# Patient Record
Sex: Male | Born: 1950 | Race: White | Hispanic: No | Marital: Married | Smoking: Former smoker
Health system: Southern US, Community
[De-identification: ages and names within clinical notes are randomized; demographics above are authoritative.]

## PROBLEM LIST (undated history)

## (undated) DIAGNOSIS — C649 Malignant neoplasm of unspecified kidney, except renal pelvis: Secondary | ICD-10-CM

## (undated) DIAGNOSIS — E785 Hyperlipidemia, unspecified: Secondary | ICD-10-CM

## (undated) DIAGNOSIS — R011 Cardiac murmur, unspecified: Secondary | ICD-10-CM

## (undated) DIAGNOSIS — E039 Hypothyroidism, unspecified: Secondary | ICD-10-CM

## (undated) DIAGNOSIS — N400 Enlarged prostate without lower urinary tract symptoms: Secondary | ICD-10-CM

## (undated) HISTORY — PX: KNEE SURGERY: SHX244

## (undated) HISTORY — PX: SPINE SURGERY: SHX786

## (undated) HISTORY — PX: TONSILECTOMY/ADENOIDECTOMY WITH MYRINGOTOMY: SHX6125

## (undated) HISTORY — PX: FEMUR SURGERY: SHX943

---

## 2014-03-23 DIAGNOSIS — N529 Male erectile dysfunction, unspecified: Secondary | ICD-10-CM | POA: Insufficient documentation

## 2016-11-22 DIAGNOSIS — R319 Hematuria, unspecified: Secondary | ICD-10-CM | POA: Insufficient documentation

## 2017-03-12 DIAGNOSIS — F418 Other specified anxiety disorders: Secondary | ICD-10-CM | POA: Insufficient documentation

## 2019-11-18 DIAGNOSIS — E785 Hyperlipidemia, unspecified: Secondary | ICD-10-CM | POA: Insufficient documentation

## 2019-11-18 DIAGNOSIS — N400 Enlarged prostate without lower urinary tract symptoms: Secondary | ICD-10-CM | POA: Insufficient documentation

## 2019-11-26 DIAGNOSIS — C649 Malignant neoplasm of unspecified kidney, except renal pelvis: Secondary | ICD-10-CM | POA: Insufficient documentation

## 2020-03-04 ENCOUNTER — Telehealth: Payer: Self-pay | Admitting: Hematology & Oncology

## 2020-03-04 NOTE — Telephone Encounter (Signed)
Called and left detailed message for patient regarding appointments scheduled.  I asked that he call back to confirm date & time . New patient packet has been mailed to his [place] address

## 2020-03-10 ENCOUNTER — Telehealth: Payer: Self-pay

## 2020-03-10 NOTE — Telephone Encounter (Signed)
Pt called in to cancel his New Pt ref as he has not moved her just yet, he feels that he will be here closer to May/June and he will call back to make a new appt at that time     Lotoya Casella

## 2020-04-05 ENCOUNTER — Telehealth: Payer: Self-pay

## 2020-04-05 NOTE — Telephone Encounter (Signed)
Pt called in to r/s his appt as she now has a confirmed move in date for the area    anne

## 2020-04-13 DIAGNOSIS — E039 Hypothyroidism, unspecified: Secondary | ICD-10-CM | POA: Insufficient documentation

## 2020-04-13 DIAGNOSIS — R6884 Jaw pain: Secondary | ICD-10-CM | POA: Insufficient documentation

## 2020-04-19 ENCOUNTER — Ambulatory Visit: Payer: Self-pay | Admitting: Hematology & Oncology

## 2020-04-19 ENCOUNTER — Other Ambulatory Visit: Payer: Self-pay

## 2020-05-11 DIAGNOSIS — R9389 Abnormal findings on diagnostic imaging of other specified body structures: Secondary | ICD-10-CM | POA: Insufficient documentation

## 2020-05-11 DIAGNOSIS — I959 Hypotension, unspecified: Secondary | ICD-10-CM | POA: Insufficient documentation

## 2020-05-31 ENCOUNTER — Encounter: Payer: Self-pay | Admitting: Hematology & Oncology

## 2020-05-31 ENCOUNTER — Inpatient Hospital Stay: Payer: Medicare Other

## 2020-05-31 ENCOUNTER — Inpatient Hospital Stay: Payer: Medicare Other | Attending: Hematology & Oncology | Admitting: Hematology & Oncology

## 2020-05-31 ENCOUNTER — Other Ambulatory Visit: Payer: Self-pay

## 2020-05-31 VITALS — BP 109/69 | HR 73 | Temp 98.2°F | Resp 18 | Ht 75.0 in | Wt 203.0 lb

## 2020-05-31 DIAGNOSIS — C7951 Secondary malignant neoplasm of bone: Secondary | ICD-10-CM | POA: Insufficient documentation

## 2020-05-31 DIAGNOSIS — Z87891 Personal history of nicotine dependence: Secondary | ICD-10-CM | POA: Diagnosis not present

## 2020-05-31 DIAGNOSIS — Z5112 Encounter for antineoplastic immunotherapy: Secondary | ICD-10-CM | POA: Insufficient documentation

## 2020-05-31 DIAGNOSIS — M25551 Pain in right hip: Secondary | ICD-10-CM | POA: Diagnosis not present

## 2020-05-31 DIAGNOSIS — C689 Malignant neoplasm of urinary organ, unspecified: Secondary | ICD-10-CM

## 2020-05-31 DIAGNOSIS — Z923 Personal history of irradiation: Secondary | ICD-10-CM | POA: Diagnosis not present

## 2020-05-31 DIAGNOSIS — C641 Malignant neoplasm of right kidney, except renal pelvis: Secondary | ICD-10-CM | POA: Insufficient documentation

## 2020-05-31 DIAGNOSIS — Z7189 Other specified counseling: Secondary | ICD-10-CM

## 2020-05-31 HISTORY — DX: Other specified counseling: Z71.89

## 2020-05-31 LAB — CBC WITH DIFFERENTIAL (CANCER CENTER ONLY)
Abs Immature Granulocytes: 0.02 10*3/uL (ref 0.00–0.07)
Basophils Absolute: 0 10*3/uL (ref 0.0–0.1)
Basophils Relative: 1 %
Eosinophils Absolute: 0.2 10*3/uL (ref 0.0–0.5)
Eosinophils Relative: 3 %
HCT: 34.2 % — ABNORMAL LOW (ref 39.0–52.0)
Hemoglobin: 11 g/dL — ABNORMAL LOW (ref 13.0–17.0)
Immature Granulocytes: 0 %
Lymphocytes Relative: 14 %
Lymphs Abs: 0.8 10*3/uL (ref 0.7–4.0)
MCH: 29.9 pg (ref 26.0–34.0)
MCHC: 32.2 g/dL (ref 30.0–36.0)
MCV: 92.9 fL (ref 80.0–100.0)
Monocytes Absolute: 0.4 10*3/uL (ref 0.1–1.0)
Monocytes Relative: 6 %
Neutro Abs: 4.5 10*3/uL (ref 1.7–7.7)
Neutrophils Relative %: 76 %
Platelet Count: 186 10*3/uL (ref 150–400)
RBC: 3.68 MIL/uL — ABNORMAL LOW (ref 4.22–5.81)
RDW: 17.5 % — ABNORMAL HIGH (ref 11.5–15.5)
WBC Count: 5.9 10*3/uL (ref 4.0–10.5)
nRBC: 0 % (ref 0.0–0.2)

## 2020-05-31 LAB — CMP (CANCER CENTER ONLY)
ALT: 11 U/L (ref 0–44)
AST: 8 U/L — ABNORMAL LOW (ref 15–41)
Albumin: 3.8 g/dL (ref 3.5–5.0)
Alkaline Phosphatase: 75 U/L (ref 38–126)
Anion gap: 5 (ref 5–15)
BUN: 26 mg/dL — ABNORMAL HIGH (ref 8–23)
CO2: 28 mmol/L (ref 22–32)
Calcium: 10.2 mg/dL (ref 8.9–10.3)
Chloride: 107 mmol/L (ref 98–111)
Creatinine: 0.77 mg/dL (ref 0.61–1.24)
GFR, Estimated: >60 mL/min (ref >60)
Glucose, Bld: 93 mg/dL (ref 70–99)
Potassium: 4.3 mmol/L (ref 3.5–5.1)
Sodium: 140 mmol/L (ref 135–145)
Total Bilirubin: 0.5 mg/dL (ref 0.3–1.2)
Total Protein: 6.1 g/dL — ABNORMAL LOW (ref 6.5–8.1)

## 2020-05-31 LAB — LACTATE DEHYDROGENASE: LDH: 148 U/L (ref 98–192)

## 2020-05-31 NOTE — Progress Notes (Signed)
Referral MD  Reason for Referral: Metastatic renal cell carcinoma-right kidney-bony metastasis  Chief Complaint  Patient presents with  . New Patient (Initial Visit)  : I just moved to [place].  I am transferring my care from [facility].  HPI: [name] is a very nice 70 year old white male.  He is originally from [place].  He comes in with his wife.  She is lovely and charming.  He has been quite healthy.  Apparently, he was diagnosed last year with metastatic renal cell carcinoma.  He initially presented with hematuria.  This was back in May 2021.  He was seen by urology.  Unfortunate, nothing could be found on multiple exams.  In September 2021, he had more follow-up.  He had urine cytology which was unremarkable.  However, in October 2021, he had a CT scan which showed a lesion in the right parasymphyseal region.  This measured 4 cm.  There was a moderate soft tissue component.  Also noted was a suspicious lesion in the anterior right femoral neck.  He is having a lot of pain in the right hip.  This was with weightbearing.  He subsequently was seen by medical oncology.  He underwent a biopsy which was felt to be consistent with a renal cell carcinoma.  Again there was no tissue to be able to further identify this.  It was a poorly differentiated carcinoma.  He had a PET scan done.  The PET scan was quite positive for bony metastasis.  He was seen by orthopedic surgery.  Did not feel that he required any initial surgery for the right hip area.  He was given radiation therapy.  He was started on immunotherapy with nivolumab and ipilimumab.  He had 4 cycles of both agents and then has been on single agent nivolumab.  He is tolerated this quite well.  In November, he was seen by spinal surgery.  He had a lesion at T5.  He was having some radicular pain.  There is no weakness.  However, it was felt that he was a significant risk for cord compression.  He subsequently underwent a T5-6  corpectomy and a T3-T7 fusion.  This was done on 01/15/2020.  He got through the surgery quite nicely.  Most recently, he had surgery to fix a cortical lesion in the right femur.  This was done on 05/13/2020.  The surgeon did a remarkable job.  There is such a small incision over on the right lateral thigh.  Again, he has had multiple radiation treatments to his bones.  It is apparent that his disease really has a predilection for his bones.  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  This was in the mandibular area.  He has been seen by periodontist.  Is been seen by oral surgery.  He still has his lesion.  We will have to see about referring him to our dentist.  He and his wife are now moved to the [place] area.  They are transferring their care to the [facility].  He looks fantastic.  He feels good.  Has had no problems with the immunotherapy.  He has had no problems with cough or shortness of breath.  There is been no nausea or vomiting.  He has had some fatigue.  He has had no count of rashes.  He has had no headache.  Overall, I  would have to say his performance status is ECOG 1.   History reviewed. No pertinent past medical history.:  History reviewed. No pertinent surgical history.:   Current Outpatient Medications:  .  ascorbic acid (VITAMIN C) 500 MG tablet, Take 500 mg by mouth in the morning., Disp: , Rfl:  .  guaiFENesin (MUCINEX) 600 MG 12 hr tablet, Take 600 mg by mouth in the morning and at bedtime., Disp: , Rfl:  .  senna-docusate (SENOKOT-S) 8.6-50 MG tablet, Take by mouth., Disp: , Rfl:  .  vitamin E 180 MG (400 UNITS) capsule, Take 400 Units by mouth every morning., Disp: , Rfl:  .  acetaminophen (TYLENOL) 325 MG tablet, Take 975 mg by mouth every 8 (eight) hours., Disp: , Rfl:  .  alfuzosin (UROXATRAL) 10 MG 24 hr tablet, Take 10 mg by mouth at bedtime., Disp:  , Rfl:  .  ASPIRIN LOW DOSE 81 MG EC tablet, Take 81 mg by mouth in the morning and at bedtime., Disp: , Rfl:  .  escitalopram (LEXAPRO) 20 MG tablet, Take 1 tablet by mouth daily., Disp: , Rfl:  .  finasteride (PROSCAR) 5 MG tablet, Take 5 mg by mouth daily., Disp: , Rfl:  .  Hypertonic Nasal Wash (SINUS RINSE) PACK, Irrigate with as directed., Disp: , Rfl:  .  ipilimumab (YERVOY) 200 MG/40ML SOLN, Inject into the vein., Disp: , Rfl:  .  LORazepam (ATIVAN) 1 MG tablet, Take 1 mg by mouth once., Disp: , Rfl:  .  magic mouthwash (lidocaine, diphenhydrAMINE, alum & mag hydroxide) suspension, Swish and spit 5 mLs every 6 (six) hours as needed for Pain, Disp: , Rfl:  .  MAGNESIUM CARBONATE PO, Take 325 mg by mouth daily., Disp: , Rfl:  .  melatonin 3 MG TABS tablet, Take 3 mg by mouth at bedtime., Disp: , Rfl:  .  midodrine (PROAMATINE) 2.5 MG tablet, Take 2.5 mg by mouth 3 (three) times daily., Disp: , Rfl:  .  montelukast (SINGULAIR) 10 MG tablet, Take 1 tablet by mouth daily., Disp: , Rfl:  .  Multiple Vitamin (MULTI-VITAMINS) TABS, Take 1 tablet by mouth daily., Disp: , Rfl:  .  nivolumab (OPDIVO) 100 MG/10ML SOLN chemo injection, Inject into the vein., Disp: , Rfl:  .  Omega-3 Fatty Acids (FISH OIL PO), Take 5 mLs by mouth daily., Disp: , Rfl:  .  ondansetron (ZOFRAN-ODT) 4 MG disintegrating tablet, Take 4 mg by mouth every 8 (eight) hours as needed., Disp: , Rfl:  .  polyethylene glycol powder (GLYCOLAX/MIRALAX) 17 GM/SCOOP powder, Take 17 g by mouth at bedtime., Disp: , Rfl:  .  Potassium Citrate-Citric Acid 3300-1002 MG PACK, Take by mouth., Disp: , Rfl:  .  predniSONE (DELTASONE) 10 MG tablet, Take 10 mg by mouth daily., Disp: , Rfl:  .  prochlorperazine (COMPAZINE) 10 MG tablet, Take 10 mg by mouth every 6 (six) hours as needed., Disp: , Rfl:  .  propranolol (INDERAL) 10 MG tablet, Take 10 mg by mouth daily as needed., Disp: , Rfl: :  :  Allergies  Allergen Reactions  . Morphine  Nausea Only  :  History reviewed. No pertinent family history.:  Social History   Socioeconomic History  . Marital status: Married    Spouse name: Not on file  . Number of children: Not on file  . Years of education: Not on file  . Highest education level: Not on file  Occupational History  . Not on file  Tobacco Use  .  Smoking status: Former Smoker    Packs/day: 0.50    Years: 25.00    Pack years: 12.50    Types: Cigarettes    Quit date: 01/27/1997    Years since quitting: 23.3  . Smokeless tobacco: Never Used  Substance and Sexual Activity  . Alcohol use: Not on file  . Drug use: Not on file  . Sexual activity: Not on file  Other Topics Concern  . Not on file  Social History Narrative  . Not on file   Social Determinants of Health   Financial Resource Strain: Not on file  Food Insecurity: Not on file  Transportation Needs: Not on file  Physical Activity: Not on file  Stress: Not on file  Social Connections: Not on file  Intimate Partner Violence: Not on file  :  Review of Systems  Constitutional: Negative.   HENT: Negative.  Negative for hearing loss.   Eyes: Negative.   Respiratory: Negative.   Cardiovascular: Negative.   Gastrointestinal: Negative.   Genitourinary: Negative.   Musculoskeletal: Negative.   Skin: Negative.   Neurological: Negative.   Endo/Heme/Allergies: Negative.   Psychiatric/Behavioral: Negative.     Exam:  This is a well-developed and well-nourished white male in no obvious distress.  Vital signs are temperature of 98.2.  Pulse 73.  Blood pressure 109/69.  Weight is 203 pounds.  Head and neck exam shows no ocular or oral lesions.  He has no palpable cervical or supraclavicular lymph nodes.  Lungs are clear bilaterally.  Cardiac exam regular rate and rhythm with no murmurs, rubs or bruits.  There may be 1/6 systolic ejection murmur.  Abdomen is soft.  Has good bowel sounds.  There is no fluid wave.  There is no palpable liver or  spleen tip.  Back exam shows the laminectomy scar in the upper thoracic spine.  This is well-healed.  He has no tenderness over the spine, ribs or hips.  Extremities shows no clubbing, cyanosis or edema.  He has good range of motion of his joints.  He has good strength in upper and lower extremities.  Neurological exam shows no focal neurological deficits.  Skin exam shows no rashes, ecchymoses or petechia.   @IPVITALS @   Recent Labs    05/31/20 1047  WBC 5.9  HGB 11.0*  HCT 34.2*  PLT 186   Recent Labs    05/31/20 1047  NA 140  K 4.3  CL 107  CO2 28  GLUCOSE 93  BUN 26*  CREATININE 0.77  CALCIUM 10.2    Blood smear review: None  Pathology: None  Assessment and Plan: Mr. Enderle is a very nice 70 year old white male.  He has metastatic renal cell carcinoma.  He is doing incredibly well with this.  I must say that he has had incredibly aggressive care.  He has had multiple surgeries.  He has had radiation therapy.  He has had immunotherapy.  Clearly, this is all helped him.  He has had a great quality of life.  It seems like he has responded very nicely.  We will continue him on the nivolumab.  This certainly can have a long-lasting effect which should be quite beneficial for him.  Will have to make the referral for dentistry.  No much was going on with his mouth and this ulcer.  I am sure our dentist will be able to help [place] out.  He was on Xgeva.  We are holding this until we know exactly what is going on  with his mouth.  Again, he looks quite good.  He and his wife are incredibly eloquent.  It was a lot of fun talking with him.  We will start the nivolumab next week.  This will be every 4 weeks.  If we see that he is responding, then we might be able to move his infusions out a little bit longer.  He still goes back to [facility] to see the surgeons.  This I think is a fantastic idea.  I will plan to see him back myself when he has his second round of the nivolumab.  I will  have to figure out when we need to do another set of scans on him.

## 2020-06-01 ENCOUNTER — Telehealth: Payer: Self-pay

## 2020-06-01 NOTE — Telephone Encounter (Signed)
Called and left a vm with appts per 05/31/20 los   Francis Cunningham

## 2020-06-03 ENCOUNTER — Telehealth (HOSPITAL_COMMUNITY): Payer: Self-pay

## 2020-06-03 NOTE — Telephone Encounter (Signed)
I left message on patients answering machine to return call to Dental Medicine to schedule an appt at the request of Dr. Marin Olp.

## 2020-06-07 ENCOUNTER — Inpatient Hospital Stay: Payer: Medicare Other

## 2020-06-07 ENCOUNTER — Other Ambulatory Visit: Payer: Self-pay

## 2020-06-07 VITALS — BP 98/60 | HR 80 | Temp 98.2°F | Resp 18

## 2020-06-07 DIAGNOSIS — C641 Malignant neoplasm of right kidney, except renal pelvis: Secondary | ICD-10-CM

## 2020-06-07 DIAGNOSIS — Z5112 Encounter for antineoplastic immunotherapy: Secondary | ICD-10-CM | POA: Diagnosis not present

## 2020-06-07 LAB — CBC WITH DIFFERENTIAL (CANCER CENTER ONLY)
Abs Immature Granulocytes: 0.01 10*3/uL (ref 0.00–0.07)
Basophils Absolute: 0 10*3/uL (ref 0.0–0.1)
Basophils Relative: 1 %
Eosinophils Absolute: 0.3 10*3/uL (ref 0.0–0.5)
Eosinophils Relative: 5 %
HCT: 36.8 % — ABNORMAL LOW (ref 39.0–52.0)
Hemoglobin: 11.8 g/dL — ABNORMAL LOW (ref 13.0–17.0)
Immature Granulocytes: 0 %
Lymphocytes Relative: 15 %
Lymphs Abs: 1 10*3/uL (ref 0.7–4.0)
MCH: 29.9 pg (ref 26.0–34.0)
MCHC: 32.1 g/dL (ref 30.0–36.0)
MCV: 93.4 fL (ref 80.0–100.0)
Monocytes Absolute: 0.6 10*3/uL (ref 0.1–1.0)
Monocytes Relative: 8 %
Neutro Abs: 4.9 10*3/uL (ref 1.7–7.7)
Neutrophils Relative %: 71 %
Platelet Count: 194 10*3/uL (ref 150–400)
RBC: 3.94 MIL/uL — ABNORMAL LOW (ref 4.22–5.81)
RDW: 17.9 % — ABNORMAL HIGH (ref 11.5–15.5)
WBC Count: 6.8 10*3/uL (ref 4.0–10.5)
nRBC: 0 % (ref 0.0–0.2)

## 2020-06-07 LAB — CMP (CANCER CENTER ONLY)
ALT: 11 U/L (ref 0–44)
AST: 8 U/L — ABNORMAL LOW (ref 15–41)
Albumin: 4 g/dL (ref 3.5–5.0)
Alkaline Phosphatase: 73 U/L (ref 38–126)
Anion gap: 4 — ABNORMAL LOW (ref 5–15)
BUN: 29 mg/dL — ABNORMAL HIGH (ref 8–23)
CO2: 29 mmol/L (ref 22–32)
Calcium: 10.9 mg/dL — ABNORMAL HIGH (ref 8.9–10.3)
Chloride: 107 mmol/L (ref 98–111)
Creatinine: 0.87 mg/dL (ref 0.61–1.24)
GFR, Estimated: >60 mL/min (ref >60)
Glucose, Bld: 111 mg/dL — ABNORMAL HIGH (ref 70–99)
Potassium: 4.3 mmol/L (ref 3.5–5.1)
Sodium: 140 mmol/L (ref 135–145)
Total Bilirubin: 0.5 mg/dL (ref 0.3–1.2)
Total Protein: 6.2 g/dL — ABNORMAL LOW (ref 6.5–8.1)

## 2020-06-07 LAB — LACTATE DEHYDROGENASE: LDH: 135 U/L (ref 98–192)

## 2020-06-07 MED ORDER — SODIUM CHLORIDE 0.9 % IV SOLN
480.0000 mg | Freq: Once | INTRAVENOUS | Status: AC
  Administered 2020-06-07: 480 mg via INTRAVENOUS
  Filled 2020-06-07: qty 48

## 2020-06-07 MED ORDER — SODIUM CHLORIDE 0.9 % IV SOLN
Freq: Once | INTRAVENOUS | Status: AC
Start: 2020-06-07 — End: 2020-06-07
  Filled 2020-06-07: qty 250

## 2020-06-07 NOTE — Patient Instructions (Signed)
[facility]  Discharge Instructions: Thank you for choosing [facility] to provide your oncology and hematology care.   If you have a lab appointment with the [facility], please go directly to the [facility] and check in at the registration area.  Wear comfortable clothing and clothing appropriate for easy access to any Portacath or PICC line.   We strive to give you quality time with your provider. You may need to reschedule your appointment if you arrive late (15 or more minutes).  Arriving late affects you and other patients whose appointments are after yours.  Also, if you miss three or more appointments without notifying the office, you may be dismissed from the clinic at the provider's discretion.      For prescription refill requests, have your pharmacy contact our office and allow 72 hours for refills to be completed.    Today you received the following chemotherapy and/or immunotherapy agents opdivo    To help prevent nausea and vomiting after your treatment, we encourage you to take your nausea medication as directed.  BELOW ARE SYMPTOMS THAT SHOULD BE REPORTED IMMEDIATELY: . *FEVER GREATER THAN 100.4 F (38 C) OR HIGHER . *CHILLS OR SWEATING . *NAUSEA AND VOMITING THAT IS NOT CONTROLLED WITH YOUR NAUSEA MEDICATION . *UNUSUAL SHORTNESS OF BREATH . *UNUSUAL BRUISING OR BLEEDING . *URINARY PROBLEMS (pain or burning when urinating, or frequent urination) . *BOWEL PROBLEMS (unusual diarrhea, constipation, pain near the anus) . TENDERNESS IN MOUTH AND THROAT WITH OR WITHOUT PRESENCE OF ULCERS (sore throat, sores in mouth, or a toothache) . UNUSUAL RASH, SWELLING OR PAIN  . UNUSUAL VAGINAL DISCHARGE OR ITCHING   Items with * indicate a potential emergency and should be followed up as soon as possible or go to the Emergency Department if any problems should occur.  Please show the CHEMOTHERAPY ALERT CARD or IMMUNOTHERAPY ALERT CARD at  check-in to the Emergency Department and triage nurse. Should you have questions after your visit or need to cancel or reschedule your appointment, please contact [facility]  [phone] and follow the prompts.  Office hours are 8:00 a.m. to 4:30 p.m. Monday - Friday. Please note that voicemails left after 4:00 p.m. may not be returned until the following business day.  We are closed weekends and major holidays. You have access to a nurse at all times for urgent questions. Please call the main number to the clinic [phone] and follow the prompts.  For any non-urgent questions, you may also contact your provider using MyChart. We now offer e-Visits for anyone 72 and older to request care online for non-urgent symptoms. For details visit [email or web address].   Also download the MyChart app! Go to the app store, search "MyChart", open the app, select [facility], and log in with your MyChart username and password.  Due to Covid, a mask is required upon entering the hospital/clinic. If you do not have a mask, one will be given to you upon arrival. For doctor visits, patients may have 1 support person aged 72 or older with them. For treatment visits, patients cannot have anyone with them due to current Covid guidelines and our immunocompromised population. Nivolumab injection What is this medicine? NIVOLUMAB (nye VOL ue mab) is a monoclonal antibody. It treats certain types of cancer. Some of the cancers treated are colon cancer, head and neck cancer, Hodgkin lymphoma, lung cancer, and melanoma. This medicine may be used for other purposes;  ask your health care provider or pharmacist if you have questions. COMMON BRAND NAME(S): Opdivo What should I tell my health care provider before I take this medicine? They need to know if you have any of these conditions:  autoimmune diseases like Crohn's disease, ulcerative colitis, or lupus  have had or planning to have an  allogeneic stem cell transplant (uses someone else's stem cells)  history of chest radiation  history of organ transplant  nervous system problems like myasthenia gravis or Guillain-Barre syndrome  an unusual or allergic reaction to nivolumab, other medicines, foods, dyes, or preservatives  pregnant or trying to get pregnant  breast-feeding How should I use this medicine? This medicine is for infusion into a vein. It is given by a health care professional in a hospital or clinic setting. A special MedGuide will be given to you before each treatment. Be sure to read this information carefully each time. Talk to your pediatrician regarding the use of this medicine in children. While this drug may be prescribed for children as young as 12 years for selected conditions, precautions do apply. Overdosage: If you think you have taken too much of this medicine contact a poison control center or emergency room at once. NOTE: This medicine is only for you. Do not share this medicine with others. What if I miss a dose? It is important not to miss your dose. Call your doctor or health care professional if you are unable to keep an appointment. What may interact with this medicine? Interactions have not been studied. This list may not describe all possible interactions. Give your health care provider a list of all the medicines, herbs, non-prescription drugs, or dietary supplements you use. Also tell them if you smoke, drink alcohol, or use illegal drugs. Some items may interact with your medicine. What should I watch for while using this medicine? This drug may make you feel generally unwell. Continue your course of treatment even though you feel ill unless your doctor tells you to stop. You may need blood work done while you are taking this medicine. Do not become pregnant while taking this medicine or for 5 months after stopping it. Women should inform their doctor if they wish to become pregnant or  think they might be pregnant. There is a potential for serious side effects to an unborn child. Talk to your health care professional or pharmacist for more information. Do not breast-feed an infant while taking this medicine or for 5 months after stopping it. What side effects may I notice from receiving this medicine? Side effects that you should report to your doctor or health care professional as soon as possible:  allergic reactions like skin rash, itching or hives, swelling of the face, lips, or tongue  breathing problems  blood in the urine  bloody or watery diarrhea or black, tarry stools  changes in emotions or moods  changes in vision  chest pain  cough  dizziness  feeling faint or lightheaded, falls  fever, chills  headache with fever, neck stiffness, confusion, loss of memory, sensitivity to light, hallucination, loss of contact with reality, or seizures  joint pain  mouth sores  redness, blistering, peeling or loosening of the skin, including inside the mouth  severe muscle pain or weakness  signs and symptoms of high blood sugar such as dizziness; dry mouth; dry skin; fruity breath; nausea; stomach pain; increased hunger or thirst; increased urination  signs and symptoms of kidney injury like trouble passing urine or change  in the amount of urine  signs and symptoms of liver injury like dark yellow or brown urine; general ill feeling or flu-like symptoms; light-colored stools; loss of appetite; nausea; right upper belly pain; unusually weak or tired; yellowing of the eyes or skin  swelling of the ankles, feet, hands  trouble passing urine or change in the amount of urine  unusually weak or tired  weight gain or loss Side effects that usually do not require medical attention (report to your doctor or health care professional if they continue or are bothersome):  bone pain  constipation  decreased appetite  diarrhea  muscle pain  nausea,  vomiting  tiredness This list may not describe all possible side effects. Call your doctor for medical advice about side effects. You may report side effects to FDA at 1-800-FDA-1088. Where should I keep my medicine? This drug is given in a hospital or clinic and will not be stored at home. NOTE: This sheet is a summary. It may not cover all possible information. If you have questions about this medicine, talk to your doctor, pharmacist, or health care provider.  2021 Elsevier/Gold Standard (2019-05-20 10:08:25)

## 2020-06-14 ENCOUNTER — Ambulatory Visit (INDEPENDENT_AMBULATORY_CARE_PROVIDER_SITE_OTHER): Payer: Self-pay | Admitting: Dentistry

## 2020-06-14 ENCOUNTER — Encounter (HOSPITAL_COMMUNITY): Payer: Self-pay | Admitting: Dentistry

## 2020-06-14 ENCOUNTER — Other Ambulatory Visit: Payer: Self-pay

## 2020-06-14 DIAGNOSIS — R6884 Jaw pain: Secondary | ICD-10-CM

## 2020-06-14 DIAGNOSIS — Z0120 Encounter for dental examination and cleaning without abnormal findings: Secondary | ICD-10-CM

## 2020-06-14 DIAGNOSIS — M87180 Osteonecrosis due to drugs, jaw: Secondary | ICD-10-CM

## 2020-06-14 DIAGNOSIS — C641 Malignant neoplasm of right kidney, except renal pelvis: Secondary | ICD-10-CM

## 2020-06-14 MED ORDER — LIDOCAINE VISCOUS HCL 2 % MT SOLN: 15.0000 mL | Freq: Four times a day (QID) | OROMUCOSAL | 2 refills | Status: AC | PRN

## 2020-06-14 MED ORDER — CHLORHEXIDINE GLUCONATE 0.12 % MT SOLN: 15.0000 mL | Freq: Four times a day (QID) | OROMUCOSAL | 2 refills | Status: DC

## 2020-06-14 NOTE — Progress Notes (Signed)
Department of Dental Medicine    LIMITED EXAM  Service Date:   06/14/2020  Patient Name:  Francis Cunningham Date of Birth:   11-Sep-1950 Medical Record Number: [id number]  Referring Provider:             Burney Gauze, MD    ASSESSMENT & PLAN  RECOMMENDATIONS 1. Findings:  There are no current signs of acute dental infection including abscess, edema or erythema.  There is an area of exposed bone on the lower left which has been there > 8 weeks and consistent with MRONJ diagnosis.    2. Plan:    Conservative and palliative treatment with chlorhexidine mouthrinse and viscous lidocaine since there is no sign of infection or purulence.  Rx written today for both.  Follow-up with patient in 4 weeks for reevaluation. Will continue with conservative care and close monitoring for now if ONJ is not worsening.  Discuss case with medical/oncology team and coordinate treatment as needed.    Discussed in detail all treatment options with the patient and they are agreeable to the plan.   Thank you for consulting with [facility] and for the opportunity to participate in this patient's treatment.  Should you have any questions or concerns, please contact the [facility] at [phone].  PROGRESS NOTE   COVID 19 SCREENING: The patient denies symptoms concerning for COVID-19 infection including fever, chills, cough, or newly developed shortness of breath.    HISTORY OF PRESENT ILLNESS:   Francis Cunningham is a very pleasant 70 y.o. male with h/o HTN, hypothyroidism, hyperlipidemia and metastatic renal cell carcinoma with h/o Xgeva and currently undergoing immunotherapy (nivolumab) who presents today with his wife for a dental consultation to evaluate a persistent mouth ulceration with concern for osteonecrosis.     DENTAL HISTORY:  The patient reports he noticed the area of concern in January of this year.  When it started to develop, he thought it was a typical mouth ulcer, but it  became extremely painful and so he informed his dentist about it.  His dentist referred him to a periodontist for further assessment and Delton See was also stopped due to concern for MRONJ.  He had only 2 treatments with Xgeva when it was stopped.  He reports that at that time the lesion was only about 2x3 mm, and when he saw the periodontist they did a mini-flap procedure and removed some small flakes of necrotic bone and sutured it closed.  He states that the area never completely closed or healed and was given magic mouthwash and another topical anesthetic gel for pain, and went through several rounds of systemic antibiotics (doxycycline).  The pain had improved briefly after this initial treatment, however now it has progressively gotten worse and he feels the same throbbing pain in the lower left.  He states that he takes the maximum dosage of Tylenol every 4-6 hours for pain since he cannot take Ibuprofen.  He states that the Tylenol provides relief for a little while, but wears off.  He states that he has not had any other areas in his mouth with this same condition.  Patient is able to manage oral secretions.  Patient denies dysphagia, odynophagia, dysphonia, SOB and neck pain.  Patient denies fever, rigors and malaise.    CHIEF COMPLAINT:    Persistent mouth pain and ulceration localized to the lower right quadrant surrounding tooth #31 (points to #31 and surrounding area).   Patient Active Problem List   Diagnosis Date Noted  .  Goals of care, counseling/discussion 05/31/2020  . Abnormal findings on diagnostic imaging of other specified body structures 05/11/2020  . Arterial hypotension 05/11/2020  . Acquired hypothyroidism 04/13/2020  . Jaw pain 04/13/2020  . Metastatic renal cell carcinoma ([place]) 11/26/2019  . BPH (benign prostatic hyperplasia) 11/18/2019  . HLD (hyperlipidemia) 11/18/2019  . Hypercalcemia of malignancy 11/13/2019  . Depression with anxiety 03/12/2017  . Hematuria  11/22/2016  . Male erectile dysfunction, unspecified 03/23/2014   Past Medical History:  Diagnosis Date  . Goals of care, counseling/discussion 05/31/2020   History reviewed. No pertinent surgical history. Allergies  Allergen Reactions  . Morphine Nausea Only   Current Outpatient Medications  Medication Sig Dispense Refill  . acetaminophen (TYLENOL) 325 MG tablet Take 975 mg by mouth every 8 (eight) hours.    Marland Kitchen alfuzosin (UROXATRAL) 10 MG 24 hr tablet Take 10 mg by mouth at bedtime.    Marland Kitchen ascorbic acid (VITAMIN C) 500 MG tablet Take 500 mg by mouth in the morning.    . ASPIRIN LOW DOSE 81 MG EC tablet Take 81 mg by mouth in the morning and at bedtime.    Marland Kitchen escitalopram (LEXAPRO) 20 MG tablet Take 1 tablet by mouth daily.    . finasteride (PROSCAR) 5 MG tablet Take 5 mg by mouth daily.    Marland Kitchen guaiFENesin (MUCINEX) 600 MG 12 hr tablet Take 600 mg by mouth in the morning and at bedtime.    . Hypertonic Nasal Wash (SINUS RINSE) PACK Irrigate with as directed.    Marland Kitchen ipilimumab (YERVOY) 200 MG/40ML SOLN Inject into the vein.    Marland Kitchen LORazepam (ATIVAN) 1 MG tablet Take 1 mg by mouth once.    . magic mouthwash (lidocaine, diphenhydrAMINE, alum & mag hydroxide) suspension Swish and spit 5 mLs every 6 (six) hours as needed for Pain    . MAGNESIUM CARBONATE PO Take 325 mg by mouth daily.    . melatonin 3 MG TABS tablet Take 3 mg by mouth at bedtime.    . midodrine (PROAMATINE) 2.5 MG tablet Take 2.5 mg by mouth 3 (three) times daily.    . montelukast (SINGULAIR) 10 MG tablet Take 1 tablet by mouth daily.    . Multiple Vitamin (MULTI-VITAMINS) TABS Take 1 tablet by mouth daily.    . nivolumab (OPDIVO) 100 MG/10ML SOLN chemo injection Inject into the vein.    . Omega-3 Fatty Acids (FISH OIL PO) Take 5 mLs by mouth daily.    . ondansetron (ZOFRAN-ODT) 4 MG disintegrating tablet Take 4 mg by mouth every 8 (eight) hours as needed.    . polyethylene glycol powder (GLYCOLAX/MIRALAX) 17 GM/SCOOP powder Take 17  g by mouth at bedtime.    . Potassium Citrate-Citric Acid 3300-1002 MG PACK Take by mouth.    . predniSONE (DELTASONE) 10 MG tablet Take 10 mg by mouth daily.    . prochlorperazine (COMPAZINE) 10 MG tablet Take 10 mg by mouth every 6 (six) hours as needed.    . propranolol (INDERAL) 10 MG tablet Take 10 mg by mouth daily as needed.    . senna-docusate (SENOKOT-S) 8.6-50 MG tablet Take by mouth.    . vitamin E 180 MG (400 UNITS) capsule Take 400 Units by mouth every morning.     No current facility-administered medications for this visit.    LABS: Lab Results  Component Value Date   WBC 6.8 06/07/2020   HGB 11.8 (L) 06/07/2020   HCT 36.8 (L) 06/07/2020   MCV 93.4 06/07/2020  PLT 194 06/07/2020      Component Value Date/Time   NA 140 06/07/2020 0952   K 4.3 06/07/2020 0952   CL 107 06/07/2020 0952   CO2 29 06/07/2020 0952   GLUCOSE 111 (H) 06/07/2020 0952   BUN 29 (H) 06/07/2020 0952   CREATININE 0.87 06/07/2020 0952   CALCIUM 10.9 (H) 06/07/2020 0952   GFRNONAA >60 06/07/2020 0952   No results found for: INR, PROTIME No results found for: PTT  Social History   Socioeconomic History  . Marital status: Married    Spouse name: Not on file  . Number of children: Not on file  . Years of education: Not on file  . Highest education level: Not on file  Occupational History  . Not on file  Tobacco Use  . Smoking status: Former Smoker    Packs/day: 0.50    Years: 25.00    Pack years: 12.50    Types: Cigarettes    Quit date: 01/27/1997    Years since quitting: 23.3  . Smokeless tobacco: Never Used  Substance and Sexual Activity  . Alcohol use: Not on file  . Drug use: Not on file  . Sexual activity: Not on file  Other Topics Concern  . Not on file  Social History Narrative  . Not on file   Social Determinants of Health   Financial Resource Strain: Not on file  Food Insecurity: Not on file  Transportation Needs: Not on file  Physical Activity: Not on file   Stress: Not on file  Social Connections: Not on file  Intimate Partner Violence: Not on file   History reviewed. No pertinent family history.    REVIEW OF SYSTEMS:   Reviewed with the patient as per HPI. Psych: Patient denies having dental phobia.    VITAL SIGNS: BP 105/64 (BP Location: Right Arm)   Pulse 71   Temp 98.3 F (36.8 C) (Oral)     PHYSICAL EXAM: General:  Well-developed, comfortable and in no apparent distress. Neurological:  Alert and oriented to person, place and  time. Extraoral:  Facial symmetry present without any edema or erythema.  No swelling or lymphadenopathy.  TMJ asymptomatic without clicks or crepitations. (+) Bruxism. Intraoral:  Soft tissues appear well-perfused and mucous membranes moist.  FOM and vestibules soft and not raised. No signs of infection, parulis, sinus tract or edema evident upon exam.  (+) 8 x 3 mm region of exposed bone on the lingual side, right posterior mandible; no signs of edema or purulence/purulent exudate coming from the surrounding mucosa, some erythema surrounding the margin of the mucosa touching bone.    DENTAL EXAM:   (Limited) hard tissue exam completed and charted.  Dentition:  Overall good remaining dentition.  Missing teeth, caries, retained root tips, existing restorations and crowns.   Oral hygiene:  Good   Periodontal:  Pink, healthy gingival tissue with blunted papilla.  Generalized gingival recession. Removable/Fixed Prosthodontics:  Multiple posterior teeth with full-coverage crowns Occlusion:  Class I molar occlusion.  He has a maxillary night guard he wears at night.    RADIOGRAPHIC EXAM:   PAN exposed and interpreted.  >> Condyles seated bilaterally in fossas. No evidence of abnormal pathology.  All visualized osseous structures appear WNL. Missing teeth, moderately restored remaining dentition with multiple full-coverage crowns and restorations. #2, #15, #30 and #31 have been endodontically treated  with definitive crowns. #31 appears to have a radiopaque material filling the canals; fill appears adequate in what is visualized on  radiograph    ASSESSMENT:  1. Malignant renal cell carcinoma 2. History of anti-resorptive therapy (Xgeva) 3. Encounter for dental examination 4. Missing teeth 5. Gingival recession 6. 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  I discussed the risks, benefits, and complications of various scenarios with the patient in relationship to their medical and dental conditions.  I explained that although his Delton See has been stopped, small amounts of the drug are still in his system and along with his immunotherapy and systemic steroid therapy this can make regions of ONJ that are medicine-induced difficult to treat curatively (at least immediately or while the patient is still undergoing treatment) and it is recommended to manage with palliative and conservative care unless there is no other option.  I explained all significant findings of the dental consultation with the patient including the area in his lower jaw with exposed bone and the recommended care in his case including conservative treatment with Chlorhexidine gluconate rinses 3-4 times a day for 4 weeks and following-up in our clinic around the same time (4 weeks) to monitor the area for signs of worsening pain or clinical signs of infection and/or expansion of exposed bone.  The patient verbalized understanding of all findings, discussion, and recommendations. 2.  We then discussed various treatment options to include no treatment, periodontal therapy, dental restorations, root canal therapy, crown and bridge therapy, implant therapy, and replacement of missing teeth as indicated in relation to his medical and dental conditions.  The patient verbalized understanding of all options, and currently wishes to proceed with conservative treatment and return in about 4 weeks for a follow-up  appointment.  Since he currently has significant pain in the area of concern, Rx written for Peridex and viscous lidocaine rinses to use by mouth (swish and spit). 3.  Plan to discuss all findings and recommendations with medical team and coordinate future care as needed.    The patient tolerated today's visit well.  All questions and concerns were addressed and answered, and the patient departed in stable condition.    Jessup Benson Norway, D.M.D.

## 2020-07-04 ENCOUNTER — Other Ambulatory Visit: Payer: Self-pay | Admitting: *Deleted

## 2020-07-04 DIAGNOSIS — C641 Malignant neoplasm of right kidney, except renal pelvis: Secondary | ICD-10-CM

## 2020-07-04 DIAGNOSIS — C689 Malignant neoplasm of urinary organ, unspecified: Secondary | ICD-10-CM

## 2020-07-05 ENCOUNTER — Inpatient Hospital Stay (HOSPITAL_BASED_OUTPATIENT_CLINIC_OR_DEPARTMENT_OTHER): Payer: Medicare Other | Admitting: Hematology & Oncology

## 2020-07-05 ENCOUNTER — Other Ambulatory Visit: Payer: Self-pay

## 2020-07-05 ENCOUNTER — Encounter: Payer: Self-pay | Admitting: Hematology & Oncology

## 2020-07-05 ENCOUNTER — Telehealth: Payer: Self-pay

## 2020-07-05 ENCOUNTER — Inpatient Hospital Stay: Payer: Medicare Other

## 2020-07-05 ENCOUNTER — Inpatient Hospital Stay: Payer: Medicare Other | Attending: Hematology & Oncology

## 2020-07-05 VITALS — BP 101/67 | HR 73 | Temp 97.6°F | Resp 20 | Ht 75.0 in | Wt 212.0 lb

## 2020-07-05 DIAGNOSIS — Z7952 Long term (current) use of systemic steroids: Secondary | ICD-10-CM | POA: Diagnosis not present

## 2020-07-05 DIAGNOSIS — M549 Dorsalgia, unspecified: Secondary | ICD-10-CM | POA: Diagnosis not present

## 2020-07-05 DIAGNOSIS — C641 Malignant neoplasm of right kidney, except renal pelvis: Secondary | ICD-10-CM | POA: Diagnosis present

## 2020-07-05 DIAGNOSIS — Z87891 Personal history of nicotine dependence: Secondary | ICD-10-CM | POA: Diagnosis not present

## 2020-07-05 DIAGNOSIS — E2740 Unspecified adrenocortical insufficiency: Secondary | ICD-10-CM | POA: Diagnosis not present

## 2020-07-05 DIAGNOSIS — Z923 Personal history of irradiation: Secondary | ICD-10-CM | POA: Diagnosis not present

## 2020-07-05 DIAGNOSIS — C7951 Secondary malignant neoplasm of bone: Secondary | ICD-10-CM | POA: Diagnosis present

## 2020-07-05 DIAGNOSIS — C649 Malignant neoplasm of unspecified kidney, except renal pelvis: Secondary | ICD-10-CM | POA: Insufficient documentation

## 2020-07-05 DIAGNOSIS — C689 Malignant neoplasm of urinary organ, unspecified: Secondary | ICD-10-CM

## 2020-07-05 DIAGNOSIS — M2550 Pain in unspecified joint: Secondary | ICD-10-CM | POA: Diagnosis not present

## 2020-07-05 DIAGNOSIS — Z5112 Encounter for antineoplastic immunotherapy: Secondary | ICD-10-CM | POA: Insufficient documentation

## 2020-07-05 LAB — CMP (CANCER CENTER ONLY)
ALT: 14 U/L (ref 0–44)
AST: 9 U/L — ABNORMAL LOW (ref 15–41)
Albumin: 3.9 g/dL (ref 3.5–5.0)
Alkaline Phosphatase: 68 U/L (ref 38–126)
Anion gap: 6 (ref 5–15)
BUN: 28 mg/dL — ABNORMAL HIGH (ref 8–23)
CO2: 26 mmol/L (ref 22–32)
Calcium: 10.7 mg/dL — ABNORMAL HIGH (ref 8.9–10.3)
Chloride: 107 mmol/L (ref 98–111)
Creatinine: 0.87 mg/dL (ref 0.61–1.24)
GFR, Estimated: >60 mL/min (ref >60)
Glucose, Bld: 108 mg/dL — ABNORMAL HIGH (ref 70–99)
Potassium: 3.7 mmol/L (ref 3.5–5.1)
Sodium: 139 mmol/L (ref 135–145)
Total Bilirubin: 0.5 mg/dL (ref 0.3–1.2)
Total Protein: 6 g/dL — ABNORMAL LOW (ref 6.5–8.1)

## 2020-07-05 LAB — CBC WITH DIFFERENTIAL (CANCER CENTER ONLY)
Abs Immature Granulocytes: 0.01 10*3/uL (ref 0.00–0.07)
Basophils Absolute: 0 10*3/uL (ref 0.0–0.1)
Basophils Relative: 1 %
Eosinophils Absolute: 0.2 10*3/uL (ref 0.0–0.5)
Eosinophils Relative: 3 %
HCT: 35 % — ABNORMAL LOW (ref 39.0–52.0)
Hemoglobin: 11.6 g/dL — ABNORMAL LOW (ref 13.0–17.0)
Immature Granulocytes: 0 %
Lymphocytes Relative: 16 %
Lymphs Abs: 1.1 10*3/uL (ref 0.7–4.0)
MCH: 31.5 pg (ref 26.0–34.0)
MCHC: 33.1 g/dL (ref 30.0–36.0)
MCV: 95.1 fL (ref 80.0–100.0)
Monocytes Absolute: 0.6 10*3/uL (ref 0.1–1.0)
Monocytes Relative: 9 %
Neutro Abs: 4.9 10*3/uL (ref 1.7–7.7)
Neutrophils Relative %: 71 %
Platelet Count: 184 10*3/uL (ref 150–400)
RBC: 3.68 MIL/uL — ABNORMAL LOW (ref 4.22–5.81)
RDW: 16.6 % — ABNORMAL HIGH (ref 11.5–15.5)
WBC Count: 6.9 10*3/uL (ref 4.0–10.5)
nRBC: 0 % (ref 0.0–0.2)

## 2020-07-05 LAB — LACTATE DEHYDROGENASE: LDH: 114 U/L (ref 98–192)

## 2020-07-05 MED ORDER — SODIUM CHLORIDE 0.9 % IV SOLN
Freq: Once | INTRAVENOUS | Status: AC
  Filled 2020-07-05: qty 250

## 2020-07-05 MED ORDER — SODIUM CHLORIDE 0.9 % IV SOLN
480.0000 mg | Freq: Once | INTRAVENOUS | Status: AC
  Administered 2020-07-05: 480 mg via INTRAVENOUS
  Filled 2020-07-05: qty 48

## 2020-07-05 NOTE — Telephone Encounter (Signed)
appts made per 07/05/20 los and due to avail spots appts made on 08/04/20, please advise if not appropriate  Francis Cunningham

## 2020-07-05 NOTE — Progress Notes (Signed)
Hematology and Oncology Follow Up Visit  Francis Cunningham [id number] 01-16-1951 70 y.o. 07/05/2020   Principle Diagnosis:   Metastatic renal cell carcinoma-bone only metastasis  Current Therapy:    Maintenance nivolumab-Q 4-week dosing     Interim History:  Francis Cunningham is back for follow-up.  We first saw him back in early May.  At that time, he had transferred his care from [facility] since he had moved over to the [place] area.  He has been doing well.  He has been seeing the doctor that at [facility].  He recently saw the orthopedic surgeon who did his right hip repair.  Everything is going quite well with this.  He still has the ulcer in the mouth.  He did see our wonderful dentist-Dr. Kara Dies they really liked.  She put him on some Peridex mouth rinse.  The ulcer is healing up slowly.  He is on low-dose prednisone because of some adrenal insufficiency.  I do think this will be a problem with respect to his nivolumab effectiveness.  He has had no change in bowel or bladder habits.  He has had some constipation.  His appetite has been quite good.  There is been no nausea or vomiting.  He has had no cough or shortness of breath.  There is been no headache.  He has had no bleeding.  Overall, his performance status is ECOG 1.  Medications:  Current Outpatient Medications:  .  acetaminophen (TYLENOL) 325 MG tablet, Take 975 mg by mouth every 8 (eight) hours., Disp: , Rfl:  .  alfuzosin (UROXATRAL) 10 MG 24 hr tablet, Take 10 mg by mouth at bedtime., Disp: , Rfl:  .  ascorbic acid (VITAMIN C) 500 MG tablet, Take 500 mg by mouth in the morning., Disp: , Rfl:  .  ASPIRIN LOW DOSE 81 MG EC tablet, Take 81 mg by mouth in the morning and at bedtime., Disp: , Rfl:  .  chlorhexidine (PERIDEX) 0.12 % solution, 15 mLs by Mouth Rinse route 4 (four) times daily., Disp: 1800 mL, Rfl: 2 .  escitalopram (LEXAPRO) 20 MG tablet, Take 1 tablet by mouth daily., Disp: , Rfl:  .  finasteride (PROSCAR) 5 MG tablet, Take  5 mg by mouth daily., Disp: , Rfl:  .  guaiFENesin (MUCINEX) 600 MG 12 hr tablet, Take 600 mg by mouth in the morning and at bedtime., Disp: , Rfl:  .  Hypertonic Nasal Wash (SINUS RINSE) PACK, Irrigate with as directed., Disp: , Rfl:  .  lidocaine (XYLOCAINE) 2 % solution, Use as directed 15 mLs in the mouth or throat every 6 (six) hours as needed for mouth pain. Swish and spit 15 mL by mouth every 6 hours as needed for mouth pain., Disp: 100 mL, Rfl: 2 .  LORazepam (ATIVAN) 1 MG tablet, Take 1 mg by mouth once., Disp: , Rfl:  .  MAGNESIUM CARBONATE PO, Take 325 mg by mouth daily., Disp: , Rfl:  .  melatonin 3 MG TABS tablet, Take 3 mg by mouth at bedtime., Disp: , Rfl:  .  midodrine (PROAMATINE) 2.5 MG tablet, Take 2.5 mg by mouth 3 (three) times daily., Disp: , Rfl:  .  montelukast (SINGULAIR) 10 MG tablet, Take 1 tablet by mouth daily., Disp: , Rfl:  .  Multiple Vitamin (MULTI-VITAMINS) TABS, Take 1 tablet by mouth daily., Disp: , Rfl:  .  nivolumab (OPDIVO) 100 MG/10ML SOLN chemo injection, Inject into the vein., Disp: , Rfl:  .  Omega-3 Fatty Acids (FISH  OIL PO), Take 5 mLs by mouth daily., Disp: , Rfl:  .  ondansetron (ZOFRAN-ODT) 4 MG disintegrating tablet, Take 4 mg by mouth every 8 (eight) hours as needed., Disp: , Rfl:  .  polyethylene glycol powder (GLYCOLAX/MIRALAX) 17 GM/SCOOP powder, Take 17 g by mouth at bedtime., Disp: , Rfl:  .  Potassium Citrate-Citric Acid 3300-1002 MG PACK, Take by mouth., Disp: , Rfl:  .  predniSONE (DELTASONE) 10 MG tablet, Take 10 mg by mouth daily., Disp: , Rfl:  .  prochlorperazine (COMPAZINE) 10 MG tablet, Take 10 mg by mouth every 6 (six) hours as needed., Disp: , Rfl:  .  propranolol (INDERAL) 10 MG tablet, Take 10 mg by mouth daily as needed., Disp: , Rfl:  .  senna-docusate (SENOKOT-S) 8.6-50 MG tablet, Take by mouth., Disp: , Rfl:  .  vitamin E 180 MG (400 UNITS) capsule, Take 400 Units by mouth every morning., Disp: , Rfl:   Allergies:   Allergies  Allergen Reactions  . Morphine Nausea Only    Past Medical History, Surgical history, Social history, and Family History were reviewed and updated.  Review of Systems: Review of Systems  Constitutional: Negative.   HENT:   Positive for mouth sores.   Eyes: Negative.   Respiratory: Negative.   Cardiovascular: Negative.   Gastrointestinal: Negative.   Endocrine: Negative.   Genitourinary: Negative.    Musculoskeletal: Positive for arthralgias and back pain.  Skin: Negative.   Neurological: Negative.   Hematological: Negative.   Psychiatric/Behavioral: Negative.     Physical Exam:  height is 6\' 3"  (1.905 m) and weight is 96.2 kg. His oral temperature is 97.6 F (36.4 C). His blood pressure is 101/67 and his pulse is 73. His respiration is 20 and oxygen saturation is 96%.   Wt Readings from Last 3 Encounters:  07/05/20 96.2 kg  05/31/20 92.1 kg    Physical Exam Vitals reviewed.  HENT:     Head: Normocephalic and atraumatic.  Eyes:     Pupils: Pupils are equal, round, and reactive to light.  Cardiovascular:     Rate and Rhythm: Normal rate and regular rhythm.     Heart sounds: Normal heart sounds.  Pulmonary:     Effort: Pulmonary effort is normal.     Breath sounds: Normal breath sounds.  Abdominal:     General: Bowel sounds are normal.     Palpations: Abdomen is soft.  Musculoskeletal:        General: No tenderness or deformity. Normal range of motion.     Cervical back: Normal range of motion.  Lymphadenopathy:     Cervical: No cervical adenopathy.  Skin:    General: Skin is warm and dry.     Findings: No erythema or rash.  Neurological:     Mental Status: He is alert and oriented to person, place, and time.  Psychiatric:        Behavior: Behavior normal.        Thought Content: Thought content normal.        Judgment: Judgment normal.      Lab Results  Component Value Date   WBC 6.9 07/05/2020   HGB 11.6 (L) 07/05/2020   HCT 35.0 (L)  07/05/2020   MCV 95.1 07/05/2020   PLT 184 07/05/2020     Chemistry      Component Value Date/Time   NA 139 07/05/2020 0818   K 3.7 07/05/2020 0818   CL 107 07/05/2020 0818   CO2 26 07/05/2020  0818   BUN 28 (H) 07/05/2020 0818   CREATININE 0.87 07/05/2020 0818      Component Value Date/Time   CALCIUM 10.7 (H) 07/05/2020 0818   ALKPHOS 68 07/05/2020 0818   AST 9 (L) 07/05/2020 0818   ALT 14 07/05/2020 0818   BILITOT 0.5 07/05/2020 0818      Impression and Plan: Francis Cunningham is a very nice 70 year old white male.  He has metastatic renal cell carcinoma.  It seems as if the disease is only in his bones.  He is on immunotherapy.  He is on maintenance nivolumab right now.  We probably will repeat his scans in July.  I will have to see what scans he had done at [facility].  I am glad that his quality of life is doing well.  He is enjoying his life at [facility]  He is eating well.  His weight has gone up.  We will plan to get him back in another 4 weeks.  If I am glad that his wife is doing such a good job with him.  She really is a source of encouragement and inspiration.   Volanda Napoleon, MD 6/7/20229:03 AM

## 2020-07-05 NOTE — Patient Instructions (Signed)
Nivolumab injection What is this medicine? NIVOLUMAB (nye VOL ue mab) is a monoclonal antibody. It treats certain types of cancer. Some of the cancers treated are colon cancer, head and neck cancer, Hodgkin lymphoma, lung cancer, and melanoma. This medicine may be used for other purposes; ask your health care provider or pharmacist if you have questions. COMMON BRAND NAME(S): Opdivo What should I tell my health care provider before I take this medicine? They need to know if you have any of these conditions:  autoimmune diseases like Crohn's disease, ulcerative colitis, or lupus  have had or planning to have an allogeneic stem cell transplant (uses someone else's stem cells)  history of chest radiation  history of organ transplant  nervous system problems like myasthenia gravis or Guillain-Barre syndrome  an unusual or allergic reaction to nivolumab, other medicines, foods, dyes, or preservatives  pregnant or trying to get pregnant  breast-feeding How should I use this medicine? This medicine is for infusion into a vein. It is given by a health care professional in a hospital or clinic setting. A special MedGuide will be given to you before each treatment. Be sure to read this information carefully each time. Talk to your pediatrician regarding the use of this medicine in children. While this drug may be prescribed for children as young as 12 years for selected conditions, precautions do apply. Overdosage: If you think you have taken too much of this medicine contact a poison control center or emergency room at once. NOTE: This medicine is only for you. Do not share this medicine with others. What if I miss a dose? It is important not to miss your dose. Call your doctor or health care professional if you are unable to keep an appointment. What may interact with this medicine? Interactions have not been studied. This list may not describe all possible interactions. Give your health  care provider a list of all the medicines, herbs, non-prescription drugs, or dietary supplements you use. Also tell them if you smoke, drink alcohol, or use illegal drugs. Some items may interact with your medicine. What should I watch for while using this medicine? This drug may make you feel generally unwell. Continue your course of treatment even though you feel ill unless your doctor tells you to stop. You may need blood work done while you are taking this medicine. Do not become pregnant while taking this medicine or for 5 months after stopping it. Women should inform their doctor if they wish to become pregnant or think they might be pregnant. There is a potential for serious side effects to an unborn child. Talk to your health care professional or pharmacist for more information. Do not breast-feed an infant while taking this medicine or for 5 months after stopping it. What side effects may I notice from receiving this medicine? Side effects that you should report to your doctor or health care professional as soon as possible:  allergic reactions like skin rash, itching or hives, swelling of the face, lips, or tongue  breathing problems  blood in the urine  bloody or watery diarrhea or black, tarry stools  changes in emotions or moods  changes in vision  chest pain  cough  dizziness  feeling faint or lightheaded, falls  fever, chills  headache with fever, neck stiffness, confusion, loss of memory, sensitivity to light, hallucination, loss of contact with reality, or seizures  joint pain  mouth sores  redness, blistering, peeling or loosening of the skin, including inside the   mouth  severe muscle pain or weakness  signs and symptoms of high blood sugar such as dizziness; dry mouth; dry skin; fruity breath; nausea; stomach pain; increased hunger or thirst; increased urination  signs and symptoms of kidney injury like trouble passing urine or change in the amount of  urine  signs and symptoms of liver injury like dark yellow or brown urine; general ill feeling or flu-like symptoms; light-colored stools; loss of appetite; nausea; right upper belly pain; unusually weak or tired; yellowing of the eyes or skin  swelling of the ankles, feet, hands  trouble passing urine or change in the amount of urine  unusually weak or tired  weight gain or loss Side effects that usually do not require medical attention (report to your doctor or health care professional if they continue or are bothersome):  bone pain  constipation  decreased appetite  diarrhea  muscle pain  nausea, vomiting  tiredness This list may not describe all possible side effects. Call your doctor for medical advice about side effects. You may report side effects to FDA at 1-800-FDA-1088. Where should I keep my medicine? This drug is given in a hospital or clinic and will not be stored at home. NOTE: This sheet is a summary. It may not cover all possible information. If you have questions about this medicine, talk to your doctor, pharmacist, or health care provider.  2021 Elsevier/Gold Standard (2019-05-20 10:08:25)  

## 2020-07-13 ENCOUNTER — Other Ambulatory Visit: Payer: Self-pay

## 2020-07-13 ENCOUNTER — Encounter (HOSPITAL_COMMUNITY): Payer: Self-pay | Admitting: Dentistry

## 2020-07-13 ENCOUNTER — Ambulatory Visit (INDEPENDENT_AMBULATORY_CARE_PROVIDER_SITE_OTHER): Payer: Self-pay | Admitting: Dentistry

## 2020-07-13 DIAGNOSIS — M87180 Osteonecrosis due to drugs, jaw: Secondary | ICD-10-CM

## 2020-07-13 MED ORDER — CHLORHEXIDINE GLUCONATE 0.12 % MT SOLN: 15.0000 mL | Freq: Four times a day (QID) | OROMUCOSAL | 2 refills | Status: DC

## 2020-07-13 NOTE — Progress Notes (Signed)
Department of Dental Medicine     FOLLOW-UP VISIT  Service Date:   07/13/2020  Patient Name:   Francis Cunningham Date of Birth:   09/20/1950 Medical Record Number: [id number]        TODAY'S VISIT:   Assessment:   The area of exposed bone remains stable with slight improvement of soft tissue; erythematous/edematous margins have improved.  Plan:  Follow-up again in 6 weeks. Rx:  Refill of chlorhexidine gluconate 0.12% Recommendations: Continue chlorhexidine rinses as prescribed and viscous lidocaine as needed for pain. Try placing rope wax onto area of rough bone to help with tongue ulcerations/pain.       PROGRESS NOTE:   COVID-19 SCREENING:  The patient denies symptoms concerning for COVID-19 infection including fever, chills, cough, or newly developed shortness of breath.   HISTORY OF PRESENT ILLNESS Francis Cunningham presents today for a follow-up visit to continue monitoring area of exposed bone in the lower right quadrant.   Medical and dental history reviewed with the patient.   CHIEF COMPLAINT:   Patient reports that his symptoms have been about the same.  He reports that his tongue has started to hurt sometimes when it rubs against the area of exposed bone.  He states the viscous lidocaine does help relieve his intense throbbing pain for about one hour before it starts to wear off.   Patient Active Problem List   Diagnosis Date Noted   Goals of care, counseling/discussion 05/31/2020   Abnormal findings on diagnostic imaging of other specified body structures 05/11/2020   Arterial hypotension 05/11/2020   Acquired hypothyroidism 04/13/2020   Jaw pain 04/13/2020   Metastatic renal cell carcinoma ([place]) 11/26/2019   BPH (benign prostatic hyperplasia) 11/18/2019   HLD (hyperlipidemia) 11/18/2019   Hypercalcemia of malignancy 11/13/2019   Depression with anxiety 03/12/2017   Hematuria 11/22/2016   Male erectile dysfunction, unspecified 03/23/2014   Past Medical History:   Diagnosis Date   Goals of care, counseling/discussion 05/31/2020   No past surgical history on file. Current Outpatient Medications  Medication Sig Dispense Refill   acetaminophen (TYLENOL) 325 MG tablet Take 975 mg by mouth every 8 (eight) hours.     alfuzosin (UROXATRAL) 10 MG 24 hr tablet Take 10 mg by mouth at bedtime.     ascorbic acid (VITAMIN C) 500 MG tablet Take 500 mg by mouth in the morning.     ASPIRIN LOW DOSE 81 MG EC tablet Take 81 mg by mouth in the morning and at bedtime.     chlorhexidine (PERIDEX) 0.12 % solution 15 mLs by Mouth Rinse route 4 (four) times daily. 1800 mL 2   escitalopram (LEXAPRO) 20 MG tablet Take 1 tablet by mouth daily.     finasteride (PROSCAR) 5 MG tablet Take 5 mg by mouth daily.     guaiFENesin (MUCINEX) 600 MG 12 hr tablet Take 600 mg by mouth in the morning and at bedtime.     Hypertonic Nasal Wash (SINUS RINSE) PACK Irrigate with as directed.     lidocaine (XYLOCAINE) 2 % solution Use as directed 15 mLs in the mouth or throat every 6 (six) hours as needed for mouth pain. Swish and spit 15 mL by mouth every 6 hours as needed for mouth pain. 100 mL 2   LORazepam (ATIVAN) 1 MG tablet Take 1 mg by mouth once.     MAGNESIUM CARBONATE PO Take 325 mg by mouth daily.     melatonin 3 MG TABS tablet Take 3 mg by mouth  at bedtime.     midodrine (PROAMATINE) 2.5 MG tablet Take 2.5 mg by mouth 3 (three) times daily.     montelukast (SINGULAIR) 10 MG tablet Take 1 tablet by mouth daily.     Multiple Vitamin (MULTI-VITAMINS) TABS Take 1 tablet by mouth daily.     nivolumab (OPDIVO) 100 MG/10ML SOLN chemo injection Inject into the vein.     Omega-3 Fatty Acids (FISH OIL PO) Take 5 mLs by mouth daily.     ondansetron (ZOFRAN-ODT) 4 MG disintegrating tablet Take 4 mg by mouth every 8 (eight) hours as needed.     polyethylene glycol powder (GLYCOLAX/MIRALAX) 17 GM/SCOOP powder Take 17 g by mouth at bedtime.     Potassium Citrate-Citric Acid 3300-1002 MG PACK Take  by mouth.     predniSONE (DELTASONE) 10 MG tablet Take 10 mg by mouth daily.     prochlorperazine (COMPAZINE) 10 MG tablet Take 10 mg by mouth every 6 (six) hours as needed.     propranolol (INDERAL) 10 MG tablet Take 10 mg by mouth daily as needed.     senna-docusate (SENOKOT-S) 8.6-50 MG tablet Take by mouth.     vitamin E 180 MG (400 UNITS) capsule Take 400 Units by mouth every morning.     No current facility-administered medications for this visit.   Allergies  Allergen Reactions   Morphine Nausea Only    LABS: Lab Results  Component Value Date   WBC 6.9 07/05/2020   HGB 11.6 (L) 07/05/2020   HCT 35.0 (L) 07/05/2020   MCV 95.1 07/05/2020   PLT 184 07/05/2020   BMET    Component Value Date/Time   NA 139 07/05/2020 0818   K 3.7 07/05/2020 0818   CL 107 07/05/2020 0818   CO2 26 07/05/2020 0818   GLUCOSE 108 (H) 07/05/2020 0818   BUN 28 (H) 07/05/2020 0818   CREATININE 0.87 07/05/2020 0818   CALCIUM 10.7 (H) 07/05/2020 0818   GFRNONAA >60 07/05/2020 0818    No results found for: INR, PROTIME No results found for: PTT   VITALS: BP 102/63 (BP Location: Right Arm, Patient Position: Sitting, Cuff Size: Normal)   Pulse 71   Temp 98.1 F (36.7 C) (Oral)    EXAM: The area of exposed bone in the lower right lingual side remains the same size (~8x3 mm).  Soft tissue appears better than previous visit with no erythema or edema surrounding the margins. No signs of wound dehiscence or infection evident upon examination. Exposed bone still appears vital and unable to remove any bone with curette.   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   PLAN: Return in 6 weeks for another follow-up visit.    Rx:  Continue chlorhexidine rinses and viscous lidocaine rinses as needed for pain. Try using rope wax to protect your tongue from roughness of bone.  Call if any questions or concerns arise.   All questions and concerns were invited and addressed.  The  patient tolerated today's visit well and departed in stable condition.  [place], D.M.D.

## 2020-08-04 ENCOUNTER — Other Ambulatory Visit: Payer: Self-pay

## 2020-08-04 ENCOUNTER — Inpatient Hospital Stay: Payer: Medicare Other

## 2020-08-04 ENCOUNTER — Inpatient Hospital Stay: Payer: Medicare Other | Attending: Hematology & Oncology

## 2020-08-04 ENCOUNTER — Telehealth: Payer: Self-pay | Admitting: *Deleted

## 2020-08-04 ENCOUNTER — Encounter: Payer: Self-pay | Admitting: Hematology & Oncology

## 2020-08-04 ENCOUNTER — Inpatient Hospital Stay (HOSPITAL_BASED_OUTPATIENT_CLINIC_OR_DEPARTMENT_OTHER): Payer: Medicare Other | Admitting: Hematology & Oncology

## 2020-08-04 VITALS — BP 103/63 | HR 72 | Temp 97.0°F | Resp 20 | Wt 220.0 lb

## 2020-08-04 VITALS — BP 85/61 | HR 59

## 2020-08-04 DIAGNOSIS — C641 Malignant neoplasm of right kidney, except renal pelvis: Secondary | ICD-10-CM | POA: Diagnosis not present

## 2020-08-04 DIAGNOSIS — Z923 Personal history of irradiation: Secondary | ICD-10-CM | POA: Insufficient documentation

## 2020-08-04 DIAGNOSIS — Z5112 Encounter for antineoplastic immunotherapy: Secondary | ICD-10-CM | POA: Insufficient documentation

## 2020-08-04 DIAGNOSIS — M2550 Pain in unspecified joint: Secondary | ICD-10-CM | POA: Diagnosis not present

## 2020-08-04 DIAGNOSIS — M549 Dorsalgia, unspecified: Secondary | ICD-10-CM | POA: Diagnosis not present

## 2020-08-04 DIAGNOSIS — C649 Malignant neoplasm of unspecified kidney, except renal pelvis: Secondary | ICD-10-CM | POA: Diagnosis present

## 2020-08-04 DIAGNOSIS — M879 Osteonecrosis, unspecified: Secondary | ICD-10-CM | POA: Diagnosis not present

## 2020-08-04 DIAGNOSIS — Z7952 Long term (current) use of systemic steroids: Secondary | ICD-10-CM | POA: Insufficient documentation

## 2020-08-04 DIAGNOSIS — Z87891 Personal history of nicotine dependence: Secondary | ICD-10-CM | POA: Insufficient documentation

## 2020-08-04 DIAGNOSIS — C7951 Secondary malignant neoplasm of bone: Secondary | ICD-10-CM | POA: Diagnosis present

## 2020-08-04 LAB — CBC WITH DIFFERENTIAL (CANCER CENTER ONLY)
Abs Immature Granulocytes: 0.04 10*3/uL (ref 0.00–0.07)
Basophils Absolute: 0 10*3/uL (ref 0.0–0.1)
Basophils Relative: 1 %
Eosinophils Absolute: 0.1 10*3/uL (ref 0.0–0.5)
Eosinophils Relative: 2 %
HCT: 35.7 % — ABNORMAL LOW (ref 39.0–52.0)
Hemoglobin: 11.7 g/dL — ABNORMAL LOW (ref 13.0–17.0)
Immature Granulocytes: 1 %
Lymphocytes Relative: 17 %
Lymphs Abs: 1.2 10*3/uL (ref 0.7–4.0)
MCH: 31.6 pg (ref 26.0–34.0)
MCHC: 32.8 g/dL (ref 30.0–36.0)
MCV: 96.5 fL (ref 80.0–100.0)
Monocytes Absolute: 0.6 10*3/uL (ref 0.1–1.0)
Monocytes Relative: 9 %
Neutro Abs: 4.9 10*3/uL (ref 1.7–7.7)
Neutrophils Relative %: 70 %
Platelet Count: 185 10*3/uL (ref 150–400)
RBC: 3.7 MIL/uL — ABNORMAL LOW (ref 4.22–5.81)
RDW: 13.8 % (ref 11.5–15.5)
WBC Count: 6.9 10*3/uL (ref 4.0–10.5)
nRBC: 0 % (ref 0.0–0.2)

## 2020-08-04 LAB — LACTATE DEHYDROGENASE: LDH: 130 U/L (ref 98–192)

## 2020-08-04 LAB — CMP (CANCER CENTER ONLY)
ALT: 14 U/L (ref 0–44)
AST: 10 U/L — ABNORMAL LOW (ref 15–41)
Albumin: 4 g/dL (ref 3.5–5.0)
Alkaline Phosphatase: 65 U/L (ref 38–126)
Anion gap: 7 (ref 5–15)
BUN: 28 mg/dL — ABNORMAL HIGH (ref 8–23)
CO2: 28 mmol/L (ref 22–32)
Calcium: 10.7 mg/dL — ABNORMAL HIGH (ref 8.9–10.3)
Chloride: 105 mmol/L (ref 98–111)
Creatinine: 1 mg/dL (ref 0.61–1.24)
GFR, Estimated: >60 mL/min (ref >60)
Glucose, Bld: 116 mg/dL — ABNORMAL HIGH (ref 70–99)
Potassium: 3.8 mmol/L (ref 3.5–5.1)
Sodium: 140 mmol/L (ref 135–145)
Total Bilirubin: 0.5 mg/dL (ref 0.3–1.2)
Total Protein: 6.1 g/dL — ABNORMAL LOW (ref 6.5–8.1)

## 2020-08-04 MED ORDER — SODIUM CHLORIDE 0.9% FLUSH
10.0000 mL | INTRAVENOUS | Status: DC | PRN
  Administered 2020-08-04: 10 mL
  Filled 2020-08-04: qty 10

## 2020-08-04 MED ORDER — MIDODRINE HCL 2.5 MG PO TABS: 2.5000 mg | ORAL_TABLET | Freq: Three times a day (TID) | ORAL | 8 refills | Status: DC

## 2020-08-04 MED ORDER — SODIUM CHLORIDE 0.9 % IV SOLN
Freq: Once | INTRAVENOUS | Status: AC
  Filled 2020-08-04: qty 250

## 2020-08-04 MED ORDER — HEPARIN SOD (PORK) LOCK FLUSH 100 UNIT/ML IV SOLN
500.0000 [IU] | Freq: Once | INTRAVENOUS | Status: AC | PRN
  Administered 2020-08-04: 500 [IU]
  Filled 2020-08-04: qty 5

## 2020-08-04 MED ORDER — SODIUM CHLORIDE 0.9 % IV SOLN
480.0000 mg | Freq: Once | INTRAVENOUS | Status: AC
  Administered 2020-08-04: 480 mg via INTRAVENOUS
  Filled 2020-08-04: qty 48

## 2020-08-04 NOTE — Patient Instructions (Signed)
[facility]  Discharge Instructions: Thank you for choosing [facility] to provide your oncology and hematology care.   If you have a lab appointment with the [facility], please go directly to the [facility] and check in at the registration area.  Wear comfortable clothing and clothing appropriate for easy access to any Portacath or PICC line.   We strive to give you quality time with your provider. You may need to reschedule your appointment if you arrive late (15 or more minutes).  Arriving late affects you and other patients whose appointments are after yours.  Also, if you miss three or more appointments without notifying the office, you may be dismissed from the clinic at the provider's discretion.      For prescription refill requests, have your pharmacy contact our office and allow 72 hours for refills to be completed.    Today you received the following chemotherapy and/or immunotherapy agents opdivo      To help prevent nausea and vomiting after your treatment, we encourage you to take your nausea medication as directed.  BELOW ARE SYMPTOMS THAT SHOULD BE REPORTED IMMEDIATELY: *FEVER GREATER THAN 100.4 F (38 C) OR HIGHER *CHILLS OR SWEATING *NAUSEA AND VOMITING THAT IS NOT CONTROLLED WITH YOUR NAUSEA MEDICATION *UNUSUAL SHORTNESS OF BREATH *UNUSUAL BRUISING OR BLEEDING *URINARY PROBLEMS (pain or burning when urinating, or frequent urination) *BOWEL PROBLEMS (unusual diarrhea, constipation, pain near the anus) TENDERNESS IN MOUTH AND THROAT WITH OR WITHOUT PRESENCE OF ULCERS (sore throat, sores in mouth, or a toothache) UNUSUAL RASH, SWELLING OR PAIN  UNUSUAL VAGINAL DISCHARGE OR ITCHING   Items with * indicate a potential emergency and should be followed up as soon as possible or go to the Emergency Department if any problems should occur.  Please show the CHEMOTHERAPY ALERT CARD or IMMUNOTHERAPY ALERT CARD at check-in to the  Emergency Department and triage nurse. Should you have questions after your visit or need to cancel or reschedule your appointment, please contact [place]  [phone] and follow the prompts.  Office hours are 8:00 a.m. to 4:30 p.m. Monday - Friday. Please note that voicemails left after 4:00 p.m. may not be returned until the following business day.  We are closed weekends and major holidays. You have access to a nurse at all times for urgent questions. Please call the main number to the clinic [phone] and follow the prompts.  For any non-urgent questions, you may also contact your provider using MyChart. We now offer e-Visits for anyone 20 and older to request care online for non-urgent symptoms. For details visit [email or web address].   Also download the MyChart app! Go to the app store, search "MyChart", open the app, select [facility], and log in with your MyChart username and password.  Due to Covid, a mask is required upon entering the hospital/clinic. If you do not have a mask, one will be given to you upon arrival. For doctor visits, patients may have 1 support person aged 41 or older with them. For treatment visits, patients cannot have anyone with them due to current Covid guidelines and our immunocompromised population. Nivolumab injection What is this medication? NIVOLUMAB (nye VOL ue mab) is a monoclonal antibody. It treats certain types of cancer. Some of the cancers treated are colon cancer, head and neck cancer,Hodgkin lymphoma, lung cancer, and melanoma. This medicine may be used for other purposes; ask your health care provider orpharmacist if you have  questions. COMMON BRAND NAME(S): Opdivo What should I tell my care team before I take this medication? They need to know if you have any of these conditions: autoimmune diseases like Crohn's disease, ulcerative colitis, or lupus have had or planning to have an allogeneic stem cell transplant  (uses someone else's stem cells) history of chest radiation history of organ transplant nervous system problems like myasthenia gravis or Guillain-Barre syndrome an unusual or allergic reaction to nivolumab, other medicines, foods, dyes, or preservatives pregnant or trying to get pregnant breast-feeding How should I use this medication? This medicine is for infusion into a vein. It is given by a health careprofessional in a hospital or clinic setting. A special MedGuide will be given to you before each treatment. Be sure to readthis information carefully each time. Talk to your pediatrician regarding the use of this medicine in children. While this drug may be prescribed for children as young as 12 years for selectedconditions, precautions do apply. Overdosage: If you think you have taken too much of this medicine contact apoison control center or emergency room at once. NOTE: This medicine is only for you. Do not share this medicine with others. What if I miss a dose? It is important not to miss your dose. Call your doctor or health careprofessional if you are unable to keep an appointment. What may interact with this medication? Interactions have not been studied. This list may not describe all possible interactions. Give your health care provider a list of all the medicines, herbs, non-prescription drugs, or dietary supplements you use. Also tell them if you smoke, drink alcohol, or use illegaldrugs. Some items may interact with your medicine. What should I watch for while using this medication? This drug may make you feel generally unwell. Continue your course of treatmenteven though you feel ill unless your doctor tells you to stop. You may need blood work done while you are taking this medicine. Do not become pregnant while taking this medicine or for 5 months after stopping it. Women should inform their doctor if they wish to become pregnant or think they might be pregnant. There is a  potential for serious side effects to an unborn child. Talk to your health care professional or pharmacist for more information. Do not breast-feed an infant while taking this medicine orfor 5 months after stopping it. What side effects may I notice from receiving this medication? Side effects that you should report to your doctor or health care professionalas soon as possible: allergic reactions like skin rash, itching or hives, swelling of the face, lips, or tongue breathing problems blood in the urine bloody or watery diarrhea or black, tarry stools changes in emotions or moods changes in vision chest pain cough dizziness feeling faint or lightheaded, falls fever, chills headache with fever, neck stiffness, confusion, loss of memory, sensitivity to light, hallucination, loss of contact with reality, or seizures joint pain mouth sores redness, blistering, peeling or loosening of the skin, including inside the mouth severe muscle pain or weakness signs and symptoms of high blood sugar such as dizziness; dry mouth; dry skin; fruity breath; nausea; stomach pain; increased hunger or thirst; increased urination signs and symptoms of kidney injury like trouble passing urine or change in the amount of urine signs and symptoms of liver injury like dark yellow or brown urine; general ill feeling or flu-like symptoms; light-colored stools; loss of appetite; nausea; right upper belly pain; unusually weak or tired; yellowing of the eyes or skin swelling of the  ankles, feet, hands trouble passing urine or change in the amount of urine unusually weak or tired weight gain or loss Side effects that usually do not require medical attention (report to yourdoctor or health care professional if they continue or are bothersome): bone pain constipation decreased appetite diarrhea muscle pain nausea, vomiting tiredness This list may not describe all possible side effects. Call your doctor for medical  advice about side effects. You may report side effects to FDA at1-800-FDA-1088. Where should I keep my medication? This drug is given in a hospital or clinic and will not be stored at home. NOTE: This sheet is a summary. It may not cover all possible information. If you have questions about this medicine, talk to your doctor, pharmacist, orhealth care provider.  2022 Elsevier/Gold Standard (2019-05-20 10:08:25)

## 2020-08-04 NOTE — Telephone Encounter (Signed)
Per 08/04/20 los gave patient upcoming appointment - patient confirmed - view mychart

## 2020-08-04 NOTE — Progress Notes (Signed)
Hematology and Oncology Follow Up Visit  Francis Cunningham [id number] Dec 05, 1950 70 y.o. 08/04/2020   Principle Diagnosis:  Metastatic renal cell carcinoma-bone only metastasis  Current Therapy:   Maintenance nivolumab-Q 4-week dosing     Interim History:  Francis Cunningham is back for follow-up.  He comes in with his wife.  They are doing quite nicely.  His biggest problem is the lesion in his mouth.  He may have a little bit of osteonecrosis on the right side.  He is followed by Dr. Benson Norway our wonderful dentist at [facility].  She has been on Peridex mouth rinse.  There is been no issues with fever.  He has had no cough or shortness of breath.  His bony pain seems to be doing pretty well right now.  He does have some pain over the right hip area where he had surgery.  He has had no nausea or vomiting.  There has been no change in bowel or bladder habits.  Has had no rashes.  There may be a little bit of chronic swelling in his legs.  He is on low-dose prednisone.  Currently, I would have to say his performance status is ECOG 1.     Medications:  Current Outpatient Medications:    acetaminophen (TYLENOL) 325 MG tablet, Take 975 mg by mouth every 8 (eight) hours., Disp: , Rfl:    alfuzosin (UROXATRAL) 10 MG 24 hr tablet, Take 10 mg by mouth at bedtime., Disp: , Rfl:    ascorbic acid (VITAMIN C) 500 MG tablet, Take 500 mg by mouth in the morning., Disp: , Rfl:    ASPIRIN LOW DOSE 81 MG EC tablet, Take 81 mg by mouth in the morning and at bedtime., Disp: , Rfl:    chlorhexidine (PERIDEX) 0.12 % solution, 15 mLs by Mouth Rinse route 4 (four) times daily., Disp: 1800 mL, Rfl: 2   escitalopram (LEXAPRO) 20 MG tablet, Take 1 tablet by mouth daily., Disp: , Rfl:    finasteride (PROSCAR) 5 MG tablet, Take 5 mg by mouth daily., Disp: , Rfl:    guaiFENesin (MUCINEX) 600 MG 12 hr tablet, Take 600 mg by mouth in the morning and at bedtime., Disp: , Rfl:    Hypertonic Nasal Wash (SINUS RINSE) PACK, Irrigate  with as directed., Disp: , Rfl:    lidocaine (XYLOCAINE) 2 % solution, Use as directed 15 mLs in the mouth or throat every 6 (six) hours as needed for mouth pain. Swish and spit 15 mL by mouth every 6 hours as needed for mouth pain., Disp: 100 mL, Rfl: 2   LORazepam (ATIVAN) 1 MG tablet, Take 1 mg by mouth once., Disp: , Rfl:    MAGNESIUM CARBONATE PO, Take 325 mg by mouth daily., Disp: , Rfl:    melatonin 3 MG TABS tablet, Take 3 mg by mouth at bedtime., Disp: , Rfl:    midodrine (PROAMATINE) 2.5 MG tablet, Take 2.5 mg by mouth 3 (three) times daily., Disp: , Rfl:    montelukast (SINGULAIR) 10 MG tablet, Take 1 tablet by mouth daily., Disp: , Rfl:    Multiple Vitamin (MULTI-VITAMINS) TABS, Take 1 tablet by mouth daily., Disp: , Rfl:    nivolumab (OPDIVO) 100 MG/10ML SOLN chemo injection, Inject into the vein., Disp: , Rfl:    Omega-3 Fatty Acids (FISH OIL PO), Take 5 mLs by mouth daily., Disp: , Rfl:    ondansetron (ZOFRAN-ODT) 4 MG disintegrating tablet, Take 4 mg by mouth every 8 (eight) hours as needed.,  Disp: , Rfl:    polyethylene glycol powder (GLYCOLAX/MIRALAX) 17 GM/SCOOP powder, Take 17 g by mouth at bedtime., Disp: , Rfl:    Potassium Citrate-Citric Acid 3300-1002 MG PACK, Take by mouth., Disp: , Rfl:    predniSONE (DELTASONE) 10 MG tablet, Take 10 mg by mouth daily., Disp: , Rfl:    prochlorperazine (COMPAZINE) 10 MG tablet, Take 10 mg by mouth every 6 (six) hours as needed., Disp: , Rfl:    propranolol (INDERAL) 10 MG tablet, Take 10 mg by mouth daily as needed., Disp: , Rfl:    senna-docusate (SENOKOT-S) 8.6-50 MG tablet, Take by mouth., Disp: , Rfl:    vitamin E 180 MG (400 UNITS) capsule, Take 400 Units by mouth every morning., Disp: , Rfl:   Allergies:  Allergies  Allergen Reactions   Morphine Nausea Only    Past Medical History, Surgical history, Social history, and Family History were reviewed and updated.  Review of Systems: Review of Systems  Constitutional:  Negative.   HENT:   Positive for mouth sores.   Eyes: Negative.   Respiratory: Negative.    Cardiovascular: Negative.   Gastrointestinal: Negative.   Endocrine: Negative.   Genitourinary: Negative.    Musculoskeletal:  Positive for arthralgias and back pain.  Skin: Negative.   Neurological: Negative.   Hematological: Negative.   Psychiatric/Behavioral: Negative.     Physical Exam:  vitals were not taken for this visit.   Wt Readings from Last 3 Encounters:  07/05/20 212 lb (96.2 kg)  05/31/20 203 lb (92.1 kg)    Physical Exam Vitals reviewed.  HENT:     Head: Normocephalic and atraumatic.  Eyes:     Pupils: Pupils are equal, round, and reactive to light.  Cardiovascular:     Rate and Rhythm: Normal rate and regular rhythm.     Heart sounds: Normal heart sounds.  Pulmonary:     Effort: Pulmonary effort is normal.     Breath sounds: Normal breath sounds.  Abdominal:     General: Bowel sounds are normal.     Palpations: Abdomen is soft.  Musculoskeletal:        General: No tenderness or deformity. Normal range of motion.     Cervical back: Normal range of motion.  Lymphadenopathy:     Cervical: No cervical adenopathy.  Skin:    General: Skin is warm and dry.     Findings: No erythema or rash.  Neurological:     Mental Status: He is alert and oriented to person, place, and time.  Psychiatric:        Behavior: Behavior normal.        Thought Content: Thought content normal.        Judgment: Judgment normal.     Lab Results  Component Value Date   WBC 6.9 07/05/2020   HGB 11.6 (L) 07/05/2020   HCT 35.0 (L) 07/05/2020   MCV 95.1 07/05/2020   PLT 184 07/05/2020     Chemistry      Component Value Date/Time   NA 139 07/05/2020 0818   K 3.7 07/05/2020 0818   CL 107 07/05/2020 0818   CO2 26 07/05/2020 0818   BUN 28 (H) 07/05/2020 0818   CREATININE 0.87 07/05/2020 0818      Component Value Date/Time   CALCIUM 10.7 (H) 07/05/2020 0818   ALKPHOS 68  07/05/2020 0818   AST 9 (L) 07/05/2020 0818   ALT 14 07/05/2020 0818   BILITOT 0.5 07/05/2020 0818  Impression and Plan: Francis Cunningham is a very nice 70 year old white male.  He has metastatic renal cell carcinoma.  It seems as if the disease is only in his bones.  He is on immunotherapy.  He is on maintenance nivolumab right now.  I will have to repeat his scans now.  I will have to find out what kind to scans he had done at [facility] so we can get these set up here.  I forgot to mention that he and his wife had a wonderful July 4 at [facility].  They really enjoyed himself.  They really enjoy being there.  They have made good friends already.  We will plan to get him back to see [place] in another 4 weeks.   Volanda Napoleon, MD 7/7/20228:07 AM

## 2020-08-24 ENCOUNTER — Other Ambulatory Visit (HOSPITAL_COMMUNITY): Payer: Medicare Other | Admitting: Dentistry

## 2020-08-25 ENCOUNTER — Ambulatory Visit (INDEPENDENT_AMBULATORY_CARE_PROVIDER_SITE_OTHER): Payer: PRIVATE HEALTH INSURANCE | Admitting: Dentistry

## 2020-08-25 ENCOUNTER — Encounter (HOSPITAL_COMMUNITY): Payer: Self-pay | Admitting: Dentistry

## 2020-08-25 ENCOUNTER — Other Ambulatory Visit: Payer: Self-pay

## 2020-08-25 DIAGNOSIS — M87180 Osteonecrosis due to drugs, jaw: Secondary | ICD-10-CM | POA: Diagnosis not present

## 2020-08-25 NOTE — Progress Notes (Signed)
Department of Dental Medicine     FOLLOW-UP VISIT  Service Date:   08/25/2020  Patient Name:   Francis Cunningham Date of Birth:   07/16/1950 Medical Record Number: [id number]        TODAY'S VISIT:   Assessment:   The patient continues to improve.  The area of concern with necrotic bone removed today under local anesthesia.  Plan:  Follow-up again in 2-3 weeks. Rx:  Peridex twice daily for 2-3 more days. Discontinue other prescriptions.   Recommendations: Warm salt water water rinses as needed for pain and to aid healing.  Discussed in detail all treatment options and recommendations with the patient and they are agreeable to the plan.       PROGRESS NOTE:   COVID-19 SCREENING:  The patient denies symptoms concerning for COVID-19 infection including fever, chills, cough, or newly developed shortness of breath.   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   Medical and dental history reviewed with the patient.   CHIEF COMPLAINT:   Patient reports that about 2-3 weeks ago he began experiencing worsening pain and noticed sharpness on the lower right side with his tongue.  He describes the pain as throbbing and has been unable to manage it as he was previously with topical medication and Tylenol.   Patient Active Problem List   Diagnosis Date Noted   Goals of care, counseling/discussion 05/31/2020   Abnormal findings on diagnostic imaging of other specified body structures 05/11/2020   Arterial hypotension 05/11/2020   Acquired hypothyroidism 04/13/2020   Jaw pain 04/13/2020   Metastatic renal cell carcinoma ([place]) 11/26/2019   BPH (benign prostatic hyperplasia) 11/18/2019   HLD (hyperlipidemia) 11/18/2019   Hypercalcemia of malignancy 11/13/2019   Depression with anxiety 03/12/2017   Hematuria 11/22/2016   Male erectile dysfunction, unspecified 03/23/2014   Past Medical  History:  Diagnosis Date   Goals of care, counseling/discussion 05/31/2020   No past surgical history on file. Current Outpatient Medications  Medication Sig Dispense Refill   acetaminophen (TYLENOL) 325 MG tablet Take 975 mg by mouth every 8 (eight) hours.     alfuzosin (UROXATRAL) 10 MG 24 hr tablet Take 10 mg by mouth at bedtime.     ascorbic acid (VITAMIN C) 500 MG tablet Take 500 mg by mouth in the morning.     ASPIRIN LOW DOSE 81 MG EC tablet Take 81 mg by mouth in the morning and at bedtime.     chlorhexidine (PERIDEX) 0.12 % solution 15 mLs by Mouth Rinse route 4 (four) times daily. 1800 mL 2   escitalopram (LEXAPRO) 20 MG tablet Take 1 tablet by mouth daily.     finasteride (PROSCAR) 5 MG tablet Take 5 mg by mouth daily.     guaiFENesin (MUCINEX) 600 MG 12 hr tablet Take 600 mg by mouth in the morning and at bedtime.     Hypertonic Nasal Wash (SINUS RINSE) PACK Irrigate with as directed.     lidocaine (XYLOCAINE) 2 % solution Use as directed 15 mLs in the mouth or throat every 6 (six) hours as needed for mouth pain. Swish and spit 15 mL by mouth every 6 hours as needed for mouth pain. 100 mL 2   LORazepam (ATIVAN) 1 MG tablet Take 1 mg by mouth once.     MAGNESIUM CARBONATE PO Take 325 mg by mouth daily.     melatonin 3 MG TABS tablet Take  3 mg by mouth at bedtime.     midodrine (PROAMATINE) 2.5 MG tablet Take 1 tablet (2.5 mg total) by mouth 3 (three) times daily. 90 tablet 8   montelukast (SINGULAIR) 10 MG tablet Take 1 tablet by mouth daily.     Multiple Vitamin (MULTI-VITAMINS) TABS Take 1 tablet by mouth daily.     nivolumab (OPDIVO) 100 MG/10ML SOLN chemo injection Inject into the vein.     Omega-3 Fatty Acids (FISH OIL PO) Take 5 mLs by mouth daily.     ondansetron (ZOFRAN-ODT) 4 MG disintegrating tablet Take 4 mg by mouth every 8 (eight) hours as needed.     polyethylene glycol powder (GLYCOLAX/MIRALAX) 17 GM/SCOOP powder Take 17 g by mouth at bedtime.     Potassium  Citrate-Citric Acid 3300-1002 MG PACK Take by mouth.     predniSONE (DELTASONE) 10 MG tablet Take 10 mg by mouth daily.     prochlorperazine (COMPAZINE) 10 MG tablet Take 10 mg by mouth every 6 (six) hours as needed.     propranolol (INDERAL) 10 MG tablet Take 10 mg by mouth daily as needed.     senna-docusate (SENOKOT-S) 8.6-50 MG tablet Take by mouth.     vitamin E 180 MG (400 UNITS) capsule Take 400 Units by mouth every morning.     No current facility-administered medications for this visit.   Allergies  Allergen Reactions   Morphine Nausea Only    LABS: Lab Results  Component Value Date   WBC 6.9 08/04/2020   HGB 11.7 (L) 08/04/2020   HCT 35.7 (L) 08/04/2020   MCV 96.5 08/04/2020   PLT 185 08/04/2020   BMET    Component Value Date/Time   NA 140 08/04/2020 0747   K 3.8 08/04/2020 0747   CL 105 08/04/2020 0747   CO2 28 08/04/2020 0747   GLUCOSE 116 (H) 08/04/2020 0747   BUN 28 (H) 08/04/2020 0747   CREATININE 1.00 08/04/2020 0747   CALCIUM 10.7 (H) 08/04/2020 0747   GFRNONAA >60 08/04/2020 0747    No results found for: INR, PROTIME No results found for: PTT   VITALS: BP 116/69 (BP Location: Right Arm, Patient Position: Sitting, Cuff Size: Normal)   Pulse 69   Temp 98.3 F (36.8 C) (Oral)    EXAM: The area of concern in the lower right quadrant lingual appears to be more extruded, but similar in size to previous visit.  No signs of wound dehiscence or infection evident upon examination. Flaking of necrotic bone noted with curetting.  Mobility of entire bone segment noted during exam.   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   PROCEDURES: Discussed with the patient treatment options of attempting to remove bone under local anesthesia today or wait until the bone works its way out more or entirely on its own.  The patient elected to try and remove bone today.    Removal of necrotic bone (lower right quadrant). Anesthesia: Topical:   Benzocaine 20% applied Type of anesthesia used:  34 mg lidocaine, 0.018 mg epinephrine Location:  Lower left quadrant infiltration and lingual Aspiration negative. Area of necrotic bone removed and excised completely using curette and rongeurs. Irrigated the area with copious amounts of sterile saline and removed all granulation tissue from excision site with curette and rongeurs. Sterile saline irrigation with 4x4 gauze used to achieve hemostasis.  Good hemostasis observed.    The patient tolerated the procedure well.   PLAN: Return in 2-3 weeks for another  follow-up visit to evaluate healing. Rx:  Continue chlorhexidine rinses twice daily for 2-3 days and discontinue other prescriptions. Use warm salt water rinses for pain and to help healing. Call if any questions or concerns arise.   All questions and concerns were invited and addressed.  The patient tolerated today's visit well and departed in stable condition.  [place] Benson Norway, D.M.D.

## 2020-09-01 ENCOUNTER — Other Ambulatory Visit: Payer: Self-pay

## 2020-09-01 ENCOUNTER — Encounter: Payer: Self-pay | Admitting: Hematology & Oncology

## 2020-09-01 ENCOUNTER — Inpatient Hospital Stay: Payer: Medicare Other | Attending: Hematology & Oncology

## 2020-09-01 ENCOUNTER — Inpatient Hospital Stay: Payer: Medicare Other

## 2020-09-01 ENCOUNTER — Inpatient Hospital Stay (HOSPITAL_BASED_OUTPATIENT_CLINIC_OR_DEPARTMENT_OTHER): Payer: Medicare Other | Admitting: Hematology & Oncology

## 2020-09-01 VITALS — BP 96/63 | HR 57 | Temp 98.1°F

## 2020-09-01 VITALS — BP 92/60 | HR 69 | Temp 98.9°F | Resp 16 | Wt 225.0 lb

## 2020-09-01 DIAGNOSIS — Z5112 Encounter for antineoplastic immunotherapy: Secondary | ICD-10-CM | POA: Diagnosis present

## 2020-09-01 DIAGNOSIS — M879 Osteonecrosis, unspecified: Secondary | ICD-10-CM | POA: Diagnosis not present

## 2020-09-01 DIAGNOSIS — M2550 Pain in unspecified joint: Secondary | ICD-10-CM | POA: Insufficient documentation

## 2020-09-01 DIAGNOSIS — C641 Malignant neoplasm of right kidney, except renal pelvis: Secondary | ICD-10-CM | POA: Diagnosis not present

## 2020-09-01 DIAGNOSIS — C649 Malignant neoplasm of unspecified kidney, except renal pelvis: Secondary | ICD-10-CM | POA: Insufficient documentation

## 2020-09-01 DIAGNOSIS — C7951 Secondary malignant neoplasm of bone: Secondary | ICD-10-CM | POA: Insufficient documentation

## 2020-09-01 DIAGNOSIS — M549 Dorsalgia, unspecified: Secondary | ICD-10-CM | POA: Diagnosis not present

## 2020-09-01 DIAGNOSIS — Z87891 Personal history of nicotine dependence: Secondary | ICD-10-CM | POA: Diagnosis not present

## 2020-09-01 DIAGNOSIS — Z7952 Long term (current) use of systemic steroids: Secondary | ICD-10-CM | POA: Insufficient documentation

## 2020-09-01 DIAGNOSIS — Z923 Personal history of irradiation: Secondary | ICD-10-CM | POA: Diagnosis not present

## 2020-09-01 LAB — CBC WITH DIFFERENTIAL (CANCER CENTER ONLY)
Abs Immature Granulocytes: 0.03 10*3/uL (ref 0.00–0.07)
Basophils Absolute: 0 10*3/uL (ref 0.0–0.1)
Basophils Relative: 0 %
Eosinophils Absolute: 0.1 10*3/uL (ref 0.0–0.5)
Eosinophils Relative: 2 %
HCT: 35.4 % — ABNORMAL LOW (ref 39.0–52.0)
Hemoglobin: 11.5 g/dL — ABNORMAL LOW (ref 13.0–17.0)
Immature Granulocytes: 0 %
Lymphocytes Relative: 17 %
Lymphs Abs: 1.3 10*3/uL (ref 0.7–4.0)
MCH: 31.1 pg (ref 26.0–34.0)
MCHC: 32.5 g/dL (ref 30.0–36.0)
MCV: 95.7 fL (ref 80.0–100.0)
Monocytes Absolute: 0.6 10*3/uL (ref 0.1–1.0)
Monocytes Relative: 8 %
Neutro Abs: 5.7 10*3/uL (ref 1.7–7.7)
Neutrophils Relative %: 73 %
Platelet Count: 178 10*3/uL (ref 150–400)
RBC: 3.7 MIL/uL — ABNORMAL LOW (ref 4.22–5.81)
RDW: 12.9 % (ref 11.5–15.5)
WBC Count: 7.8 10*3/uL (ref 4.0–10.5)
nRBC: 0 % (ref 0.0–0.2)

## 2020-09-01 LAB — CMP (CANCER CENTER ONLY)
ALT: 14 U/L (ref 0–44)
AST: 9 U/L — ABNORMAL LOW (ref 15–41)
Albumin: 3.7 g/dL (ref 3.5–5.0)
Alkaline Phosphatase: 71 U/L (ref 38–126)
Anion gap: 6 (ref 5–15)
BUN: 28 mg/dL — ABNORMAL HIGH (ref 8–23)
CO2: 28 mmol/L (ref 22–32)
Calcium: 10.4 mg/dL — ABNORMAL HIGH (ref 8.9–10.3)
Chloride: 107 mmol/L (ref 98–111)
Creatinine: 0.96 mg/dL (ref 0.61–1.24)
GFR, Estimated: >60 mL/min (ref >60)
Glucose, Bld: 104 mg/dL — ABNORMAL HIGH (ref 70–99)
Potassium: 3.9 mmol/L (ref 3.5–5.1)
Sodium: 141 mmol/L (ref 135–145)
Total Bilirubin: 0.4 mg/dL (ref 0.3–1.2)
Total Protein: 5.9 g/dL — ABNORMAL LOW (ref 6.5–8.1)

## 2020-09-01 LAB — LACTATE DEHYDROGENASE: LDH: 131 U/L (ref 98–192)

## 2020-09-01 MED ORDER — SODIUM CHLORIDE 0.9 % IV SOLN
Freq: Once | INTRAVENOUS | Status: AC
  Filled 2020-09-01: qty 250

## 2020-09-01 MED ORDER — SODIUM CHLORIDE 0.9 % IV SOLN
480.0000 mg | Freq: Once | INTRAVENOUS | Status: AC
  Administered 2020-09-01: 480 mg via INTRAVENOUS
  Filled 2020-09-01: qty 48

## 2020-09-01 NOTE — Patient Instructions (Signed)
Nivolumab injection What is this medicine? NIVOLUMAB (nye VOL ue mab) is a monoclonal antibody. It treats certain types of cancer. Some of the cancers treated are colon cancer, head and neck cancer, Hodgkin lymphoma, lung cancer, and melanoma. This medicine may be used for other purposes; ask your health care provider or pharmacist if you have questions. COMMON BRAND NAME(S): Opdivo What should I tell my health care provider before I take this medicine? They need to know if you have any of these conditions:  autoimmune diseases like Crohn's disease, ulcerative colitis, or lupus  have had or planning to have an allogeneic stem cell transplant (uses someone else's stem cells)  history of chest radiation  history of organ transplant  nervous system problems like myasthenia gravis or Guillain-Barre syndrome  an unusual or allergic reaction to nivolumab, other medicines, foods, dyes, or preservatives  pregnant or trying to get pregnant  breast-feeding How should I use this medicine? This medicine is for infusion into a vein. It is given by a health care professional in a hospital or clinic setting. A special MedGuide will be given to you before each treatment. Be sure to read this information carefully each time. Talk to your pediatrician regarding the use of this medicine in children. While this drug may be prescribed for children as young as 12 years for selected conditions, precautions do apply. Overdosage: If you think you have taken too much of this medicine contact a poison control center or emergency room at once. NOTE: This medicine is only for you. Do not share this medicine with others. What if I miss a dose? It is important not to miss your dose. Call your doctor or health care professional if you are unable to keep an appointment. What may interact with this medicine? Interactions have not been studied. This list may not describe all possible interactions. Give your health  care provider a list of all the medicines, herbs, non-prescription drugs, or dietary supplements you use. Also tell them if you smoke, drink alcohol, or use illegal drugs. Some items may interact with your medicine. What should I watch for while using this medicine? This drug may make you feel generally unwell. Continue your course of treatment even though you feel ill unless your doctor tells you to stop. You may need blood work done while you are taking this medicine. Do not become pregnant while taking this medicine or for 5 months after stopping it. Women should inform their doctor if they wish to become pregnant or think they might be pregnant. There is a potential for serious side effects to an unborn child. Talk to your health care professional or pharmacist for more information. Do not breast-feed an infant while taking this medicine or for 5 months after stopping it. What side effects may I notice from receiving this medicine? Side effects that you should report to your doctor or health care professional as soon as possible:  allergic reactions like skin rash, itching or hives, swelling of the face, lips, or tongue  breathing problems  blood in the urine  bloody or watery diarrhea or black, tarry stools  changes in emotions or moods  changes in vision  chest pain  cough  dizziness  feeling faint or lightheaded, falls  fever, chills  headache with fever, neck stiffness, confusion, loss of memory, sensitivity to light, hallucination, loss of contact with reality, or seizures  joint pain  mouth sores  redness, blistering, peeling or loosening of the skin, including inside the   mouth  severe muscle pain or weakness  signs and symptoms of high blood sugar such as dizziness; dry mouth; dry skin; fruity breath; nausea; stomach pain; increased hunger or thirst; increased urination  signs and symptoms of kidney injury like trouble passing urine or change in the amount of  urine  signs and symptoms of liver injury like dark yellow or brown urine; general ill feeling or flu-like symptoms; light-colored stools; loss of appetite; nausea; right upper belly pain; unusually weak or tired; yellowing of the eyes or skin  swelling of the ankles, feet, hands  trouble passing urine or change in the amount of urine  unusually weak or tired  weight gain or loss Side effects that usually do not require medical attention (report to your doctor or health care professional if they continue or are bothersome):  bone pain  constipation  decreased appetite  diarrhea  muscle pain  nausea, vomiting  tiredness This list may not describe all possible side effects. Call your doctor for medical advice about side effects. You may report side effects to FDA at 1-800-FDA-1088. Where should I keep my medicine? This drug is given in a hospital or clinic and will not be stored at home. NOTE: This sheet is a summary. It may not cover all possible information. If you have questions about this medicine, talk to your doctor, pharmacist, or health care provider.  2021 Elsevier/Gold Standard (2019-05-20 10:08:25)  

## 2020-09-02 ENCOUNTER — Encounter: Payer: Self-pay | Admitting: Hematology & Oncology

## 2020-09-02 ENCOUNTER — Telehealth: Payer: Self-pay | Admitting: Hematology & Oncology

## 2020-09-02 ENCOUNTER — Ambulatory Visit (HOSPITAL_COMMUNITY)
Admission: RE | Admit: 2020-09-02 | Discharge: 2020-09-02 | Disposition: A | Discharge status: Home / Self Care | Payer: Medicare Other | Source: Ambulatory Visit | Attending: Hematology & Oncology | Admitting: Hematology & Oncology

## 2020-09-02 DIAGNOSIS — Z981 Arthrodesis status: Secondary | ICD-10-CM | POA: Insufficient documentation

## 2020-09-02 DIAGNOSIS — C641 Malignant neoplasm of right kidney, except renal pelvis: Secondary | ICD-10-CM | POA: Insufficient documentation

## 2020-09-02 LAB — GLUCOSE, CAPILLARY: Glucose-Capillary: 85 mg/dL (ref 70–99)

## 2020-09-02 MED ORDER — LORAZEPAM 1 MG PO TABS: 1.0000 mg | ORAL_TABLET | Freq: Four times a day (QID) | ORAL | 0 refills | Status: DC | PRN

## 2020-09-02 MED ORDER — FLUDEOXYGLUCOSE F - 18 (FDG) INJECTION
11.3000 | Freq: Once | INTRAVENOUS | Status: AC
  Administered 2020-09-02: 11.2 via INTRAVENOUS

## 2020-09-02 NOTE — Progress Notes (Signed)
Hematology and Oncology Follow Up Visit  Esaias Kromer [id number] 1950-10-05 70 y.o. 09/02/2020   Principle Diagnosis:  Metastatic renal cell carcinoma-bone only metastasis  Current Therapy:   Maintenance nivolumab-Q 4-week dosing     Interim History:  Mr. Bak is back for follow-up.  He comes in with his wife.  They are doing quite nicely.  He is still being seen by our dentist to help with his oral issues.  I am unsure if he is going need to have surgery.  I think that we are trying to avoid this.  He is doing pretty well with respect to pain.  He saw some pain in the back.  He has some pain in the right hip.  I think he does go back to [facility] to see the Orthopedic surgeons who have done his surgery.  He has had no problems with nausea or vomiting.  Ativan does seem to help with issues with anxiety.  He has had no issues with respect to the prednisone.  He is on low-dose prednisone I think because of some adrenal insufficiency.  He has had no headache.  There is been no cough or shortness of breath.  He has had no bleeding.  There is been no rashes.  He is due to have his PET scan done on 09/02/2020.  Currently, his performance status is ECOG 1.    Medications:  Current Outpatient Medications:    acetaminophen (TYLENOL) 325 MG tablet, Take 500 mg by mouth every 8 (eight) hours as needed., Disp: , Rfl:    acetaminophen (TYLENOL) 325 MG tablet, Take 975 mg by mouth every 8 (eight) hours., Disp: , Rfl:    alfuzosin (UROXATRAL) 10 MG 24 hr tablet, Take 10 mg by mouth at bedtime., Disp: , Rfl:    ascorbic acid (VITAMIN C) 500 MG tablet, Take 500 mg by mouth in the morning., Disp: , Rfl:    ASPIRIN LOW DOSE 81 MG EC tablet, Take 81 mg by mouth in the morning and at bedtime., Disp: , Rfl:    chlorhexidine (PERIDEX) 0.12 % solution, 15 mLs by Mouth Rinse route 4 (four) times daily., Disp: 1800 mL, Rfl: 2   Cholecalciferol 25 MCG (1000 UT) tablet, Take 25 Units by mouth daily., Disp: , Rfl:     escitalopram (LEXAPRO) 20 MG tablet, Take 1 tablet by mouth daily., Disp: , Rfl:    finasteride (PROSCAR) 5 MG tablet, Take 5 mg by mouth daily., Disp: , Rfl:    guaiFENesin (MUCINEX) 600 MG 12 hr tablet, Take 600 mg by mouth in the morning and at bedtime., Disp: , Rfl:    Hypertonic Nasal Wash (SINUS RINSE) PACK, Irrigate with as directed., Disp: , Rfl:    ipilimumab (YERVOY) 200 MG/40ML SOLN, Inject into the vein., Disp: , Rfl:    lidocaine (XYLOCAINE) 2 % solution, Use as directed 15 mLs in the mouth or throat every 6 (six) hours as needed for mouth pain. Swish and spit 15 mL by mouth every 6 hours as needed for mouth pain., Disp: 100 mL, Rfl: 2   LORazepam (ATIVAN) 1 MG tablet, Take 1 mg by mouth once., Disp: , Rfl:    LORazepam (ATIVAN) 1 MG tablet, Take 1 tablet (1 mg total) by mouth every 6 (six) hours as needed., Disp: 60 tablet, Rfl: 0   MAGNESIUM CARBONATE PO, Take 325 mg by mouth daily., Disp: , Rfl:    melatonin 3 MG TABS tablet, Take 3 mg by mouth at bedtime., Disp: ,  Rfl:    midodrine (PROAMATINE) 2.5 MG tablet, Take 1 tablet (2.5 mg total) by mouth 3 (three) times daily., Disp: 90 tablet, Rfl: 8   montelukast (SINGULAIR) 10 MG tablet, Take 1 tablet by mouth daily., Disp: , Rfl:    Multiple Vitamin (MULTI-VITAMINS) TABS, Take 1 tablet by mouth daily., Disp: , Rfl:    nivolumab (OPDIVO) 100 MG/10ML SOLN chemo injection, Inject into the vein., Disp: , Rfl:    Omega-3 Fatty Acids (FISH OIL PO), Take 5 mLs by mouth daily., Disp: , Rfl:    ondansetron (ZOFRAN-ODT) 4 MG disintegrating tablet, Take 4 mg by mouth every 8 (eight) hours as needed., Disp: , Rfl:    polyethylene glycol powder (GLYCOLAX/MIRALAX) 17 GM/SCOOP powder, Take 17 g by mouth at bedtime., Disp: , Rfl:    Potassium Citrate-Citric Acid 3300-1002 MG PACK, Take by mouth., Disp: , Rfl:    predniSONE (DELTASONE) 10 MG tablet, Take 10 mg by mouth daily., Disp: , Rfl:    prochlorperazine (COMPAZINE) 10 MG tablet, Take 10 mg by  mouth every 6 (six) hours as needed., Disp: , Rfl:    propranolol (INDERAL) 10 MG tablet, Take 10 mg by mouth daily as needed., Disp: , Rfl:    senna-docusate (SENOKOT-S) 8.6-50 MG tablet, Take by mouth., Disp: , Rfl:    vitamin E 180 MG (400 UNITS) capsule, Take 400 Units by mouth every morning., Disp: , Rfl:   Allergies:  Allergies  Allergen Reactions   Morphine Nausea Only    Past Medical History, Surgical history, Social history, and Family History were reviewed and updated.  Review of Systems: Review of Systems  Constitutional: Negative.   HENT:   Positive for mouth sores.   Eyes: Negative.   Respiratory: Negative.    Cardiovascular: Negative.   Gastrointestinal: Negative.   Endocrine: Negative.   Genitourinary: Negative.    Musculoskeletal:  Positive for arthralgias and back pain.  Skin: Negative.   Neurological: Negative.   Hematological: Negative.   Psychiatric/Behavioral: Negative.     Physical Exam:  weight is 225 lb (102.1 kg). His oral temperature is 98.9 F (37.2 C). His blood pressure is 92/60 and his pulse is 69. His respiration is 16 and oxygen saturation is 98%.   Wt Readings from Last 3 Encounters:  09/01/20 225 lb (102.1 kg)  08/04/20 220 lb (99.8 kg)  07/05/20 212 lb (96.2 kg)    Physical Exam Vitals reviewed.  HENT:     Head: Normocephalic and atraumatic.  Eyes:     Pupils: Pupils are equal, round, and reactive to light.  Cardiovascular:     Rate and Rhythm: Normal rate and regular rhythm.     Heart sounds: Normal heart sounds.  Pulmonary:     Effort: Pulmonary effort is normal.     Breath sounds: Normal breath sounds.  Abdominal:     General: Bowel sounds are normal.     Palpations: Abdomen is soft.  Musculoskeletal:        General: No tenderness or deformity. Normal range of motion.     Cervical back: Normal range of motion.  Lymphadenopathy:     Cervical: No cervical adenopathy.  Skin:    General: Skin is warm and dry.     Findings:  No erythema or rash.  Neurological:     Mental Status: He is alert and oriented to person, place, and time.  Psychiatric:        Behavior: Behavior normal.  Thought Content: Thought content normal.        Judgment: Judgment normal.     Lab Results  Component Value Date   WBC 7.8 09/01/2020   HGB 11.5 (L) 09/01/2020   HCT 35.4 (L) 09/01/2020   MCV 95.7 09/01/2020   PLT 178 09/01/2020     Chemistry      Component Value Date/Time   NA 141 09/01/2020 0823   K 3.9 09/01/2020 0823   CL 107 09/01/2020 0823   CO2 28 09/01/2020 0823   BUN 28 (H) 09/01/2020 0823   CREATININE 0.96 09/01/2020 0823      Component Value Date/Time   CALCIUM 10.4 (H) 09/01/2020 0823   ALKPHOS 71 09/01/2020 0823   AST 9 (L) 09/01/2020 0823   ALT 14 09/01/2020 0823   BILITOT 0.4 09/01/2020 0823      Impression and Plan: Mr. Mallia is a very nice 70 year old white male.  He has metastatic renal cell carcinoma.  It seems as if the disease is only in his bones.  He is on immunotherapy.  He is on maintenance nivolumab right now.  It will be interesting to see whether the PET scan shows.  Hopefully, the radiologist will be able to compare this to his prior scans that were done at [facility].  His quality life is doing quite nicely.  I am happy about this.  We will plan to get him back in another month.  Volanda Napoleon, MD 8/5/20222:54 PM

## 2020-09-02 NOTE — Telephone Encounter (Signed)
NO los 8/4

## 2020-09-05 ENCOUNTER — Encounter (HOSPITAL_COMMUNITY): Payer: Self-pay | Admitting: Dentistry

## 2020-09-05 ENCOUNTER — Other Ambulatory Visit: Payer: Self-pay

## 2020-09-05 ENCOUNTER — Ambulatory Visit (INDEPENDENT_AMBULATORY_CARE_PROVIDER_SITE_OTHER): Payer: PRIVATE HEALTH INSURANCE | Admitting: Dentistry

## 2020-09-05 DIAGNOSIS — M87180 Osteonecrosis due to drugs, jaw: Secondary | ICD-10-CM

## 2020-09-05 MED ORDER — HYDROCODONE-ACETAMINOPHEN 5-325 MG PO TABS: 1.0000 | ORAL_TABLET | Freq: Four times a day (QID) | ORAL | 0 refills | Status: AC | PRN

## 2020-09-05 NOTE — Progress Notes (Signed)
Department of Dental Medicine     FOLLOW-UP VISIT  Service Date:   09/05/2020  Patient Name:   Kaleb Boedeker Date of Birth:   Apr 26, 1950 Medical Record Number: [id number]        TODAY'S VISIT:   Assessment:   The patient continues to improve.  Area of concern has completely healed in the lower right quadrant with no signs of recurrence or infection.  Plan:  Follow-up as needed.   Rx: Hydrocodone-acetaminophen to take as needed or at night for severe pain.  Do not take additional Tylenol with this. Schedule an appointment with primary dentist and endodontist for consultation regarding lower right side molars to determine definitive treatment plan.  Discussed in detail all treatment options and recommendations with the patient and they are agreeable to the plan.       PROGRESS NOTE:   COVID-19 SCREENING:  The patient denies symptoms concerning for COVID-19 infection including fever, chills, cough, or newly developed shortness of breath.   HISTORY OF PRESENT ILLNESS Darrie Smutny presents today for a follow-up visit to evaluate healing of MRONJ. Medical and dental history reviewed with the patient.  No changes reported.   CHIEF COMPLAINT:   Patient reports continued pain and throbbing in the lower right quadrant since last visit (points to tooth #31 and #30).  He reports the pain is different from previously with exposed bone, but it is continuous and he still needs to take Tylenol 1000 mg 3 times daily for relief.   Patient Active Problem List   Diagnosis Date Noted   Goals of care, counseling/discussion 05/31/2020   Abnormal findings on diagnostic imaging of other specified body structures 05/11/2020   Arterial hypotension 05/11/2020   Acquired hypothyroidism 04/13/2020   Jaw pain 04/13/2020   Metastatic renal cell carcinoma ([place]) 11/26/2019   BPH (benign prostatic hyperplasia) 11/18/2019   HLD (hyperlipidemia) 11/18/2019   Hypercalcemia of malignancy 11/13/2019    Depression with anxiety 03/12/2017   Hematuria 11/22/2016   Male erectile dysfunction, unspecified 03/23/2014   Past Medical History:  Diagnosis Date   Goals of care, counseling/discussion 05/31/2020   No past surgical history on file. Current Outpatient Medications  Medication Sig Dispense Refill   acetaminophen (TYLENOL) 325 MG tablet Take 975 mg by mouth every 8 (eight) hours.     acetaminophen (TYLENOL) 325 MG tablet Take 500 mg by mouth every 8 (eight) hours as needed.     alfuzosin (UROXATRAL) 10 MG 24 hr tablet Take 10 mg by mouth at bedtime.     ascorbic acid (VITAMIN C) 500 MG tablet Take 500 mg by mouth in the morning.     ASPIRIN LOW DOSE 81 MG EC tablet Take 81 mg by mouth in the morning and at bedtime.     chlorhexidine (PERIDEX) 0.12 % solution 15 mLs by Mouth Rinse route 4 (four) times daily. 1800 mL 2   Cholecalciferol 25 MCG (1000 UT) tablet Take 25 Units by mouth daily.     escitalopram (LEXAPRO) 20 MG tablet Take 1 tablet by mouth daily.     finasteride (PROSCAR) 5 MG tablet Take 5 mg by mouth daily.     guaiFENesin (MUCINEX) 600 MG 12 hr tablet Take 600 mg by mouth in the morning and at bedtime.     Hypertonic Nasal Wash (SINUS RINSE) PACK Irrigate with as directed.     ipilimumab (YERVOY) 200 MG/40ML SOLN Inject into the vein.     lidocaine (XYLOCAINE) 2 % solution Use as directed  15 mLs in the mouth or throat every 6 (six) hours as needed for mouth pain. Swish and spit 15 mL by mouth every 6 hours as needed for mouth pain. 100 mL 2   LORazepam (ATIVAN) 1 MG tablet Take 1 mg by mouth once.     LORazepam (ATIVAN) 1 MG tablet Take 1 tablet (1 mg total) by mouth every 6 (six) hours as needed. 60 tablet 0   MAGNESIUM CARBONATE PO Take 325 mg by mouth daily.     melatonin 3 MG TABS tablet Take 3 mg by mouth at bedtime.     midodrine (PROAMATINE) 2.5 MG tablet Take 1 tablet (2.5 mg total) by mouth 3 (three) times daily. 90 tablet 8   montelukast (SINGULAIR) 10 MG tablet Take  1 tablet by mouth daily.     Multiple Vitamin (MULTI-VITAMINS) TABS Take 1 tablet by mouth daily.     nivolumab (OPDIVO) 100 MG/10ML SOLN chemo injection Inject into the vein.     Omega-3 Fatty Acids (FISH OIL PO) Take 5 mLs by mouth daily.     ondansetron (ZOFRAN-ODT) 4 MG disintegrating tablet Take 4 mg by mouth every 8 (eight) hours as needed.     polyethylene glycol powder (GLYCOLAX/MIRALAX) 17 GM/SCOOP powder Take 17 g by mouth at bedtime.     Potassium Citrate-Citric Acid 3300-1002 MG PACK Take by mouth.     predniSONE (DELTASONE) 10 MG tablet Take 10 mg by mouth daily.     prochlorperazine (COMPAZINE) 10 MG tablet Take 10 mg by mouth every 6 (six) hours as needed.     propranolol (INDERAL) 10 MG tablet Take 10 mg by mouth daily as needed.     senna-docusate (SENOKOT-S) 8.6-50 MG tablet Take by mouth.     vitamin E 180 MG (400 UNITS) capsule Take 400 Units by mouth every morning.     No current facility-administered medications for this visit.   Allergies  Allergen Reactions   Morphine Nausea Only    LABS: Lab Results  Component Value Date   WBC 7.8 09/01/2020   HGB 11.5 (L) 09/01/2020   HCT 35.4 (L) 09/01/2020   MCV 95.7 09/01/2020   PLT 178 09/01/2020   BMET    Component Value Date/Time   NA 141 09/01/2020 0823   K 3.9 09/01/2020 0823   CL 107 09/01/2020 0823   CO2 28 09/01/2020 0823   GLUCOSE 104 (H) 09/01/2020 0823   BUN 28 (H) 09/01/2020 0823   CREATININE 0.96 09/01/2020 0823   CALCIUM 10.4 (H) 09/01/2020 0823   GFRNONAA >60 09/01/2020 0823    No results found for: INR, PROTIME No results found for: PTT   VITALS: BP 105/65 (BP Location: Right Arm, Patient Position: Sitting, Cuff Size: Normal)   Pulse 67   Temp 98.7 F (37.1 C) (Oral)    EXAM: The area of concern in the lower right quadrant has completely healed.  There is no more exposed bone or signs of wound dehiscence or infection evident upon examination. Teeth numbers 30 and 31 (++) to  percussion, mildly sensitive to lingual palpation.   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   PLAN: Follow-up as needed.  The area of exposed bone has completely resolved and healthy mucosa is covering the area.  You can stop salt water rinses and chlorhexidine.  Current and persistent throbbing pain is likely due to teeth that need treatment and not MRONJ.  Rx: NORCO to take for moderate to severe tooth  pain as needed.  Do not take additional Tylenol with this medication. Schedule an appointment as soon as possible with your primary dentist and endodontist for evaluation of teeth numbers 30 and 31.  These teeth may require endodontic re-treatment or extractions based on clinical symptoms.  I will send over all documentation from dental visits at the hospital to their offices.  Call if any questions or concerns arise.  All questions and concerns were invited and addressed.  The patient tolerated today's visit well and departed in stable condition.  [place] Benson Norway, D.M.D.

## 2020-09-06 ENCOUNTER — Encounter (HOSPITAL_COMMUNITY): Payer: Self-pay

## 2020-09-07 ENCOUNTER — Encounter: Payer: Self-pay | Admitting: Hematology & Oncology

## 2020-09-08 ENCOUNTER — Telehealth: Payer: Self-pay | Admitting: *Deleted

## 2020-09-08 NOTE — Telephone Encounter (Signed)
Per 09/01/20 los called and gave upcoming appointments - confirmed

## 2020-09-11 ENCOUNTER — Encounter: Payer: Self-pay | Admitting: Hematology & Oncology

## 2020-09-29 ENCOUNTER — Inpatient Hospital Stay (HOSPITAL_BASED_OUTPATIENT_CLINIC_OR_DEPARTMENT_OTHER): Payer: Medicare Other | Admitting: Hematology & Oncology

## 2020-09-29 ENCOUNTER — Inpatient Hospital Stay: Payer: Medicare Other

## 2020-09-29 ENCOUNTER — Encounter: Payer: Self-pay | Admitting: Hematology & Oncology

## 2020-09-29 ENCOUNTER — Other Ambulatory Visit: Payer: Self-pay

## 2020-09-29 ENCOUNTER — Inpatient Hospital Stay: Payer: Medicare Other | Attending: Hematology & Oncology

## 2020-09-29 VITALS — BP 107/70 | HR 79 | Temp 98.8°F | Resp 20 | Wt 231.4 lb

## 2020-09-29 DIAGNOSIS — Z923 Personal history of irradiation: Secondary | ICD-10-CM | POA: Diagnosis not present

## 2020-09-29 DIAGNOSIS — Z5112 Encounter for antineoplastic immunotherapy: Secondary | ICD-10-CM | POA: Diagnosis present

## 2020-09-29 DIAGNOSIS — C7951 Secondary malignant neoplasm of bone: Secondary | ICD-10-CM | POA: Diagnosis present

## 2020-09-29 DIAGNOSIS — C641 Malignant neoplasm of right kidney, except renal pelvis: Secondary | ICD-10-CM

## 2020-09-29 DIAGNOSIS — M2550 Pain in unspecified joint: Secondary | ICD-10-CM | POA: Insufficient documentation

## 2020-09-29 DIAGNOSIS — C649 Malignant neoplasm of unspecified kidney, except renal pelvis: Secondary | ICD-10-CM | POA: Diagnosis present

## 2020-09-29 DIAGNOSIS — Z87891 Personal history of nicotine dependence: Secondary | ICD-10-CM | POA: Diagnosis not present

## 2020-09-29 DIAGNOSIS — M879 Osteonecrosis, unspecified: Secondary | ICD-10-CM | POA: Insufficient documentation

## 2020-09-29 DIAGNOSIS — Z7952 Long term (current) use of systemic steroids: Secondary | ICD-10-CM | POA: Diagnosis not present

## 2020-09-29 DIAGNOSIS — M549 Dorsalgia, unspecified: Secondary | ICD-10-CM | POA: Insufficient documentation

## 2020-09-29 LAB — CBC WITH DIFFERENTIAL (CANCER CENTER ONLY)
Abs Immature Granulocytes: 0.02 10*3/uL (ref 0.00–0.07)
Basophils Absolute: 0 10*3/uL (ref 0.0–0.1)
Basophils Relative: 1 %
Eosinophils Absolute: 0 10*3/uL (ref 0.0–0.5)
Eosinophils Relative: 1 %
HCT: 38 % — ABNORMAL LOW (ref 39.0–52.0)
Hemoglobin: 12.2 g/dL — ABNORMAL LOW (ref 13.0–17.0)
Immature Granulocytes: 0 %
Lymphocytes Relative: 22 %
Lymphs Abs: 1.3 10*3/uL (ref 0.7–4.0)
MCH: 30.7 pg (ref 26.0–34.0)
MCHC: 32.1 g/dL (ref 30.0–36.0)
MCV: 95.5 fL (ref 80.0–100.0)
Monocytes Absolute: 0.5 10*3/uL (ref 0.1–1.0)
Monocytes Relative: 8 %
Neutro Abs: 4 10*3/uL (ref 1.7–7.7)
Neutrophils Relative %: 68 %
Platelet Count: 187 10*3/uL (ref 150–400)
RBC: 3.98 MIL/uL — ABNORMAL LOW (ref 4.22–5.81)
RDW: 13.4 % (ref 11.5–15.5)
WBC Count: 5.8 10*3/uL (ref 4.0–10.5)
nRBC: 0 % (ref 0.0–0.2)

## 2020-09-29 LAB — CMP (CANCER CENTER ONLY)
ALT: 14 U/L (ref 0–44)
AST: 11 U/L — ABNORMAL LOW (ref 15–41)
Albumin: 4.1 g/dL (ref 3.5–5.0)
Alkaline Phosphatase: 76 U/L (ref 38–126)
Anion gap: 7 (ref 5–15)
BUN: 27 mg/dL — ABNORMAL HIGH (ref 8–23)
CO2: 27 mmol/L (ref 22–32)
Calcium: 10.4 mg/dL — ABNORMAL HIGH (ref 8.9–10.3)
Chloride: 106 mmol/L (ref 98–111)
Creatinine: 0.86 mg/dL (ref 0.61–1.24)
GFR, Estimated: >60 mL/min (ref >60)
Glucose, Bld: 98 mg/dL (ref 70–99)
Potassium: 4.1 mmol/L (ref 3.5–5.1)
Sodium: 140 mmol/L (ref 135–145)
Total Bilirubin: 0.6 mg/dL (ref 0.3–1.2)
Total Protein: 6.5 g/dL (ref 6.5–8.1)

## 2020-09-29 LAB — LACTATE DEHYDROGENASE: LDH: 143 U/L (ref 98–192)

## 2020-09-29 MED ORDER — SODIUM CHLORIDE 0.9 % IV SOLN: Freq: Once | INTRAVENOUS | Status: AC

## 2020-09-29 MED ORDER — SODIUM CHLORIDE 0.9 % IV SOLN
480.0000 mg | Freq: Once | INTRAVENOUS | Status: AC
  Administered 2020-09-29: 480 mg via INTRAVENOUS
  Filled 2020-09-29: qty 48

## 2020-09-29 MED ORDER — HEPARIN SOD (PORK) LOCK FLUSH 100 UNIT/ML IV SOLN: 500.0000 [IU] | Freq: Once | INTRAVENOUS | Status: DC | PRN

## 2020-09-29 MED ORDER — SODIUM CHLORIDE 0.9% FLUSH: 10.0000 mL | INTRAVENOUS | Status: DC | PRN

## 2020-09-29 NOTE — Progress Notes (Signed)
Hematology and Oncology Follow Up Visit  [name] [id number] [birth year] 70 y.o. 09/29/2020   Principle Diagnosis:  Metastatic renal cell carcinoma-bone only metastasis  Current Therapy:   Maintenance nivolumab-Q 4-week dosing     Interim History:  [name] is back for follow-up.  He is doing quite nicely.  We did do a PET scan on him.  This was done a week ago.  The PET scan showed no hypermetabolic metastasis.  He had treated sclerotic lesions.  He had resolution of a right psoas muscle lesion.  I am very pleased with how well he is done.  He has tolerated the nivolumab incredibly well.  He also has much better mouth issues now.  He has been seen our dentist.  Dr. Benson Norway has a fantastic job with him.  He is able to eat much better with much less pain.  He has had no problems with cough or shortness of breath.  He has had no issues with fatigue.  There is no headache.  He has had no visual changes.  He has had no change in bowel or bladder habits.  There is been no leg swelling.  He has had no rashes.  He said he did see the spinal doctors at [facility] who did his spinal fusion.  They seem to be quite pleased with how well he is done.  I think it is easy orthopedic surgeons for his right hip in a week or so.  Overall, I would say his performance status is ECOG 1.    Medications:  Current Outpatient Medications:    Acetaminophen 500 MG capsule, Take 500 mg by mouth every 8 (eight) hours as needed., Disp: , Rfl:    alfuzosin (UROXATRAL) 10 MG 24 hr tablet, Take 10 mg by mouth at bedtime., Disp: , Rfl:    ascorbic acid (VITAMIN C) 500 MG tablet, Take 500 mg by mouth in the morning., Disp: , Rfl:    Cholecalciferol 25 MCG (1000 UT) tablet, Take 25 Units by mouth daily., Disp: , Rfl:    escitalopram (LEXAPRO) 20 MG tablet, Take 1 tablet by mouth daily., Disp: , Rfl:    finasteride (PROSCAR) 5 MG tablet, Take 5 mg by mouth daily., Disp: , Rfl:    guaiFENesin (MUCINEX) 600 MG 12 hr tablet,  Take 600 mg by mouth in the morning and at bedtime., Disp: , Rfl:    Hypertonic Nasal Wash (SINUS RINSE) PACK, Irrigate with as directed., Disp: , Rfl:    MAGNESIUM CARBONATE PO, Take 325 mg by mouth daily., Disp: , Rfl:    melatonin 3 MG TABS tablet, Take 3 mg by mouth at bedtime., Disp: , Rfl:    midodrine (PROAMATINE) 2.5 MG tablet, Take 1 tablet (2.5 mg total) by mouth 3 (three) times daily., Disp: 90 tablet, Rfl: 8   montelukast (SINGULAIR) 10 MG tablet, Take 1 tablet by mouth daily., Disp: , Rfl:    Multiple Vitamin (MULTI-VITAMINS) TABS, Take 1 tablet by mouth daily., Disp: , Rfl:    nivolumab (OPDIVO) 100 MG/10ML SOLN chemo injection, Inject into the vein., Disp: , Rfl:    Omega-3 Fatty Acids (FISH OIL PO), Take 5 mLs by mouth daily., Disp: , Rfl:    polyethylene glycol powder (GLYCOLAX/MIRALAX) 17 GM/SCOOP powder, Take 17 g by mouth at bedtime., Disp: , Rfl:    Potassium Citrate-Citric Acid 3300-1002 MG PACK, Take by mouth daily., Disp: , Rfl:    predniSONE (DELTASONE) 10 MG tablet, Take 10 mg by mouth daily.,  Disp: , Rfl:    propranolol (INDERAL) 10 MG tablet, Take 10 mg by mouth daily as needed., Disp: , Rfl:    vitamin E 180 MG (400 UNITS) capsule, Take 400 Units by mouth every morning., Disp: , Rfl:    LORazepam (ATIVAN) 1 MG tablet, Take 1 tablet (1 mg total) by mouth every 6 (six) hours as needed. (Patient not taking: Reported on 09/29/2020), Disp: 60 tablet, Rfl: 0   ondansetron (ZOFRAN-ODT) 4 MG disintegrating tablet, Take 4 mg by mouth every 8 (eight) hours as needed. (Patient not taking: Reported on 09/29/2020), Disp: , Rfl:    prochlorperazine (COMPAZINE) 10 MG tablet, Take 10 mg by mouth every 6 (six) hours as needed. (Patient not taking: Reported on 09/29/2020), Disp: , Rfl:    senna-docusate (SENOKOT-S) 8.6-50 MG tablet, Take by mouth. (Patient not taking: Reported on 09/29/2020), Disp: , Rfl:   Allergies:  Allergies  Allergen Reactions   Morphine Nausea Only    Past Medical  History, Surgical history, Social history, and Family History were reviewed and updated.  Review of Systems: Review of Systems  Constitutional: Negative.   HENT:   Positive for mouth sores.   Eyes: Negative.   Respiratory: Negative.    Cardiovascular: Negative.   Gastrointestinal: Negative.   Endocrine: Negative.   Genitourinary: Negative.    Musculoskeletal:  Positive for arthralgias and back pain.  Skin: Negative.   Neurological: Negative.   Hematological: Negative.   Psychiatric/Behavioral: Negative.     Physical Exam:  weight is 231 lb 6.4 oz (105 kg). His oral temperature is 98.8 F (37.1 C). His blood pressure is 107/70 and his pulse is 79. His respiration is 20 and oxygen saturation is 100%.   Wt Readings from Last 3 Encounters:  09/29/20 231 lb 6.4 oz (105 kg)  09/01/20 225 lb (102.1 kg)  08/04/20 220 lb (99.8 kg)    Physical Exam Vitals reviewed.  HENT:     Head: Normocephalic and atraumatic.  Eyes:     Pupils: Pupils are equal, round, and reactive to light.  Cardiovascular:     Rate and Rhythm: Normal rate and regular rhythm.     Heart sounds: Normal heart sounds.  Pulmonary:     Effort: Pulmonary effort is normal.     Breath sounds: Normal breath sounds.  Abdominal:     General: Bowel sounds are normal.     Palpations: Abdomen is soft.  Musculoskeletal:        General: No tenderness or deformity. Normal range of motion.     Cervical back: Normal range of motion.  Lymphadenopathy:     Cervical: No cervical adenopathy.  Skin:    General: Skin is warm and dry.     Findings: No erythema or rash.  Neurological:     Mental Status: He is alert and oriented to person, place, and time.  Psychiatric:        Behavior: Behavior normal.        Thought Content: Thought content normal.        Judgment: Judgment normal.     Lab Results  Component Value Date   WBC 5.8 09/29/2020   HGB 12.2 (L) 09/29/2020   HCT 38.0 (L) 09/29/2020   MCV 95.5 09/29/2020   PLT  187 09/29/2020     Chemistry      Component Value Date/Time   NA 140 09/29/2020 1356   K 4.1 09/29/2020 1356   CL 106 09/29/2020 1356   CO2 27 09/29/2020  1356   BUN 27 (H) 09/29/2020 1356   CREATININE 0.86 09/29/2020 1356      Component Value Date/Time   CALCIUM 10.4 (H) 09/29/2020 1356   ALKPHOS 76 09/29/2020 1356   AST 11 (L) 09/29/2020 1356   ALT 14 09/29/2020 1356   BILITOT 0.6 09/29/2020 1356      Impression and Plan: Mr. Berley is a very nice 70 year old white male.  He has metastatic renal cell carcinoma.  Thankfully, he is responding pretty well to immunotherapy.  I would have to say is in remission.  I am very happy to see this.  His quality of life is doing quite nicely right now.  He lives at assisted living.  He really enjoys his being there.  He and his wife do a lot of activities.  We will continue him on the nivolumab monthly.  We could certainly try to move the intervals out a little bit.  For right now, I think every 4-week therapy would be a good way to continue him.    Volanda Napoleon, MD 9/1/20222:58 PM

## 2020-09-29 NOTE — Patient Instructions (Signed)
[facility]  Discharge Instructions: Thank you for choosing [facility] to provide your oncology and hematology care.   If you have a lab appointment with the [facility], please go directly to the [facility] and check in at the registration area.  Wear comfortable clothing and clothing appropriate for easy access to any Portacath or PICC line.   We strive to give you quality time with your provider. You may need to reschedule your appointment if you arrive late (15 or more minutes).  Arriving late affects you and other patients whose appointments are after yours.  Also, if you miss three or more appointments without notifying the office, you may be dismissed from the clinic at the provider's discretion.      For prescription refill requests, have your pharmacy contact our office and allow 72 hours for refills to be completed.    Today you received the following chemotherapy and/or immunotherapy agents: opdivo      To help prevent nausea and vomiting after your treatment, we encourage you to take your nausea medication as directed.  BELOW ARE SYMPTOMS THAT SHOULD BE REPORTED IMMEDIATELY: *FEVER GREATER THAN 100.4 F (38 C) OR HIGHER *CHILLS OR SWEATING *NAUSEA AND VOMITING THAT IS NOT CONTROLLED WITH YOUR NAUSEA MEDICATION *UNUSUAL SHORTNESS OF BREATH *UNUSUAL BRUISING OR BLEEDING *URINARY PROBLEMS (pain or burning when urinating, or frequent urination) *BOWEL PROBLEMS (unusual diarrhea, constipation, pain near the anus) TENDERNESS IN MOUTH AND THROAT WITH OR WITHOUT PRESENCE OF ULCERS (sore throat, sores in mouth, or a toothache) UNUSUAL RASH, SWELLING OR PAIN  UNUSUAL VAGINAL DISCHARGE OR ITCHING   Items with * indicate a potential emergency and should be followed up as soon as possible or go to the Emergency Department if any problems should occur.  Please show the CHEMOTHERAPY ALERT CARD or IMMUNOTHERAPY ALERT CARD at check-in to the  Emergency Department and triage nurse. Should you have questions after your visit or need to cancel or reschedule your appointment, please contact [facility]  [phone] and follow the prompts.  Office hours are 8:00 a.m. to 4:30 p.m. Monday - Friday. Please note that voicemails left after 4:00 p.m. may not be returned until the following business day.  We are closed weekends and major holidays. You have access to a nurse at all times for urgent questions. Please call the main number to the clinic [phone] and follow the prompts.  For any non-urgent questions, you may also contact your provider using MyChart. We now offer e-Visits for anyone 26 and older to request care online for non-urgent symptoms. For details visit [email or web address].   Also download the MyChart app! Go to the app store, search "MyChart", open the app, select [facility], and log in with your MyChart username and password.  Due to Covid, a mask is required upon entering the hospital/clinic. If you do not have a mask, one will be given to you upon arrival. For doctor visits, patients may have 1 support person aged 72 or older with them. For treatment visits, patients cannot have anyone with them due to current Covid guidelines and our immunocompromised population. Nivolumab injection What is this medication? NIVOLUMAB (nye VOL ue mab) is a monoclonal antibody. It treats certain types of cancer. Some of the cancers treated are colon cancer, head and neck cancer,Hodgkin lymphoma, lung cancer, and melanoma. This medicine may be used for other purposes; ask your health care provider orpharmacist if you have  questions. COMMON BRAND NAME(S): Opdivo What should I tell my care team before I take this medication? They need to know if you have any of these conditions: autoimmune diseases like Crohn's disease, ulcerative colitis, or lupus have had or planning to have an allogeneic stem cell transplant  (uses someone else's stem cells) history of chest radiation history of organ transplant nervous system problems like myasthenia gravis or Guillain-Barre syndrome an unusual or allergic reaction to nivolumab, other medicines, foods, dyes, or preservatives pregnant or trying to get pregnant breast-feeding How should I use this medication? This medicine is for infusion into a vein. It is given by a health careprofessional in a hospital or clinic setting. A special MedGuide will be given to you before each treatment. Be sure to readthis information carefully each time. Talk to your pediatrician regarding the use of this medicine in children. While this drug may be prescribed for children as young as 12 years for selectedconditions, precautions do apply. Overdosage: If you think you have taken too much of this medicine contact apoison control center or emergency room at once. NOTE: This medicine is only for you. Do not share this medicine with others. What if I miss a dose? It is important not to miss your dose. Call your doctor or health careprofessional if you are unable to keep an appointment. What may interact with this medication? Interactions have not been studied. This list may not describe all possible interactions. Give your health care provider a list of all the medicines, herbs, non-prescription drugs, or dietary supplements you use. Also tell them if you smoke, drink alcohol, or use illegaldrugs. Some items may interact with your medicine. What should I watch for while using this medication? This drug may make you feel generally unwell. Continue your course of treatmenteven though you feel ill unless your doctor tells you to stop. You may need blood work done while you are taking this medicine. Do not become pregnant while taking this medicine or for 5 months after stopping it. Women should inform their doctor if they wish to become pregnant or think they might be pregnant. There is a  potential for serious side effects to an unborn child. Talk to your health care professional or pharmacist for more information. Do not breast-feed an infant while taking this medicine orfor 5 months after stopping it. What side effects may I notice from receiving this medication? Side effects that you should report to your doctor or health care professionalas soon as possible: allergic reactions like skin rash, itching or hives, swelling of the face, lips, or tongue breathing problems blood in the urine bloody or watery diarrhea or black, tarry stools changes in emotions or moods changes in vision chest pain cough dizziness feeling faint or lightheaded, falls fever, chills headache with fever, neck stiffness, confusion, loss of memory, sensitivity to light, hallucination, loss of contact with reality, or seizures joint pain mouth sores redness, blistering, peeling or loosening of the skin, including inside the mouth severe muscle pain or weakness signs and symptoms of high blood sugar such as dizziness; dry mouth; dry skin; fruity breath; nausea; stomach pain; increased hunger or thirst; increased urination signs and symptoms of kidney injury like trouble passing urine or change in the amount of urine signs and symptoms of liver injury like dark yellow or brown urine; general ill feeling or flu-like symptoms; light-colored stools; loss of appetite; nausea; right upper belly pain; unusually weak or tired; yellowing of the eyes or skin swelling of the  ankles, feet, hands trouble passing urine or change in the amount of urine unusually weak or tired weight gain or loss Side effects that usually do not require medical attention (report to yourdoctor or health care professional if they continue or are bothersome): bone pain constipation decreased appetite diarrhea muscle pain nausea, vomiting tiredness This list may not describe all possible side effects. Call your doctor for medical  advice about side effects. You may report side effects to FDA at1-800-FDA-1088. Where should I keep my medication? This drug is given in a hospital or clinic and will not be stored at home. NOTE: This sheet is a summary. It may not cover all possible information. If you have questions about this medicine, talk to your doctor, pharmacist, orhealth care provider.  2022 Elsevier/Gold Standard (2019-05-20 10:08:25)

## 2020-09-30 ENCOUNTER — Telehealth: Payer: Self-pay

## 2020-10-19 ENCOUNTER — Ambulatory Visit: Payer: Medicare Other | Attending: Internal Medicine

## 2020-10-19 DIAGNOSIS — Z23 Encounter for immunization: Secondary | ICD-10-CM

## 2020-10-19 NOTE — Progress Notes (Signed)
   Covid-19 Vaccination Clinic  Name:  Gilverto Dileonardo    MRN: [id number] DOB: 11-17-1950  10/19/2020  Mr. Fujii was observed post Covid-19 immunization for 15 minutes without incident. He was provided with Vaccine Information Sheet and instruction to access the V-Safe system.   Mr. Osment was instructed to call 911 with any severe reactions post vaccine: Difficulty breathing  Swelling of face and throat  A fast heartbeat  A bad rash all over body  Dizziness and weakness

## 2020-10-25 ENCOUNTER — Other Ambulatory Visit (HOSPITAL_BASED_OUTPATIENT_CLINIC_OR_DEPARTMENT_OTHER): Payer: Self-pay

## 2020-10-25 ENCOUNTER — Encounter: Payer: Self-pay | Admitting: Hematology & Oncology

## 2020-10-25 MED ORDER — COVID-19MRNA BIVAL VACC PFIZER 30 MCG/0.3ML IM SUSP
INTRAMUSCULAR | 0 refills | Status: DC
  Filled 2020-10-25: qty 0.3, 1d supply, fill #0

## 2020-10-26 ENCOUNTER — Inpatient Hospital Stay: Payer: Medicare Other

## 2020-10-26 ENCOUNTER — Other Ambulatory Visit: Payer: Self-pay

## 2020-10-26 ENCOUNTER — Encounter: Payer: Self-pay | Admitting: Hematology & Oncology

## 2020-10-26 ENCOUNTER — Inpatient Hospital Stay (HOSPITAL_BASED_OUTPATIENT_CLINIC_OR_DEPARTMENT_OTHER): Payer: Medicare Other | Admitting: Hematology & Oncology

## 2020-10-26 VITALS — BP 107/66 | HR 72 | Temp 98.5°F | Resp 16 | Wt 235.0 lb

## 2020-10-26 DIAGNOSIS — Z5112 Encounter for antineoplastic immunotherapy: Secondary | ICD-10-CM | POA: Diagnosis not present

## 2020-10-26 DIAGNOSIS — C641 Malignant neoplasm of right kidney, except renal pelvis: Secondary | ICD-10-CM

## 2020-10-26 LAB — CBC WITH DIFFERENTIAL (CANCER CENTER ONLY)
Abs Immature Granulocytes: 0.02 10*3/uL (ref 0.00–0.07)
Basophils Absolute: 0 10*3/uL (ref 0.0–0.1)
Basophils Relative: 0 %
Eosinophils Absolute: 0 10*3/uL (ref 0.0–0.5)
Eosinophils Relative: 1 %
HCT: 36 % — ABNORMAL LOW (ref 39.0–52.0)
Hemoglobin: 11.8 g/dL — ABNORMAL LOW (ref 13.0–17.0)
Immature Granulocytes: 0 %
Lymphocytes Relative: 16 %
Lymphs Abs: 1 10*3/uL (ref 0.7–4.0)
MCH: 30.6 pg (ref 26.0–34.0)
MCHC: 32.8 g/dL (ref 30.0–36.0)
MCV: 93.3 fL (ref 80.0–100.0)
Monocytes Absolute: 0.4 10*3/uL (ref 0.1–1.0)
Monocytes Relative: 6 %
Neutro Abs: 5 10*3/uL (ref 1.7–7.7)
Neutrophils Relative %: 77 %
Platelet Count: 179 10*3/uL (ref 150–400)
RBC: 3.86 MIL/uL — ABNORMAL LOW (ref 4.22–5.81)
RDW: 13.9 % (ref 11.5–15.5)
WBC Count: 6.5 10*3/uL (ref 4.0–10.5)
nRBC: 0 % (ref 0.0–0.2)

## 2020-10-26 LAB — CMP (CANCER CENTER ONLY)
ALT: 18 U/L (ref 0–44)
AST: 11 U/L — ABNORMAL LOW (ref 15–41)
Albumin: 3.9 g/dL (ref 3.5–5.0)
Alkaline Phosphatase: 65 U/L (ref 38–126)
Anion gap: 6 (ref 5–15)
BUN: 29 mg/dL — ABNORMAL HIGH (ref 8–23)
CO2: 27 mmol/L (ref 22–32)
Calcium: 10.1 mg/dL (ref 8.9–10.3)
Chloride: 107 mmol/L (ref 98–111)
Creatinine: 0.89 mg/dL (ref 0.61–1.24)
GFR, Estimated: >60 mL/min (ref >60)
Glucose, Bld: 86 mg/dL (ref 70–99)
Potassium: 4.5 mmol/L (ref 3.5–5.1)
Sodium: 140 mmol/L (ref 135–145)
Total Bilirubin: 0.5 mg/dL (ref 0.3–1.2)
Total Protein: 6.2 g/dL — ABNORMAL LOW (ref 6.5–8.1)

## 2020-10-26 LAB — LACTATE DEHYDROGENASE: LDH: 154 U/L (ref 98–192)

## 2020-10-26 MED ORDER — SODIUM CHLORIDE 0.9 % IV SOLN
480.0000 mg | Freq: Once | INTRAVENOUS | Status: AC
  Administered 2020-10-26: 480 mg via INTRAVENOUS
  Filled 2020-10-26: qty 48

## 2020-10-26 MED ORDER — SODIUM CHLORIDE 0.9 % IV SOLN: Freq: Once | INTRAVENOUS | Status: AC

## 2020-10-26 MED ORDER — PREDNISONE 10 MG PO TABS: 10.0000 mg | ORAL_TABLET | Freq: Every day | ORAL | 6 refills | Status: DC

## 2020-10-26 NOTE — Addendum Note (Signed)
Addended by: Burney Gauze R on: 10/26/2020 01:59 PM   Modules accepted: Orders

## 2020-10-26 NOTE — Patient Instructions (Signed)
[facility]  Discharge Instructions: Thank you for choosing [facility] to provide your oncology and hematology care.   If you have a lab appointment with the Cancer Center, please go directly to the Cancer Center and check in at the registration area.  Wear comfortable clothing and clothing appropriate for easy access to any Portacath or PICC line.   We strive to give you quality time with your provider. You may need to reschedule your appointment if you arrive late (15 or more minutes).  Arriving late affects you and other patients whose appointments are after yours.  Also, if you miss three or more appointments without notifying the office, you may be dismissed from the clinic at the provider's discretion.      For prescription refill requests, have your pharmacy contact our office and allow 72 hours for refills to be completed.    Today you received the following chemotherapy and/or immunotherapy agents opdivo    To help prevent nausea and vomiting after your treatment, we encourage you to take your nausea medication as directed.  BELOW ARE SYMPTOMS THAT SHOULD BE REPORTED IMMEDIATELY: *FEVER GREATER THAN 100.4 F (38 C) OR HIGHER *CHILLS OR SWEATING *NAUSEA AND VOMITING THAT IS NOT CONTROLLED WITH YOUR NAUSEA MEDICATION *UNUSUAL SHORTNESS OF BREATH *UNUSUAL BRUISING OR BLEEDING *URINARY PROBLEMS (pain or burning when urinating, or frequent urination) *BOWEL PROBLEMS (unusual diarrhea, constipation, pain near the anus) TENDERNESS IN MOUTH AND THROAT WITH OR WITHOUT PRESENCE OF ULCERS (sore throat, sores in mouth, or a toothache) UNUSUAL RASH, SWELLING OR PAIN  UNUSUAL VAGINAL DISCHARGE OR ITCHING   Items with * indicate a potential emergency and should be followed up as soon as possible or go to the Emergency Department if any problems should occur.  Please show the CHEMOTHERAPY ALERT CARD or IMMUNOTHERAPY ALERT CARD at check-in to the  Emergency Department and triage nurse. Should you have questions after your visit or need to cancel or reschedule your appointment, please contact [facility]  [phone] and follow the prompts.  Office hours are 8:00 a.m. to 4:30 p.m. Monday - Friday. Please note that voicemails left after 4:00 p.m. may not be returned until the following business day.  We are closed weekends and major holidays. You have access to a nurse at all times for urgent questions. Please call the main number to the clinic [phone] and follow the prompts.  For any non-urgent questions, you may also contact your provider using MyChart. We now offer e-Visits for anyone 18 and older to request care online for non-urgent symptoms. For details visit [email or web address].   Also download the MyChart app! Go to the app store, search "MyChart", open the app, select [facility], and log in with your MyChart username and password.  Due to Covid, a mask is required upon entering the hospital/clinic. If you do not have a mask, one will be given to you upon arrival. For doctor visits, patients may have 1 support person aged 18 or older with them. For treatment visits, patients cannot have anyone with them due to current Covid guidelines and our immunocompromised population.  

## 2020-10-26 NOTE — Progress Notes (Signed)
Hematology and Oncology Follow Up Visit  Francis Cunningham [id number] 07/10/1950 70 y.o. 10/26/2020   Principle Diagnosis:  Metastatic renal cell carcinoma-bone only metastasis  Current Therapy:   Maintenance nivolumab-Q 4-week dosing     Interim History:  [name] is back for follow-up.  He comes in with his wife.  As always, they are such a fun couple to talk to.  They really enjoy themselves.  They really enjoy their life at [facility].  That a wonderful Labor Day weekend.  He is doing well.  He is not having any facial pain.  He is having no problems with jaw pain.  He did see his a Optometrist in [place].  He apparently made some adjustments to his bite.  He has had no problems with pain.  He really does not even take much in the way of Tylenol.  He has had a good appetite.  He has had no problems swallowing.  There is no change in bowel or bladder habits.  Has been no rashes.  He has had no issues with headache.  He has had no visual changes.  Overall, I would have to say his performance status is probably ECOG 0.     Medications:  Current Outpatient Medications:    Acetaminophen 500 MG capsule, Take 500 mg by mouth every 8 (eight) hours as needed., Disp: , Rfl:    alfuzosin (UROXATRAL) 10 MG 24 hr tablet, Take 10 mg by mouth at bedtime., Disp: , Rfl:    ascorbic acid (VITAMIN C) 500 MG tablet, Take 500 mg by mouth in the morning., Disp: , Rfl:    Cholecalciferol 25 MCG (1000 UT) tablet, Take 25 Units by mouth daily., Disp: , Rfl:    COVID-19 mRNA bivalent vaccine, Pfizer, injection, Inject into the muscle., Disp: 0.3 mL, Rfl: 0   escitalopram (LEXAPRO) 20 MG tablet, Take 1 tablet by mouth daily., Disp: , Rfl:    finasteride (PROSCAR) 5 MG tablet, Take 5 mg by mouth daily., Disp: , Rfl:    guaiFENesin (MUCINEX) 600 MG 12 hr tablet, Take 600 mg by mouth in the morning and at bedtime., Disp: , Rfl:    Hypertonic Nasal Wash (SINUS RINSE) PACK, Irrigate with as directed., Disp: ,  Rfl:    LORazepam (ATIVAN) 1 MG tablet, Take 1 tablet (1 mg total) by mouth every 6 (six) hours as needed. (Patient not taking: Reported on 09/29/2020), Disp: 60 tablet, Rfl: 0   MAGNESIUM CARBONATE PO, Take 325 mg by mouth daily., Disp: , Rfl:    melatonin 3 MG TABS tablet, Take 3 mg by mouth at bedtime., Disp: , Rfl:    midodrine (PROAMATINE) 2.5 MG tablet, Take 1 tablet (2.5 mg total) by mouth 3 (three) times daily., Disp: 90 tablet, Rfl: 8   montelukast (SINGULAIR) 10 MG tablet, Take 1 tablet by mouth daily., Disp: , Rfl:    Multiple Vitamin (MULTI-VITAMINS) TABS, Take 1 tablet by mouth daily., Disp: , Rfl:    nivolumab (OPDIVO) 100 MG/10ML SOLN chemo injection, Inject into the vein., Disp: , Rfl:    Omega-3 Fatty Acids (FISH OIL PO), Take 5 mLs by mouth daily., Disp: , Rfl:    ondansetron (ZOFRAN-ODT) 4 MG disintegrating tablet, Take 4 mg by mouth every 8 (eight) hours as needed. (Patient not taking: Reported on 09/29/2020), Disp: , Rfl:    polyethylene glycol powder (GLYCOLAX/MIRALAX) 17 GM/SCOOP powder, Take 17 g by mouth at bedtime., Disp: , Rfl:    Potassium Citrate-Citric Acid 3300-1002  MG PACK, Take by mouth daily., Disp: , Rfl:    predniSONE (DELTASONE) 10 MG tablet, Take 10 mg by mouth daily., Disp: , Rfl:    prochlorperazine (COMPAZINE) 10 MG tablet, Take 10 mg by mouth every 6 (six) hours as needed. (Patient not taking: Reported on 09/29/2020), Disp: , Rfl:    propranolol (INDERAL) 10 MG tablet, Take 10 mg by mouth daily as needed., Disp: , Rfl:    senna-docusate (SENOKOT-S) 8.6-50 MG tablet, Take by mouth. (Patient not taking: Reported on 09/29/2020), Disp: , Rfl:    vitamin E 180 MG (400 UNITS) capsule, Take 400 Units by mouth every morning., Disp: , Rfl:   Allergies:  Allergies  Allergen Reactions   Morphine Nausea Only    Past Medical History, Surgical history, Social history, and Family History were reviewed and updated.  Review of Systems: Review of Systems  Constitutional:  Negative.   HENT:   Positive for mouth sores.   Eyes: Negative.   Respiratory: Negative.    Cardiovascular: Negative.   Gastrointestinal: Negative.   Endocrine: Negative.   Genitourinary: Negative.    Musculoskeletal:  Positive for arthralgias and back pain.  Skin: Negative.   Neurological: Negative.   Hematological: Negative.   Psychiatric/Behavioral: Negative.     Physical Exam:  weight is 235 lb (106.6 kg). His oral temperature is 98.5 F (36.9 C). His blood pressure is 107/66 and his pulse is 72. His respiration is 16 and oxygen saturation is 96%.   Wt Readings from Last 3 Encounters:  10/26/20 235 lb (106.6 kg)  09/29/20 231 lb 6.4 oz (105 kg)  09/01/20 225 lb (102.1 kg)    Physical Exam Vitals reviewed.  HENT:     Head: Normocephalic and atraumatic.  Eyes:     Pupils: Pupils are equal, round, and reactive to light.  Cardiovascular:     Rate and Rhythm: Normal rate and regular rhythm.     Heart sounds: Normal heart sounds.  Pulmonary:     Effort: Pulmonary effort is normal.     Breath sounds: Normal breath sounds.  Abdominal:     General: Bowel sounds are normal.     Palpations: Abdomen is soft.  Musculoskeletal:        General: No tenderness or deformity. Normal range of motion.     Cervical back: Normal range of motion.  Lymphadenopathy:     Cervical: No cervical adenopathy.  Skin:    General: Skin is warm and dry.     Findings: No erythema or rash.  Neurological:     Mental Status: He is alert and oriented to person, place, and time.  Psychiatric:        Behavior: Behavior normal.        Thought Content: Thought content normal.        Judgment: Judgment normal.     Lab Results  Component Value Date   WBC 6.5 10/26/2020   HGB 11.8 (L) 10/26/2020   HCT 36.0 (L) 10/26/2020   MCV 93.3 10/26/2020   PLT 179 10/26/2020     Chemistry      Component Value Date/Time   NA 140 10/26/2020 1303   K 4.5 10/26/2020 1303   CL 107 10/26/2020 1303   CO2 27  10/26/2020 1303   BUN 29 (H) 10/26/2020 1303   CREATININE 0.89 10/26/2020 1303      Component Value Date/Time   CALCIUM 10.1 10/26/2020 1303   ALKPHOS 65 10/26/2020 1303   AST 11 (L) 10/26/2020  1303   ALT 18 10/26/2020 1303   BILITOT 0.5 10/26/2020 1303      Impression and Plan: Mr. Gitlin is a very nice 70 year old white male.  He has metastatic renal cell carcinoma.  Thankfully, he is responding pretty well to immunotherapy.  I would have to say is in remission.  I am very happy to see this.  I am just very happy that his quality of life is doing so well right now.  He is enjoying himself.  He is pretty much able to do what he would like to do.  He is eating well.  He is gaining some weight.  We will continue to follow him along with monthly.  I do not think we have to do another scan probably until December.    Volanda Napoleon, MD 9/28/20221:48 PM

## 2020-10-28 ENCOUNTER — Other Ambulatory Visit: Payer: Medicare Other

## 2020-10-28 ENCOUNTER — Ambulatory Visit: Payer: Medicare Other | Admitting: Hematology & Oncology

## 2020-10-28 ENCOUNTER — Ambulatory Visit: Payer: Medicare Other

## 2020-11-01 ENCOUNTER — Other Ambulatory Visit: Payer: Self-pay

## 2020-11-01 ENCOUNTER — Inpatient Hospital Stay (HOSPITAL_COMMUNITY)
Admission: EM | Admit: 2020-11-01 | Discharge: 2020-11-04 | DRG: 071 | Disposition: A | Discharge status: Home / Self Care | Payer: Medicare Other | Attending: Internal Medicine | Admitting: Internal Medicine

## 2020-11-01 ENCOUNTER — Emergency Department (HOSPITAL_COMMUNITY): Payer: Medicare Other

## 2020-11-01 ENCOUNTER — Encounter (HOSPITAL_COMMUNITY): Payer: Self-pay

## 2020-11-01 DIAGNOSIS — C649 Malignant neoplasm of unspecified kidney, except renal pelvis: Secondary | ICD-10-CM | POA: Diagnosis present

## 2020-11-01 DIAGNOSIS — R6510 Systemic inflammatory response syndrome (SIRS) of non-infectious origin without acute organ dysfunction: Secondary | ICD-10-CM | POA: Diagnosis present

## 2020-11-01 DIAGNOSIS — N400 Enlarged prostate without lower urinary tract symptoms: Secondary | ICD-10-CM | POA: Diagnosis present

## 2020-11-01 DIAGNOSIS — C7951 Secondary malignant neoplasm of bone: Secondary | ICD-10-CM | POA: Diagnosis present

## 2020-11-01 DIAGNOSIS — E039 Hypothyroidism, unspecified: Secondary | ICD-10-CM | POA: Diagnosis present

## 2020-11-01 DIAGNOSIS — F418 Other specified anxiety disorders: Secondary | ICD-10-CM | POA: Diagnosis present

## 2020-11-01 DIAGNOSIS — I959 Hypotension, unspecified: Secondary | ICD-10-CM | POA: Diagnosis present

## 2020-11-01 DIAGNOSIS — R410 Disorientation, unspecified: Secondary | ICD-10-CM

## 2020-11-01 DIAGNOSIS — G9340 Encephalopathy, unspecified: Secondary | ICD-10-CM | POA: Diagnosis present

## 2020-11-01 DIAGNOSIS — Z79899 Other long term (current) drug therapy: Secondary | ICD-10-CM

## 2020-11-01 DIAGNOSIS — D849 Immunodeficiency, unspecified: Secondary | ICD-10-CM | POA: Diagnosis present

## 2020-11-01 DIAGNOSIS — Z7952 Long term (current) use of systemic steroids: Secondary | ICD-10-CM

## 2020-11-01 DIAGNOSIS — Z87891 Personal history of nicotine dependence: Secondary | ICD-10-CM

## 2020-11-01 DIAGNOSIS — E785 Hyperlipidemia, unspecified: Secondary | ICD-10-CM | POA: Diagnosis present

## 2020-11-01 DIAGNOSIS — F419 Anxiety disorder, unspecified: Secondary | ICD-10-CM | POA: Diagnosis present

## 2020-11-01 DIAGNOSIS — F32A Depression, unspecified: Secondary | ICD-10-CM | POA: Diagnosis present

## 2020-11-01 DIAGNOSIS — R531 Weakness: Secondary | ICD-10-CM

## 2020-11-01 DIAGNOSIS — Z885 Allergy status to narcotic agent status: Secondary | ICD-10-CM

## 2020-11-01 DIAGNOSIS — A419 Sepsis, unspecified organism: Secondary | ICD-10-CM | POA: Diagnosis present

## 2020-11-01 DIAGNOSIS — G9341 Metabolic encephalopathy: Principal | ICD-10-CM | POA: Diagnosis present

## 2020-11-01 DIAGNOSIS — Z20822 Contact with and (suspected) exposure to covid-19: Secondary | ICD-10-CM | POA: Diagnosis present

## 2020-11-01 DIAGNOSIS — I9589 Other hypotension: Secondary | ICD-10-CM | POA: Diagnosis present

## 2020-11-01 HISTORY — DX: Benign prostatic hyperplasia without lower urinary tract symptoms: N40.0

## 2020-11-01 HISTORY — DX: Cardiac murmur, unspecified: R01.1

## 2020-11-01 HISTORY — DX: Malignant neoplasm of unspecified kidney, except renal pelvis: C64.9

## 2020-11-01 HISTORY — DX: Hypothyroidism, unspecified: E03.9

## 2020-11-01 HISTORY — DX: Hyperlipidemia, unspecified: E78.5

## 2020-11-01 MED ORDER — LACTATED RINGERS IV BOLUS (SEPSIS)
1000.0000 mL | Freq: Once | INTRAVENOUS | Status: AC
  Administered 2020-11-02: 1000 mL via INTRAVENOUS

## 2020-11-01 MED ORDER — MIDODRINE HCL 2.5 MG PO TABS
2.5000 mg | ORAL_TABLET | Freq: Once | ORAL | Status: AC
  Administered 2020-11-02: 2.5 mg via ORAL
  Filled 2020-11-01: qty 1

## 2020-11-01 MED ORDER — SODIUM CHLORIDE 0.9 % IV SOLN
2.0000 g | Freq: Once | INTRAVENOUS | Status: AC
  Administered 2020-11-02: 2 g via INTRAVENOUS
  Filled 2020-11-01: qty 20

## 2020-11-01 MED ORDER — LACTATED RINGERS IV BOLUS (SEPSIS): 1000.0000 mL | Freq: Once | INTRAVENOUS | Status: DC

## 2020-11-01 MED ORDER — LACTATED RINGERS IV BOLUS (SEPSIS)
500.0000 mL | Freq: Once | INTRAVENOUS | Status: DC
Start: 2020-11-02 — End: 2020-11-01

## 2020-11-01 NOTE — ED Provider Notes (Signed)
[facility] Emergency Department Provider Note MRN:  [id number]  Arrival date & time: 11/02/20     Chief Complaint   Weakness (Pt has been weak today and has no appetite, had his flu shot yesterday and just does not feel himself, did have a low grade fever at the home)   History of Present Illness   Francis Cunningham is a 70 y.o. year-old male with a history of stage IV renal cell carcinoma presenting to the ED with chief complaint of confusion.  Got his flu shot yesterday, today with confusion, dry heaves, no bowel movements, fever up to 102 at home, not walking normally.  Denies any chest pain, no shortness of breath.  No cough.  No abdominal pain, no dysuria.  Review of Systems  A complete 10 system review of systems was obtained and all systems are negative except as noted in the HPI and PMH.   Patient's Health History    Past Medical History:  Diagnosis Date   Goals of care, counseling/discussion 05/31/2020    History reviewed. No pertinent surgical history.  History reviewed. No pertinent family history.  Social History   Socioeconomic History   Marital status: Married    Spouse name: Not on file   Number of children: Not on file   Years of education: Not on file   Highest education level: Not on file  Occupational History   Not on file  Tobacco Use   Smoking status: Former    Packs/day: 0.50    Years: 25.00    Pack years: 12.50    Types: Cigarettes    Quit date: 01/27/1997    Years since quitting: 23.7   Smokeless tobacco: Never  Vaping Use   Vaping Use: Never used  Substance and Sexual Activity   Alcohol use: Not on file   Drug use: Not on file   Sexual activity: Not on file  Other Topics Concern   Not on file  Social History Narrative   Not on file   Social Determinants of Health   Financial Resource Strain: Not on file  Food Insecurity: Not on file  Transportation Needs: Not on file  Physical Activity: Not on file  Stress: Not on  file  Social Connections: Not on file  Intimate Partner Violence: Not on file     Physical Exam   Vitals:   11/02/20 0100 11/02/20 0130  BP: 91/62 (!) 81/57  Pulse: 82 70  Resp: 19 (!) 21  Temp:    SpO2: 98% 97%    CONSTITUTIONAL: Well-appearing, NAD NEURO:  Alert and oriented x 3, normal and symmetric strength and sensation, normal coordination, normal speech EYES:  eyes equal and reactive ENT/NECK:  no LAD, no JVD CARDIO: Regular rate, well-perfused, normal S1 and S2 PULM:  CTAB no wheezing or rhonchi GI/GU:  normal bowel sounds, non-distended, non-tender MSK/SPINE:  No gross deformities, no edema SKIN:  no rash, atraumatic PSYCH:  Appropriate speech and behavior  *Additional and/or pertinent findings included in MDM below  Diagnostic and Interventional Summary    EKG Interpretation  Date/Time:  Tuesday November 01 2020 22:27:06 EDT Ventricular Rate:  96 PR Interval:  164 QRS Duration: 179 QT Interval:  402 QTC Calculation: 508 R Axis:   88 Text Interpretation: Sinus rhythm Right bundle branch block No previous ECGs available Confirmed by Gerlene Fee [phone]) on 11/01/2020 11:54:35 PM       Labs Reviewed  LACTIC ACID, PLASMA - Abnormal; Notable for the following components:  Result Value   Lactic Acid, Venous 2.2 (*)    All other components within normal limits  COMPREHENSIVE METABOLIC PANEL - Abnormal; Notable for the following components:   Sodium 132 (*)    Potassium 3.0 (*)    Glucose, Bld 120 (*)    Total Protein 6.1 (*)    All other components within normal limits  CBC WITH DIFFERENTIAL/PLATELET - Abnormal; Notable for the following components:   RBC 4.15 (*)    Hemoglobin 12.7 (*)    HCT 38.5 (*)    Platelets 130 (*)    Lymphs Abs 0.5 (*)    All other components within normal limits  URINALYSIS, ROUTINE W REFLEX MICROSCOPIC - Abnormal; Notable for the following components:   Ketones, ur 5 (*)    All other components within normal limits   RESP PANEL BY RT-PCR (FLU A&B, COVID) ARPGX2  CULTURE, BLOOD (ROUTINE X 2)  CULTURE, BLOOD (ROUTINE X 2)  URINE CULTURE  PROTIME-INR  APTT  LACTIC ACID, PLASMA  TROPONIN I (HIGH SENSITIVITY)  TROPONIN I (HIGH SENSITIVITY)    DG Chest 1 View  Final Result    CT HEAD WO CONTRAST (5MM)  Final Result    CT ABDOMEN PELVIS WO CONTRAST  Final Result      Medications  lactated ringers bolus 1,000 mL (0 mLs Intravenous Stopped 11/02/20 0126)    And  lactated ringers bolus 1,000 mL (0 mLs Intravenous Stopped 11/02/20 0132)  cefTRIAXone (ROCEPHIN) 2 g in sodium chloride 0.9 % 100 mL IVPB (0 g Intravenous Stopped 11/02/20 0126)  midodrine (PROAMATINE) tablet 2.5 mg (2.5 mg Oral Given 11/02/20 0057)     Procedures  /  Critical Care .Critical Care Performed by: Maudie Flakes, MD Authorized by: Maudie Flakes, MD   Critical care provider statement:    Critical care time (minutes):  45   Critical care was necessary to treat or prevent imminent or life-threatening deterioration of the following conditions:  Sepsis   Critical care was time spent personally by me on the following activities:  Discussions with consultants, evaluation of patient's response to treatment, examination of patient, ordering and performing treatments and interventions, ordering and review of laboratory studies, ordering and review of radiographic studies, pulse oximetry, re-evaluation of patient's condition, obtaining history from patient or surrogate and review of old charts  ED Course and Medical Decision Making  I have reviewed the triage vital signs, the nursing notes, and pertinent available records from the EMR.  Listed above are laboratory and imaging tests that I personally ordered, reviewed, and interpreted and then considered in my medical decision making (see below for details).  Concern for sepsis given the confusion, fever, hypotension here in the emergency department.  Does take midodrine chronically,  missed his meds today due to not feeling well.  Code sepsis initiated, providing fluids.  There was report of asymmetric leg weakness not present currently, will obtain screening CT head.  Also given the retching and decreased bowel movements and history of advanced cancer will obtain CT abdomen.     CT imaging is reassuring.  Labs reveal mild hyponatremia, mildly elevated lactate.  No clear source of fever, will admit to medicine for further care.  Barth Kirks. Sedonia Small, [place] [email or web address]  Final Clinical Impressions(s) / ED Diagnoses     ICD-10-CM   1. Weakness  R53.1     2. Sepsis ([place])  A41.9 DG Chest 1 View  DG Chest 1 View    3. Confusion  R41.0     4. Hypotension, unspecified hypotension type  I95.9       ED Discharge Orders     None        Discharge Instructions Discussed with and Provided to Patient:   Discharge Instructions   None       Maudie Flakes, MD 11/02/20 [phone]

## 2020-11-01 NOTE — ED Triage Notes (Signed)
Pt is alert and oriented x 4 just does not feel well

## 2020-11-02 ENCOUNTER — Emergency Department (HOSPITAL_COMMUNITY): Payer: Medicare Other

## 2020-11-02 ENCOUNTER — Encounter (HOSPITAL_COMMUNITY): Payer: Self-pay | Admitting: Internal Medicine

## 2020-11-02 DIAGNOSIS — E039 Hypothyroidism, unspecified: Secondary | ICD-10-CM | POA: Diagnosis present

## 2020-11-02 DIAGNOSIS — Z87891 Personal history of nicotine dependence: Secondary | ICD-10-CM | POA: Diagnosis not present

## 2020-11-02 DIAGNOSIS — R509 Fever, unspecified: Secondary | ICD-10-CM

## 2020-11-02 DIAGNOSIS — A419 Sepsis, unspecified organism: Secondary | ICD-10-CM | POA: Diagnosis present

## 2020-11-02 DIAGNOSIS — R410 Disorientation, unspecified: Secondary | ICD-10-CM | POA: Diagnosis present

## 2020-11-02 DIAGNOSIS — C649 Malignant neoplasm of unspecified kidney, except renal pelvis: Secondary | ICD-10-CM | POA: Diagnosis present

## 2020-11-02 DIAGNOSIS — N400 Enlarged prostate without lower urinary tract symptoms: Secondary | ICD-10-CM | POA: Diagnosis present

## 2020-11-02 DIAGNOSIS — I959 Hypotension, unspecified: Secondary | ICD-10-CM

## 2020-11-02 DIAGNOSIS — Z79899 Other long term (current) drug therapy: Secondary | ICD-10-CM | POA: Diagnosis not present

## 2020-11-02 DIAGNOSIS — F419 Anxiety disorder, unspecified: Secondary | ICD-10-CM | POA: Diagnosis present

## 2020-11-02 DIAGNOSIS — I9589 Other hypotension: Secondary | ICD-10-CM | POA: Diagnosis present

## 2020-11-02 DIAGNOSIS — C641 Malignant neoplasm of right kidney, except renal pelvis: Secondary | ICD-10-CM

## 2020-11-02 DIAGNOSIS — D849 Immunodeficiency, unspecified: Secondary | ICD-10-CM | POA: Diagnosis present

## 2020-11-02 DIAGNOSIS — G9341 Metabolic encephalopathy: Secondary | ICD-10-CM | POA: Diagnosis present

## 2020-11-02 DIAGNOSIS — Z7952 Long term (current) use of systemic steroids: Secondary | ICD-10-CM

## 2020-11-02 DIAGNOSIS — R6510 Systemic inflammatory response syndrome (SIRS) of non-infectious origin without acute organ dysfunction: Secondary | ICD-10-CM | POA: Diagnosis present

## 2020-11-02 DIAGNOSIS — G9340 Encephalopathy, unspecified: Secondary | ICD-10-CM | POA: Diagnosis not present

## 2020-11-02 DIAGNOSIS — E785 Hyperlipidemia, unspecified: Secondary | ICD-10-CM | POA: Diagnosis present

## 2020-11-02 DIAGNOSIS — Z20822 Contact with and (suspected) exposure to covid-19: Secondary | ICD-10-CM | POA: Diagnosis present

## 2020-11-02 DIAGNOSIS — F418 Other specified anxiety disorders: Secondary | ICD-10-CM | POA: Diagnosis not present

## 2020-11-02 DIAGNOSIS — C7951 Secondary malignant neoplasm of bone: Secondary | ICD-10-CM | POA: Diagnosis present

## 2020-11-02 DIAGNOSIS — Z885 Allergy status to narcotic agent status: Secondary | ICD-10-CM | POA: Diagnosis not present

## 2020-11-02 DIAGNOSIS — F32A Depression, unspecified: Secondary | ICD-10-CM | POA: Diagnosis present

## 2020-11-02 LAB — COMPREHENSIVE METABOLIC PANEL
ALT: 18 U/L (ref 0–44)
ALT: 23 U/L (ref 0–44)
AST: 18 U/L (ref 15–41)
AST: 20 U/L (ref 15–41)
Albumin: 3.6 g/dL (ref 3.5–5.0)
Albumin: 3.5 g/dL (ref 3.5–5.0)
Alkaline Phosphatase: 73 U/L (ref 38–126)
Alkaline Phosphatase: 73 U/L (ref 38–126)
Anion gap: 8 (ref 5–15)
Anion gap: 11 (ref 5–15)
BUN: 18 mg/dL (ref 8–23)
BUN: 11 mg/dL (ref 8–23)
CO2: 23 mmol/L (ref 22–32)
CO2: 23 mmol/L (ref 22–32)
Calcium: 9.6 mg/dL (ref 8.9–10.3)
Calcium: 9.3 mg/dL (ref 8.9–10.3)
Chloride: 101 mmol/L (ref 98–111)
Chloride: 102 mmol/L (ref 98–111)
Creatinine, Ser: 0.95 mg/dL (ref 0.61–1.24)
Creatinine, Ser: 0.85 mg/dL (ref 0.61–1.24)
GFR, Estimated: >60 mL/min (ref >60)
GFR, Estimated: >60 mL/min (ref >60)
Glucose, Bld: 120 mg/dL — ABNORMAL HIGH (ref 70–99)
Glucose, Bld: 104 mg/dL — ABNORMAL HIGH (ref 70–99)
Potassium: 3 mmol/L — ABNORMAL LOW (ref 3.5–5.1)
Potassium: 3.6 mmol/L (ref 3.5–5.1)
Sodium: 132 mmol/L — ABNORMAL LOW (ref 135–145)
Sodium: 136 mmol/L (ref 135–145)
Total Bilirubin: 1 mg/dL (ref 0.3–1.2)
Total Bilirubin: 1.4 mg/dL — ABNORMAL HIGH (ref 0.3–1.2)
Total Protein: 6.1 g/dL — ABNORMAL LOW (ref 6.5–8.1)
Total Protein: 6.4 g/dL — ABNORMAL LOW (ref 6.5–8.1)

## 2020-11-02 LAB — CBC WITH DIFFERENTIAL/PLATELET
Abs Immature Granulocytes: 0.01 10*3/uL (ref 0.00–0.07)
Basophils Absolute: 0 10*3/uL (ref 0.0–0.1)
Basophils Relative: 1 %
Eosinophils Absolute: 0 10*3/uL (ref 0.0–0.5)
Eosinophils Relative: 1 %
HCT: 38.5 % — ABNORMAL LOW (ref 39.0–52.0)
Hemoglobin: 12.7 g/dL — ABNORMAL LOW (ref 13.0–17.0)
Immature Granulocytes: 0 %
Lymphocytes Relative: 13 %
Lymphs Abs: 0.5 10*3/uL — ABNORMAL LOW (ref 0.7–4.0)
MCH: 30.6 pg (ref 26.0–34.0)
MCHC: 33 g/dL (ref 30.0–36.0)
MCV: 92.8 fL (ref 80.0–100.0)
Monocytes Absolute: 0.7 10*3/uL (ref 0.1–1.0)
Monocytes Relative: 17 %
Neutro Abs: 2.8 10*3/uL (ref 1.7–7.7)
Neutrophils Relative %: 68 %
Platelets: 130 10*3/uL — ABNORMAL LOW (ref 150–400)
RBC: 4.15 MIL/uL — ABNORMAL LOW (ref 4.22–5.81)
RDW: 14.1 % (ref 11.5–15.5)
WBC: 4.1 10*3/uL (ref 4.0–10.5)
nRBC: 0 % (ref 0.0–0.2)

## 2020-11-02 LAB — PROTIME-INR
INR: 1.2 (ref 0.8–1.2)
INR: 1.2 (ref 0.8–1.2)
Prothrombin Time: 15 seconds (ref 11.4–15.2)
Prothrombin Time: 15.4 seconds — ABNORMAL HIGH (ref 11.4–15.2)

## 2020-11-02 LAB — URINALYSIS, ROUTINE W REFLEX MICROSCOPIC
Bilirubin Urine: NEGATIVE
Glucose, UA: NEGATIVE mg/dL
Hgb urine dipstick: NEGATIVE
Ketones, ur: 5 mg/dL — AB
Leukocytes,Ua: NEGATIVE
Nitrite: NEGATIVE
Protein, ur: NEGATIVE mg/dL
Specific Gravity, Urine: 1.019 (ref 1.005–1.030)
pH: 5 (ref 5.0–8.0)

## 2020-11-02 LAB — PROCALCITONIN: Procalcitonin: 0.18 ng/mL

## 2020-11-02 LAB — CBC
HCT: 43.2 % (ref 39.0–52.0)
Hemoglobin: 14.3 g/dL (ref 13.0–17.0)
MCH: 30.2 pg (ref 26.0–34.0)
MCHC: 33.1 g/dL (ref 30.0–36.0)
MCV: 91.3 fL (ref 80.0–100.0)
Platelets: 141 10*3/uL — ABNORMAL LOW (ref 150–400)
RBC: 4.73 MIL/uL (ref 4.22–5.81)
RDW: 14 % (ref 11.5–15.5)
WBC: 7.1 10*3/uL (ref 4.0–10.5)
nRBC: 0 % (ref 0.0–0.2)

## 2020-11-02 LAB — RESP PANEL BY RT-PCR (FLU A&B, COVID) ARPGX2
Influenza A by PCR: NEGATIVE
Influenza B by PCR: NEGATIVE
SARS Coronavirus 2 by RT PCR: NEGATIVE

## 2020-11-02 LAB — CORTISOL-AM, BLOOD: Cortisol - AM: 8.4 ug/dL (ref 6.7–22.6)

## 2020-11-02 LAB — LACTIC ACID, PLASMA
Lactic Acid, Venous: 2.2 mmol/L (ref 0.5–1.9)
Lactic Acid, Venous: 2.1 mmol/L (ref 0.5–1.9)
Lactic Acid, Venous: 0.9 mmol/L (ref 0.5–1.9)

## 2020-11-02 LAB — TROPONIN I (HIGH SENSITIVITY): Troponin I (High Sensitivity): 29 ng/L — ABNORMAL HIGH (ref <18)

## 2020-11-02 LAB — APTT: aPTT: 30 seconds (ref 24–36)

## 2020-11-02 MED ORDER — FINASTERIDE 5 MG PO TABS
5.0000 mg | ORAL_TABLET | Freq: Every day | ORAL | Status: DC
  Administered 2020-11-02 – 2020-11-04 (×3): 5 mg via ORAL
  Filled 2020-11-02 (×3): qty 1

## 2020-11-02 MED ORDER — ESCITALOPRAM OXALATE 20 MG PO TABS
20.0000 mg | ORAL_TABLET | Freq: Every day | ORAL | Status: DC
  Administered 2020-11-02 – 2020-11-04 (×3): 20 mg via ORAL
  Filled 2020-11-02: qty 2
  Filled 2020-11-02 (×2): qty 1

## 2020-11-02 MED ORDER — ONDANSETRON HCL 4 MG PO TABS: 4.0000 mg | ORAL_TABLET | Freq: Four times a day (QID) | ORAL | Status: DC | PRN

## 2020-11-02 MED ORDER — MONTELUKAST SODIUM 10 MG PO TABS
10.0000 mg | ORAL_TABLET | Freq: Every day | ORAL | Status: DC
  Administered 2020-11-02 – 2020-11-04 (×3): 10 mg via ORAL
  Filled 2020-11-02 (×3): qty 1

## 2020-11-02 MED ORDER — MIDODRINE HCL 5 MG PO TABS
2.5000 mg | ORAL_TABLET | Freq: Three times a day (TID) | ORAL | Status: DC
  Administered 2020-11-02 – 2020-11-04 (×6): 2.5 mg via ORAL
  Filled 2020-11-02 (×8): qty 1

## 2020-11-02 MED ORDER — VANCOMYCIN HCL 2000 MG/400ML IV SOLN
2000.0000 mg | Freq: Once | INTRAVENOUS | Status: AC
  Administered 2020-11-02: 2000 mg via INTRAVENOUS
  Filled 2020-11-02: qty 400

## 2020-11-02 MED ORDER — SODIUM CHLORIDE 0.9 % IV SOLN
2.0000 g | Freq: Three times a day (TID) | INTRAVENOUS | Status: DC
  Administered 2020-11-02 – 2020-11-04 (×6): 2 g via INTRAVENOUS
  Filled 2020-11-02 (×7): qty 2

## 2020-11-02 MED ORDER — POLYETHYLENE GLYCOL 3350 17 G PO PACK
17.0000 g | PACK | Freq: Every day | ORAL | Status: DC
  Administered 2020-11-02: 17 g via ORAL
  Filled 2020-11-02: qty 1

## 2020-11-02 MED ORDER — ENOXAPARIN SODIUM 40 MG/0.4ML IJ SOSY
40.0000 mg | PREFILLED_SYRINGE | INTRAMUSCULAR | Status: DC
  Administered 2020-11-02 – 2020-11-03 (×2): 40 mg via SUBCUTANEOUS
  Filled 2020-11-02 (×3): qty 0.4

## 2020-11-02 MED ORDER — METRONIDAZOLE 500 MG/100ML IV SOLN
500.0000 mg | Freq: Once | INTRAVENOUS | Status: AC
  Administered 2020-11-02: 500 mg via INTRAVENOUS
  Filled 2020-11-02: qty 100

## 2020-11-02 MED ORDER — ONDANSETRON HCL 4 MG/2ML IJ SOLN
4.0000 mg | Freq: Four times a day (QID) | INTRAMUSCULAR | Status: DC | PRN
  Administered 2020-11-02 – 2020-11-03 (×3): 4 mg via INTRAVENOUS
  Filled 2020-11-02 (×3): qty 2

## 2020-11-02 MED ORDER — ALFUZOSIN HCL ER 10 MG PO TB24
10.0000 mg | ORAL_TABLET | Freq: Every day | ORAL | Status: DC
  Administered 2020-11-02 – 2020-11-03 (×2): 10 mg via ORAL
  Filled 2020-11-02 (×3): qty 1

## 2020-11-02 MED ORDER — POLYETHYLENE GLYCOL 3350 17 GM/SCOOP PO POWD
17.0000 g | Freq: Every day | ORAL | Status: DC
  Filled 2020-11-02: qty 255

## 2020-11-02 MED ORDER — MELATONIN 3 MG PO TABS
3.0000 mg | ORAL_TABLET | Freq: Every day | ORAL | Status: DC
  Administered 2020-11-02 – 2020-11-03 (×2): 3 mg via ORAL
  Filled 2020-11-02 (×2): qty 1

## 2020-11-02 MED ORDER — PREDNISONE 10 MG PO TABS
10.0000 mg | ORAL_TABLET | Freq: Every day | ORAL | Status: DC
  Administered 2020-11-02 – 2020-11-04 (×3): 10 mg via ORAL
  Filled 2020-11-02 (×3): qty 1

## 2020-11-02 MED ORDER — VANCOMYCIN HCL 1250 MG/250ML IV SOLN
1250.0000 mg | Freq: Two times a day (BID) | INTRAVENOUS | Status: DC
  Administered 2020-11-03 – 2020-11-04 (×2): 1250 mg via INTRAVENOUS
  Filled 2020-11-02 (×2): qty 250

## 2020-11-02 MED ORDER — SODIUM CHLORIDE 0.9 % IV SOLN
12.5000 mg | Freq: Four times a day (QID) | INTRAVENOUS | Status: DC | PRN
  Administered 2020-11-02 – 2020-11-04 (×4): 12.5 mg via INTRAVENOUS
  Filled 2020-11-02 (×4): qty 12.5

## 2020-11-02 MED ORDER — ACETAMINOPHEN 325 MG PO TABS: 650.0000 mg | ORAL_TABLET | Freq: Four times a day (QID) | ORAL | Status: DC | PRN

## 2020-11-02 MED ORDER — METRONIDAZOLE 500 MG/100ML IV SOLN
500.0000 mg | Freq: Two times a day (BID) | INTRAVENOUS | Status: DC
  Administered 2020-11-03 – 2020-11-04 (×3): 500 mg via INTRAVENOUS
  Filled 2020-11-02 (×3): qty 100

## 2020-11-02 MED ORDER — ADULT MULTIVITAMIN W/MINERALS CH
1.0000 | ORAL_TABLET | Freq: Every day | ORAL | Status: DC
  Administered 2020-11-02 – 2020-11-04 (×3): 1 via ORAL
  Filled 2020-11-02 (×3): qty 1

## 2020-11-02 MED ORDER — VANCOMYCIN HCL IN DEXTROSE 1-5 GM/200ML-% IV SOLN: 1000.0000 mg | Freq: Two times a day (BID) | INTRAVENOUS | Status: DC

## 2020-11-02 MED ORDER — ACETAMINOPHEN 650 MG RE SUPP: 650.0000 mg | Freq: Four times a day (QID) | RECTAL | Status: DC | PRN

## 2020-11-02 MED ORDER — LACTATED RINGERS IV SOLN: INTRAVENOUS | Status: DC

## 2020-11-02 NOTE — Assessment & Plan Note (Signed)
-

## 2020-11-02 NOTE — ED Notes (Signed)
Pt resting no complainints at this time

## 2020-11-02 NOTE — Sepsis Progress Note (Signed)
Following for code sepsis 

## 2020-11-02 NOTE — Assessment & Plan Note (Signed)
Continue prednisone, finasteride, alfuzosin

## 2020-11-02 NOTE — ED Notes (Signed)
Patient had pulled off external catheter and all covers. Confused upon first arousing. Oriented x 4 after sitting patient up and talking to him for a bit. External catheter placed back to suction.

## 2020-11-02 NOTE — Assessment & Plan Note (Signed)
Continue fluids and empiric antibiotics Follow-up culture data

## 2020-11-02 NOTE — Assessment & Plan Note (Signed)
Continue midodrine  

## 2020-11-02 NOTE — H&P (Addendum)
History and Physical    Dustin Bumbaugh QZE:[id number] DOB: [birth year] DOA: 11/01/2020  PCP: Javier Glazier, MD  Patient coming from: Home  I have personally briefly reviewed patient's old medical records in [place]  Chief Complaint: AMS, fever  HPI: [place] is a 70 y.o. 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  Pt got flu shot yesterday.  Today pt presents to ED with confusion, nausea, dry heaves, no BMs, fever 102 at home.  No: CP, SOB, cough, dysuria, abd pain, bed sores or other wounds,    ED Course: Confusion resolved.  Initially hypotensive though this resolves with IVF + resuming midodrine (which he missed today at home), now has SBPs in the 130s.  WBC nl, no SIRS in ED.  No source of infection identified at this point.   Review of Systems: As per HPI, otherwise all review of systems negative.  Past Medical History:  Diagnosis Date   BPH (benign prostatic hyperplasia)    Goals of care, counseling/discussion 05/31/2020   Heart murmur    HLD (hyperlipidemia)    Hypothyroidism    Renal cell carcinoma (HCC)     Past Surgical History:  Procedure Laterality Date   FEMUR SURGERY     KNEE SURGERY     SPINE SURGERY     TONSILECTOMY/ADENOIDECTOMY WITH MYRINGOTOMY       reports that he quit smoking about 23 years ago. His smoking use included cigarettes. He has a 12.50 pack-year smoking history. He has never used smokeless tobacco. He reports that he does not currently use alcohol. He reports that he does not use drugs.  Allergies  Allergen Reactions   Morphine Nausea Only    Family History  Problem Relation Age of Onset   Cancer Father    Cancer Maternal Grandmother    Cancer Paternal Grandfather      Prior to Admission medications   Medication Sig Start Date End  Date Taking? Authorizing Provider  Acetaminophen 500 MG capsule Take 500 mg by mouth every 8 (eight) hours as needed. 05/13/20   [provider]  alfuzosin (UROXATRAL) 10 MG 24 hr tablet Take 10 mg by mouth at bedtime. 04/08/20   [provider]  ascorbic acid (VITAMIN C) 500 MG tablet Take 500 mg by mouth in the morning. 06/08/10   [provider]  Cholecalciferol 25 MCG (1000 UT) tablet Take 25 Units by mouth daily.    [provider]  COVID-19 mRNA bivalent vaccine, Pfizer, injection Inject into the muscle. 10/19/20   Carlyle Basques, MD  escitalopram (LEXAPRO) 20 MG tablet Take 1 tablet by mouth daily. 05/17/20   [provider]  finasteride (PROSCAR) 5 MG tablet Take 5 mg by mouth daily. 04/08/20   [provider]  guaiFENesin (MUCINEX) 600 MG 12 hr tablet Take 600 mg by mouth in the morning and at bedtime. 06/12/11   [provider]  Hypertonic Nasal Wash (SINUS RINSE) PACK Irrigate with as directed.    [provider]  LORazepam (ATIVAN) 1 MG tablet Take 1 tablet (1 mg total) by mouth every 6 (six) hours as needed. Patient not taking: Reported on 09/29/2020 09/02/20   Volanda Napoleon, MD  MAGNESIUM CARBONATE PO Take 325 mg by mouth daily.    [provider]  melatonin 3 MG TABS tablet Take  3 mg by mouth at bedtime.    [provider]  midodrine (PROAMATINE) 2.5 MG tablet Take 1 tablet (2.5 mg total) by mouth 3 (three) times daily. 08/04/20   Volanda Napoleon, MD  montelukast (SINGULAIR) 10 MG tablet Take 1 tablet by mouth daily. 03/14/20   [provider]  Multiple Vitamin (MULTI-VITAMINS) TABS Take 1 tablet by mouth daily.    [provider]  nivolumab (OPDIVO) 100 MG/10ML SOLN chemo injection Inject into the vein.    [provider]  Omega-3 Fatty Acids (FISH OIL PO) Take 5 mLs by mouth daily.    [provider]  ondansetron (ZOFRAN-ODT) 4 MG disintegrating tablet Take 4 mg by  mouth every 8 (eight) hours as needed. Patient not taking: Reported on 09/29/2020 05/13/20   [provider]  polyethylene glycol powder (GLYCOLAX/MIRALAX) 17 GM/SCOOP powder Take 17 g by mouth at bedtime.    [provider]  Potassium Citrate-Citric Acid 3300-1002 MG PACK Take by mouth daily.    [provider]  predniSONE (DELTASONE) 10 MG tablet Take 1 tablet (10 mg total) by mouth daily. 10/26/20   Volanda Napoleon, MD  prochlorperazine (COMPAZINE) 10 MG tablet Take 10 mg by mouth every 6 (six) hours as needed. Patient not taking: Reported on 09/29/2020 05/11/20   [provider]  propranolol (INDERAL) 10 MG tablet Take 10 mg by mouth daily as needed. 12/15/19   [provider]  senna-docusate (SENOKOT-S) 8.6-50 MG tablet Take by mouth. Patient not taking: Reported on 09/29/2020 05/13/20   [provider]  vitamin E 180 MG (400 UNITS) capsule Take 400 Units by mouth every morning. 06/12/11   [provider]    Physical Exam: Vitals:   11/02/20 0130 11/02/20 0145 11/02/20 0215 11/02/20 0245  BP: (!) 81/57 (!) 89/62 (!) 96/58 (!) 107/59  Pulse: 70 74 72 77  Resp: (!) 21 18 (!) 21 15  Temp:      TempSrc:      SpO2: 97% 99% 97% 95%  Weight:      Height:        Constitutional: NAD, calm, comfortable Eyes: PERRL, lids and conjunctivae normal ENMT: Mucous membranes are moist. Posterior pharynx clear of any exudate or lesions.Normal dentition.  Neck: normal, supple, no masses, no thyromegaly Respiratory: clear to auscultation bilaterally, no wheezing, no crackles. Normal respiratory effort. No accessory muscle use.  Cardiovascular: Regular rate and rhythm, no murmurs / rubs / gallops. No extremity edema. 2+ pedal pulses. No carotid bruits.  Abdomen: no tenderness, no masses palpated. No hepatosplenomegaly. Bowel sounds positive.  Musculoskeletal: no clubbing / cyanosis. No joint deformity upper and lower extremities. Good ROM, no  contractures. Normal muscle tone.  Skin: no rashes, lesions, ulcers. No induration Neurologic: CN 2-12 grossly intact. Sensation intact, DTR normal. Strength 5/5 in all 4.  Psychiatric: Normal judgment and insight. Alert and oriented x 3. Normal mood.    Labs on Admission: I have personally reviewed following labs and imaging studies  CBC: Recent Labs  Lab 10/26/20 1303 11/01/20 2315  WBC 6.5 4.1  NEUTROABS 5.0 2.8  HGB 11.8* 12.7*  HCT 36.0* 38.5*  MCV 93.3 92.8  PLT 179 222*   Basic Metabolic Panel: Recent Labs  Lab 10/26/20 1303 11/01/20 2315  NA 140 132*  K 4.5 3.0*  CL 107 101  CO2 27 23  GLUCOSE 86 120*  BUN 29* 18  CREATININE 0.89 0.95  CALCIUM 10.1 9.6   GFR: Estimated Creatinine  Clearance: 91.9 mL/min (by C-G formula based on SCr of 0.95 mg/dL). Liver Function Tests: Recent Labs  Lab 10/26/20 1303 11/01/20 2315  AST 11* 18  ALT 18 18  ALKPHOS 65 73  BILITOT 0.5 1.0  PROT 6.2* 6.1*  ALBUMIN 3.9 3.6   No results for input(s): LIPASE, AMYLASE in the last 168 hours. No results for input(s): AMMONIA in the last 168 hours. Coagulation Profile: Recent Labs  Lab 11/01/20 2315  INR 1.2   Cardiac Enzymes: No results for input(s): CKTOTAL, CKMB, CKMBINDEX, TROPONINI in the last 168 hours. BNP (last 3 results) No results for input(s): PROBNP in the last 8760 hours. HbA1C: No results for input(s): HGBA1C in the last 72 hours. CBG: No results for input(s): GLUCAP in the last 168 hours. Lipid Profile: No results for input(s): CHOL, HDL, LDLCALC, TRIG, CHOLHDL, LDLDIRECT in the last 72 hours. Thyroid Function Tests: No results for input(s): TSH, T4TOTAL, FREET4, T3FREE, THYROIDAB in the last 72 hours. Anemia Panel: No results for input(s): VITAMINB12, FOLATE, FERRITIN, TIBC, IRON, RETICCTPCT in the last 72 hours. Urine analysis:    Component Value Date/Time   COLORURINE YELLOW 11/02/2020 0100   APPEARANCEUR CLEAR 11/02/2020 0100   LABSPEC 1.019  11/02/2020 0100   PHURINE 5.0 11/02/2020 0100   GLUCOSEU NEGATIVE 11/02/2020 0100   HGBUR NEGATIVE 11/02/2020 0100   BILIRUBINUR NEGATIVE 11/02/2020 0100   KETONESUR 5 (A) 11/02/2020 0100   PROTEINUR NEGATIVE 11/02/2020 0100   NITRITE NEGATIVE 11/02/2020 0100   LEUKOCYTESUR NEGATIVE 11/02/2020 0100    Radiological Exams on Admission: CT ABDOMEN PELVIS WO CONTRAST  Result Date: 11/02/2020 CLINICAL DATA:  Diffuse abdominal pain, history of renal carcinoma EXAM: CT ABDOMEN AND PELVIS WITHOUT CONTRAST TECHNIQUE: Multidetector CT imaging of the abdomen and pelvis was performed following the standard protocol without IV contrast. COMPARISON:  09/02/2020 PET-CT FINDINGS: Lower chest: Mild dependent atelectatic changes are noted. No focal effusion or infiltrate is seen. Hepatobiliary: No focal liver abnormality is seen. No gallstones, gallbladder wall thickening, or biliary dilatation. Pancreas: Unremarkable. No pancreatic ductal dilatation or surrounding inflammatory changes. Spleen: Normal in size without focal abnormality. Adrenals/Urinary Tract: Adrenal glands are within normal limits. Kidneys demonstrate no renal calculi or obstructive changes. Ureters are within normal limits. Bladder is well distended. Stomach/Bowel: Fecal material is noted throughout the colon without obstructive change consistent with mild constipation. The appendix is not well visualized. No inflammatory changes to suggest appendicitis are noted. Small bowel and stomach are within normal limits. Vascular/Lymphatic: Aortic atherosclerosis. No enlarged abdominal or pelvic lymph nodes. Reproductive: Prostate is unremarkable. Other: No abdominal wall hernia or abnormality. No abdominopelvic ascites. Musculoskeletal: Postsurgical changes in the proximal right femur are noted. Degenerative changes of the lumbar spine are seen. No acute metastatic disease is seen. Sclerotic focus in the left iliac bone is again noted and stable. IMPRESSION:  Changes consistent with mild colonic constipation. No obstructive changes are seen. No other focal abnormality is noted. Electronically Signed   By: Inez Catalina M.D.   On: 11/02/2020 00:03   DG Chest 1 View  Result Date: 11/02/2020 CLINICAL DATA:  Known history of metastatic renal cell carcinoma with possible sepsis, initial encounter EXAM: PORTABLE CHEST 1 VIEW COMPARISON:  None. FINDINGS: Cardiac shadow is at the upper limits of normal in size. Postsurgical changes in the thoracic spine are noted consistent with the given clinical history. No sizable infiltrate or effusion is noted. No acute bony abnormality is seen. IMPRESSION: Postsurgical changes. No acute abnormality noted. Electronically Signed  By: Inez Catalina M.D.   On: 11/02/2020 00:12   CT HEAD WO CONTRAST (5MM)  Result Date: 11/02/2020 CLINICAL DATA:  Altered mental status, history of metastatic renal cell carcinoma EXAM: CT HEAD WITHOUT CONTRAST TECHNIQUE: Contiguous axial images were obtained from the base of the skull through the vertex without intravenous contrast. COMPARISON:  None. FINDINGS: Brain: No evidence of acute infarction, hemorrhage, hydrocephalus, extra-axial collection or mass lesion/mass effect. Vascular: No hyperdense vessel or unexpected calcification. Skull: Normal. Negative for fracture or focal lesion. Sinuses/Orbits: No acute finding. Other: None. IMPRESSION: No acute intracranial abnormality noted. Electronically Signed   By: Inez Catalina M.D.   On: 11/02/2020 00:04    EKG: Independently reviewed.  Assessment/Plan Principal Problem:   Acute encephalopathy Active Problems:   Arterial hypotension   Metastatic renal cell carcinoma (HCC)   Fever   Current chronic use of systemic steroids    Acute encephalopathy, fever - Likely was delirium due to fever Unclear source of fever ?Hyper immune response to Flu vaccine?  Can Opdivo cause this?  Sounds like it based on the [employer] article on Opdivo. No symptoms  other than the encephalopathy earlier and nausea. No headache nor meningismus Mental status improved at this point Neg tests for source thus far: CT A/P CXR UA COVID and Flu Still pending tests - BCx UCx Procalcitonin Serial lactates Repeat CBC in AM Cortisol MRSA PCR nares Got 2g rocephin in ED Not neutropenic, not septic at this point. Going to hold off on further ABx for the moment unless he worsens clinically pending results.  Though low threshold for empiric broad spectrum ABx. In immunosuppressed patient. Chronic hypotension - Hypotensive initially today because he didn't take his midodrine Hypotension resolved after 2L IVF bolus plus PO midodrine Cont home midodrine Chronic steroid use - Cont prednisone BP came up with IVF + midodrine so will hold off on stress dose steroids for now. Metastatic RCC - IP consult to oncology put into epic for AM  DVT prophylaxis: Lovenox Code Status: Full Family Communication: No family in room Disposition Plan: Home after fever workup Consults called: IP consult to oncology put into epic Admission status: Place in 42     Nari Vannatter, [place]  How to contact the [facility] Attending or Consulting provider [place] or covering provider during after hours [place], for this patient?  Check the care team in [facility] and look for a) attending/consulting [facility] provider listed and b) the [facility] team listed Log into [email or web address]  Amion Physician Scheduling and messaging for groups and whole hospitals  On call and physician scheduling software for group practices, residents, hospitalists and other medical providers for call, clinic, rotation and shift schedules. On[employer] is a hospital-wide system for scheduling doctors and paging doctors on call. EasyPlot is for scientific plotting and data analysis.  [email or web address]  and use [place]'s universal password to access. If you do not have the password, please contact the hospital  operator.  Locate the [facility] provider you are looking for under [facility] and page to a number that you can be directly reached. If you still have difficulty reaching the provider, please page the [facility] (Director on Call) for the Hospitalists listed on amion for assistance.  11/02/2020, 2:58 AM

## 2020-11-02 NOTE — ED Notes (Signed)
C/O nausea. No active vomiting. No complaints of pain. Patient drowsy and resting while I am in room to medicate.

## 2020-11-02 NOTE — Assessment & Plan Note (Signed)
Continue fluids and antibiotics Consult oncology regarding encephalopathy from nivolumab

## 2020-11-02 NOTE — ED Notes (Signed)
Spouse at bedside. Provider called to speak with her about plan of care. Patient alert, oriented x 4 talking to spouse. No complaints of pain. VSS

## 2020-11-02 NOTE — Hospital Course (Addendum)
Mr. Zavadil is a 70 y.o M with metastatic RCC on nivolumab, mets to bones, in remission, also chronic hypotension on midodrine who presented with confusion, dry heaves and fever.    Had gotten the flu shot on day of admission.  That night, develped malaise, heaves, fever, and confusion.  In the ER, confusion waxing and waning (not apparent to admitting MD, very confused for me a few hours later.   CXR and UA normal.    10/5 Started on empiric antibiotics and admitted.  10/6 No fever, mentation back to normal, appears asymptomatic

## 2020-11-02 NOTE — Progress Notes (Signed)
  Progress Note    Francis Cunningham   [id number]:[id number]  DOB: [birth year]  DOA: 11/01/2020     0 Date of Service: 11/02/2020       Hospital Problems * Acute encephalopathy Continue fluids and antibiotics Consult oncology regarding encephalopathy from nivolumab  SIRS (systemic inflammatory response syndrome) (HCC) Continue fluids and empiric antibiotics Follow-up culture data  Arterial hypotension Continue midodrine  Metastatic renal cell carcinoma (HCC) Continue prednisone, finasteride, alfuzosin  Current chronic use of systemic steroids    Depression with anxiety Continue citalopram     Objective Vital signs were reviewed and remarkable for improving blood pressure, normalizing heart rate SPO2 normal     Labs / Other Information My review of labs, imaging, notes and other tests is significant for Elevated lactic acid, white blood cell count 4.1, hemoglobin normal, sodium 132, potassium low  This is a no charge note, wife discussed with the bedside.  For further details please see Dr. Juleen China note from earlier today   Time spent: 20 minutes [facility] 11/02/2020, 6:26 PM

## 2020-11-02 NOTE — Progress Notes (Addendum)
Pharmacy Antibiotic Note  Francis Cunningham is a 70 y.o. male admitted on 11/01/2020 with sepsis.  Pharmacy has been consulted for vancomycin and cefepime dosing.  Plan: Cefepime 2g IV q8h, Flagyl 500mg  q12h Vancomycin 2g IV x 1 then 1250g IV q12h for estimated AUC 450 using SCr 0.95, goal AUC 400-550 Follow up renal function & cultures   Height: 6\' 3"  (190.5 cm) Weight: 104.3 kg (230 lb) IBW/kg (Calculated) : 84.5  Temp (24hrs), Avg:98.7 F (37.1 C), Min:98.2 F (36.8 C), Max:99.2 F (37.3 C)  Recent Labs  Lab 11/01/20 2315 11/02/20 0115 11/02/20 1428  WBC 4.1  --  7.1  CREATININE 0.95  --   --   LATICACIDVEN 2.2* 2.1*  --     Estimated Creatinine Clearance: 91.9 mL/min (by C-G formula based on SCr of 0.95 mg/dL).    Allergies  Allergen Reactions   Morphine Nausea Only    Antimicrobials this admission:  10/5 CTX x 1 10/5 Cefepime >> 10/5 Flagyl >> 10/5 Vancomycin >>  Dose adjustments this admission:    Microbiology results:  10/5 BCx: 10/5 UCx: 10/5 MRSA PCR:  Thank you for allowing pharmacy to be a part of this patient's care.  Peggyann Juba, PharmD, BCPS Pharmacy: [phone] 11/02/2020 3:13 PM

## 2020-11-02 NOTE — ED Notes (Signed)
Resting. Arousable with voice. Oriented x 4, but with slow response times to normal conversation and some stuttering noted when trying to explain things. Incontinent of urine. Bed linen changed. External catheter placed to wall suction. No difficulty swallowing. Able to take PO medications without difficulty. VSS. Call bell in reach. Bed locked in lowest position. Denies pain or discomfort.

## 2020-11-03 DIAGNOSIS — F418 Other specified anxiety disorders: Secondary | ICD-10-CM

## 2020-11-03 LAB — CBC
HCT: 38.9 % — ABNORMAL LOW (ref 39.0–52.0)
Hemoglobin: 13 g/dL (ref 13.0–17.0)
MCH: 30.2 pg (ref 26.0–34.0)
MCHC: 33.4 g/dL (ref 30.0–36.0)
MCV: 90.3 fL (ref 80.0–100.0)
Platelets: 124 10*3/uL — ABNORMAL LOW (ref 150–400)
RBC: 4.31 MIL/uL (ref 4.22–5.81)
RDW: 13.7 % (ref 11.5–15.5)
WBC: 4.7 10*3/uL (ref 4.0–10.5)
nRBC: 0 % (ref 0.0–0.2)

## 2020-11-03 LAB — BASIC METABOLIC PANEL
Anion gap: 6 (ref 5–15)
BUN: 12 mg/dL (ref 8–23)
CO2: 22 mmol/L (ref 22–32)
Calcium: 9 mg/dL (ref 8.9–10.3)
Chloride: 102 mmol/L (ref 98–111)
Creatinine, Ser: 0.7 mg/dL (ref 0.61–1.24)
GFR, Estimated: >60 mL/min (ref >60)
Glucose, Bld: 116 mg/dL — ABNORMAL HIGH (ref 70–99)
Potassium: 4 mmol/L (ref 3.5–5.1)
Sodium: 130 mmol/L — ABNORMAL LOW (ref 135–145)

## 2020-11-03 LAB — URINE CULTURE: Culture: <10000 — AB

## 2020-11-03 MED ORDER — SODIUM CHLORIDE 0.9 % IV SOLN
INTRAVENOUS | Status: DC | PRN
  Administered 2020-11-03: 250 mL via INTRAVENOUS

## 2020-11-03 NOTE — Assessment & Plan Note (Signed)
Resolved

## 2020-11-03 NOTE — Assessment & Plan Note (Signed)
-   Continue empiric antibiotics - Follow-up culture data

## 2020-11-03 NOTE — Assessment & Plan Note (Signed)
-   Continue prednisone, finasteride, alfuzosin

## 2020-11-03 NOTE — Assessment & Plan Note (Signed)
-   Continue citalopram 

## 2020-11-03 NOTE — Assessment & Plan Note (Signed)
Continue midodrine  

## 2020-11-03 NOTE — Assessment & Plan Note (Signed)
Presented with SIRS and confusion.  Unclear source of infection.  CxR and urine clear.  Symptoms have resolved completely, and he is asymptomatic today.    - Continue empiric antibiotics - Follow blood cultures, if negative at 48 hours, I would favor discontinuing antibiotics and discharge home

## 2020-11-03 NOTE — Progress Notes (Signed)
Progress Note    Francis Cunningham   [id number]:[id number]  DOB: 02-06-1950  DOA: 11/01/2020     1 Date of Service: 11/03/2020    Brief summary:  Francis Cunningham is a 70 y.o. 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   Pt got flu shot yesterday.   Today pt presents to ED with confusion, nausea, dry heaves, no BMs, fever 102 at home.   No: CP, SOB, cough, dysuria, abd pain, bed sores or other wounds,      ED Course: Confusion resolved.   Initially hypotensive though this resolves with IVF + resuming midodrine (which he missed today at home), now has SBPs in the 130s.   WBC nl, no SIRS in ED.   No source of infection identified at this point.     Subjective:  Feeling better.  No confusion. No cough, sputum.  No chest pain.  No abdominal pain.  No urinary irritative symptoms, no flank pain, no vomiting.  no neck pain. no headache.  Hospital Problems * Acute encephalopathy Resolved  Sepsis ([place]) Presented with SIRS and confusion.  Unclear source of infection.  CxR and urine clear.  Symptoms have resolved completely, and he is asymptomatic today.    - Continue empiric antibiotics - Follow blood cultures, if negative at 48 hours, I would favor discontinuing antibiotics and discharge home  SIRS (systemic inflammatory response syndrome) (HCC) - Continue empiric antibiotics - Follow-up culture data  Arterial hypotension - Continue midodrine  Metastatic renal cell carcinoma (HCC) - Continue prednisone, finasteride, alfuzosin  Current chronic use of systemic steroids     Depression with anxiety - Continue citalopram     Objective Vital signs were reviewed and unremarkable.  Vitals:   11/02/20 2306 11/03/20 0545 11/03/20 1447 11/03/20 1915  BP: 103/66 104/67 97/62 104/69  Pulse: 76 73 74 76  Resp: 16  18 20 20   Temp: 98 F (36.7 C) 98.1 F (36.7 C) 98.4 F (36.9 C) 98 F (36.7 C)  TempSrc: Oral Oral Oral Oral  SpO2: 95% 96% 94% 97%  Weight:      Height:       104.3 kg  Exam Physical Exam Constitutional:      General: He is not in acute distress. HENT:     Nose: No nasal deformity or rhinorrhea.     Mouth/Throat:     Lips: Pink. No lesions.     Mouth: Mucous membranes are moist. No oral lesions.     Dentition: Normal dentition.     Pharynx: Oropharynx is clear. No posterior oropharyngeal erythema.  Eyes:     General: Lids are normal. Gaze aligned appropriately.     Extraocular Movements: Extraocular movements intact.     Conjunctiva/sclera: Conjunctivae normal.  Cardiovascular:     Rate and Rhythm: Normal rate and regular rhythm.     Pulses:          Radial pulses are 2+ on the right side and 2+ on the left side.     Heart sounds: Normal heart sounds, S1 normal and S2 normal. No murmur heard. Pulmonary:     Effort: Pulmonary effort is normal. No respiratory distress.     Breath sounds: No wheezing or rales.  Abdominal:     General: There is no distension.     Palpations: Abdomen is soft.  Tenderness: There is no abdominal tenderness. There is no guarding or rebound.  Musculoskeletal:     Right lower leg: No edema.     Left lower leg: No edema.  Skin:    General: Skin is warm and dry.     Findings: No lesion or rash.  Neurological:     Mental Status: He is alert and oriented to person, place, and time.     Cranial Nerves: Cranial nerves are intact.     Motor: No weakness.  Psychiatric:        Attention and Perception: Attention normal.        Mood and Affect: Mood and affect normal.        Behavior: Behavior is cooperative.        Cognition and Memory: Memory normal.        Judgment: Judgment normal.       Labs / Other Information My review of labs, imaging, notes and other tests shows no new significant findings.     Time spent: 25 miuntues [facility] 11/03/2020, 9:54 PM

## 2020-11-04 LAB — CBC
HCT: 36.7 % — ABNORMAL LOW (ref 39.0–52.0)
Hemoglobin: 12.1 g/dL — ABNORMAL LOW (ref 13.0–17.0)
MCH: 30.2 pg (ref 26.0–34.0)
MCHC: 33 g/dL (ref 30.0–36.0)
MCV: 91.5 fL (ref 80.0–100.0)
Platelets: 122 10*3/uL — ABNORMAL LOW (ref 150–400)
RBC: 4.01 MIL/uL — ABNORMAL LOW (ref 4.22–5.81)
RDW: 14.1 % (ref 11.5–15.5)
WBC: 3.4 10*3/uL — ABNORMAL LOW (ref 4.0–10.5)
nRBC: 0 % (ref 0.0–0.2)

## 2020-11-04 LAB — BASIC METABOLIC PANEL
Anion gap: 2 — ABNORMAL LOW (ref 5–15)
BUN: 13 mg/dL (ref 8–23)
CO2: 23 mmol/L (ref 22–32)
Calcium: 9.5 mg/dL (ref 8.9–10.3)
Chloride: 114 mmol/L — ABNORMAL HIGH (ref 98–111)
Creatinine, Ser: 0.75 mg/dL (ref 0.61–1.24)
GFR, Estimated: >60 mL/min (ref >60)
Glucose, Bld: 107 mg/dL — ABNORMAL HIGH (ref 70–99)
Potassium: 3.8 mmol/L (ref 3.5–5.1)
Sodium: 139 mmol/L (ref 135–145)

## 2020-11-04 LAB — PROCALCITONIN: Procalcitonin: 0.26 ng/mL

## 2020-11-04 MED ORDER — ONDANSETRON 4 MG PO TBDP: 4.0000 mg | ORAL_TABLET | Freq: Three times a day (TID) | ORAL | 0 refills | Status: DC | PRN

## 2020-11-04 MED ORDER — PROMETHAZINE HCL 12.5 MG PO TABS: 12.5000 mg | ORAL_TABLET | Freq: Four times a day (QID) | ORAL | 0 refills | Status: DC | PRN

## 2020-11-04 NOTE — Progress Notes (Signed)
AVS reviewed with pt and pt's wife. IV and tele d/c. Patient belongings returned

## 2020-11-04 NOTE — Discharge Summary (Signed)
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  On presentation, confusion resolved.  Patient was initially hypotensive which improved with IV fluids and resumption of midodrine.  WBCs were normal.  No source of infection was identified.  Patient was empirically started on broad-spectrum antibiotics.  During the hospitalization, condition has much improved.  He is currently afebrile with no evidence of infection and negative blood cultures.  He will be discharged home today off of antibiotics.  Discharge Diagnoses:   Acute metabolic encephalopathy -Possibly from fever.  Resolved.  Mental status back to baseline.  Fever/SIRS -No clear-cut evidence of infection.  Chest x-ray and UA negative. -Sepsis has been ruled out.  Blood cultures negative so far.  Currently on empiric antibiotics.  DC antibiotics and discharge patient home today.  Chronic hypotension Chronic use of systemic steroids -Blood pressure stable currently.  Continue prednisone and midodrine  Metastatic renal cell carcinoma -Outpatient follow-up with oncology/urology.  Continue home regimen  Depression with anxiety -Continue citalopram    Discharge Instructions  Discharge Instructions      Diet general   Complete by: As directed    Increase activity slowly   Complete by: As directed       Allergies as of 11/04/2020       Reactions   Morphine Nausea Only        Medication List     STOP taking these medications    LORazepam 1 MG tablet Commonly known as: ATIVAN   MAGNESIUM CARBONATE PO   ondansetron 4 MG disintegrating tablet Commonly known as: ZOFRAN-ODT   Pfizer COVID-19 Vac Bivalent injection Generic drug: COVID-19 mRNA bivalent vaccine Therapist, music)   prochlorperazine 10 MG tablet Commonly known as: COMPAZINE   propranolol 10 MG tablet Commonly known as: INDERAL   senna-docusate 8.6-50 MG tablet Commonly known as: Senokot-S       TAKE these medications    Acetaminophen 500 MG capsule Take 500 mg by mouth every 8 (eight) hours as needed for pain.   alfuzosin 10 MG 24 hr tablet Commonly known as: UROXATRAL Take 10 mg by mouth at bedtime.   ascorbic acid 500 MG tablet Commonly known as: VITAMIN C Take 500 mg by mouth in the morning.   Cholecalciferol 25 MCG (1000 UT) tablet Take 25 Units by mouth daily.   escitalopram 20 MG tablet Commonly known as: LEXAPRO Take 20 mg by mouth daily.   finasteride 5 MG tablet Commonly known as: PROSCAR Take 5 mg by mouth daily.   FISH OIL PO Take 5 mLs by mouth daily.   guaiFENesin 600 MG 12 hr tablet Commonly known as: MUCINEX Take 600 mg by mouth in the morning and at bedtime.   melatonin 3 MG Tabs tablet Take 3 mg by mouth at bedtime.   midodrine 2.5 MG tablet Commonly known as: PROAMATINE Take 1 tablet (2.5 mg total) by mouth  3 (three) times daily.   montelukast 10 MG tablet Commonly known as: SINGULAIR Take 1 tablet by mouth daily.   Multi-Vitamins Tabs Take 1 tablet by mouth daily.   nivolumab 100 MG/10ML Soln chemo injection Commonly known as: OPDIVO Inject into the vein.   polyethylene glycol powder 17 GM/SCOOP powder Commonly known as: GLYCOLAX/MIRALAX Take 17 g by mouth at  bedtime.   Potassium Citrate-Citric Acid 3300-1002 MG Pack Take by mouth daily.   predniSONE 10 MG tablet Commonly known as: DELTASONE Take 1 tablet (10 mg total) by mouth daily.   promethazine 12.5 MG tablet Commonly known as: PHENERGAN Take 1 tablet (12.5 mg total) by mouth every 6 (six) hours as needed for nausea or vomiting.   Sinus Rinse Pack Irrigate with as directed.   vitamin E 180 MG (400 UNITS) capsule Take 400 Units by mouth every morning.        Follow-up Information     Javier Glazier, MD. Schedule an appointment as soon as possible for a visit in 1 week(s).   Specialty: Internal Medicine Why: with repeat cbc/bmp Contact information: [address] [place] [address] [phone]                Allergies  Allergen Reactions   Morphine Nausea Only    Consultations: None   Procedures/Studies: CT ABDOMEN PELVIS WO CONTRAST  Result Date: 11/02/2020 CLINICAL DATA:  Diffuse abdominal pain, history of renal carcinoma EXAM: CT ABDOMEN AND PELVIS WITHOUT CONTRAST TECHNIQUE: Multidetector CT imaging of the abdomen and pelvis was performed following the standard protocol without IV contrast. COMPARISON:  09/02/2020 PET-CT FINDINGS: Lower chest: Mild dependent atelectatic changes are noted. No focal effusion or infiltrate is seen. Hepatobiliary: No focal liver abnormality is seen. No gallstones, gallbladder wall thickening, or biliary dilatation. Pancreas: Unremarkable. No pancreatic ductal dilatation or surrounding inflammatory changes. Spleen: Normal in size without focal abnormality. Adrenals/Urinary Tract: Adrenal glands are within normal limits. Kidneys demonstrate no renal calculi or obstructive changes. Ureters are within normal limits. Bladder is well distended. Stomach/Bowel: Fecal material is noted throughout the colon without obstructive change consistent with mild constipation. The appendix is not well visualized. No inflammatory changes to  suggest appendicitis are noted. Small bowel and stomach are within normal limits. Vascular/Lymphatic: Aortic atherosclerosis. No enlarged abdominal or pelvic lymph nodes. Reproductive: Prostate is unremarkable. Other: No abdominal wall hernia or abnormality. No abdominopelvic ascites. Musculoskeletal: Postsurgical changes in the proximal right femur are noted. Degenerative changes of the lumbar spine are seen. No acute metastatic disease is seen. Sclerotic focus in the left iliac bone is again noted and stable. IMPRESSION: Changes consistent with mild colonic constipation. No obstructive changes are seen. No other focal abnormality is noted. Electronically Signed   By: Inez Catalina M.D.   On: 11/02/2020 00:03   DG Chest 1 View  Result Date: 11/02/2020 CLINICAL DATA:  Known history of metastatic renal cell carcinoma with possible sepsis, initial encounter EXAM: PORTABLE CHEST 1 VIEW COMPARISON:  None. FINDINGS: Cardiac shadow is at the upper limits of normal in size. Postsurgical changes in the thoracic spine are noted consistent with the given clinical history. No sizable infiltrate or effusion is noted. No acute bony abnormality is seen. IMPRESSION: Postsurgical changes. No acute abnormality noted. Electronically Signed   By: Inez Catalina M.D.   On: 11/02/2020 00:12   CT HEAD WO CONTRAST (5MM)  Result Date: 11/02/2020 CLINICAL DATA:  Altered mental status, history of metastatic renal cell carcinoma EXAM: CT  HEAD WITHOUT CONTRAST TECHNIQUE: Contiguous axial images were obtained from the base of the skull through the vertex without intravenous contrast. COMPARISON:  None. FINDINGS: Brain: No evidence of acute infarction, hemorrhage, hydrocephalus, extra-axial collection or mass lesion/mass effect. Vascular: No hyperdense vessel or unexpected calcification. Skull: Normal. Negative for fracture or focal lesion. Sinuses/Orbits: No acute finding. Other: None. IMPRESSION: No acute intracranial abnormality noted.  Electronically Signed   By: Inez Catalina M.D.   On: 11/02/2020 00:04      Subjective: Patient seen and examined at bedside.  Denies fever, vomiting, worsening shortness of breath.  Feels okay to go home today.  Discharge Exam: Vitals:   11/04/20 0410 11/04/20 1136  BP: 108/71 108/74  Pulse: 71 82  Resp: 14 16  Temp: 98.2 F (36.8 C) (!) 97.4 F (36.3 C)  SpO2: 95% 100%    General: Pt is alert, awake, not in acute distress.  Currently on room air. Cardiovascular: rate controlled, S1/S2 + Respiratory: bilateral decreased breath sounds at bases Abdominal: Soft, NT, ND, bowel sounds + Extremities: no edema, no cyanosis    The results of significant diagnostics from this hospitalization (including imaging, microbiology, ancillary and laboratory) are listed below for reference.     Microbiology: Recent Results (from the past 240 hour(s))  Blood Culture (routine x 2)     Status: None (Preliminary result)   Collection Time: 11/01/20 11:15 PM   Specimen: BLOOD RIGHT HAND  Result Value Ref Range Status   Specimen Description   Final    BLOOD RIGHT HAND Performed at [facility], [place] [address] [place], [place] [address]    Special Requests   Final    BOTTLES DRAWN AEROBIC AND ANAEROBIC Blood Culture adequate volume Performed at [facility] [address] [place], [place] [address]    Culture   Final    NO GROWTH 2 DAYS Performed at [facility] [address], [place], [place] [address]    Report Status PENDING  Incomplete  Blood Culture (routine x 2)     Status: None (Preliminary result)   Collection Time: 11/01/20 11:20 PM   Specimen: BLOOD LEFT HAND  Result Value Ref Range Status   Specimen Description   Final    BLOOD LEFT HAND Performed at [facility], [place] [address] [place], [place] [address]    Special Requests   Final    BOTTLES DRAWN AEROBIC AND ANAEROBIC Blood Culture adequate volume Performed at [facility] [address] [place], [place] [address]    Culture   Final    NO GROWTH 2 DAYS Performed at [facility] [address], [place], [place] [address]    Report Status PENDING  Incomplete  Resp Panel by RT-PCR (Flu A&B, Covid) Nasopharyngeal Swab     Status: None   Collection Time: 11/02/20  1:00 AM   Specimen: Nasopharyngeal Swab; Nasopharyngeal(NP) swabs in vial transport medium  Result Value Ref Range Status   SARS Coronavirus 2 by RT PCR NEGATIVE NEGATIVE Final    Comment: (NOTE) SARS-CoV-2 target nucleic acids are NOT DETECTED.  The SARS-CoV-2 RNA is generally detectable in upper respiratory specimens during the acute phase of infection. The lowest concentration of SARS-CoV-2 viral copies this assay can detect is 138 copies/mL. A negative result does not preclude SARS-Cov-2 infection and should not be used as the sole basis for treatment or other patient management decisions. A negative result may occur with  improper specimen collection/handling, submission of specimen other than nasopharyngeal swab, presence of viral mutation(s) within the areas targeted by this assay, and inadequate number of viral copies(<138 copies/mL). A negative result must be combined with clinical observations, patient history, and epidemiological information. The expected result is Negative.  Fact Sheet for Patients:  EntrepreneurPulse.com.au  Fact Sheet for Healthcare Providers:  IncredibleEmployment.be  This test is no t yet approved or cleared by the [place] FDA and  has been authorized for detection and/or diagnosis of SARS-CoV-2 by FDA under an Emergency Use Authorization (EUA). This EUA will remain  in effect (meaning this test can be used) for the duration of the COVID-19 declaration under Section 564(b)(1) of the Act, 21 U.S.C.section 360bbb-3(b)(1), unless the authorization is terminated  or revoked sooner.        Influenza A by PCR NEGATIVE NEGATIVE Final   Influenza B by PCR NEGATIVE NEGATIVE Final    Comment: (NOTE) The Xpert Xpress SARS-CoV-2/FLU/RSV plus assay is intended as an aid in the diagnosis of influenza from Nasopharyngeal swab specimens and should not be used as a sole basis for treatment. Nasal washings and aspirates are unacceptable for Xpert Xpress SARS-CoV-2/FLU/RSV testing.  Fact Sheet for Patients: EntrepreneurPulse.com.au  Fact Sheet for Healthcare Providers: IncredibleEmployment.be  This test is not yet approved or cleared by the [place] FDA and has been authorized for detection and/or diagnosis of SARS-CoV-2 by FDA under an Emergency Use Authorization (EUA). This EUA will remain in effect (meaning this test can be used) for the duration of the COVID-19 declaration under Section 564(b)(1) of the Act, 21 U.S.C. section 360bbb-3(b)(1), unless the authorization is terminated or revoked.  Performed at [facility], [place] [address] [place], [place] [address]   Urine Culture     Status: Abnormal   Collection Time: 11/02/20  1:00 AM   Specimen: In/Out Cath Urine  Result Value Ref Range Status   Specimen Description   Final    IN/OUT CATH URINE Performed at [facility] [address], [place], [place] [address]    Special Requests   Final    NONE Performed at [facility], [place] [address] [place], [place] [address]    Culture (A)  Final    <10,000 COLONIES/mL INSIGNIFICANT GROWTH Performed at [facility] [address] [place], [place] [address]    Report Status 11/03/2020 FINAL  Final     Labs: BNP (last 3 results) No results for input(s): BNP in the last 8760 hours. Basic Metabolic Panel: Recent Labs  Lab 11/01/20 2315 11/02/20 1428 11/03/20 0344 11/04/20 0351  NA 132* 136 130* 139  K 3.0* 3.6 4.0 3.8  CL 101 102 102 114*  CO2 23 23 22 23    GLUCOSE 120* 104* 116* 107*  BUN 18 11 12 13   CREATININE 0.95 0.85 0.70 0.75  CALCIUM 9.6 9.3 9.0 9.5   Liver Function Tests: Recent Labs  Lab 11/01/20 2315 11/02/20 1428  AST 18 20  ALT 18 23  ALKPHOS 73 73  BILITOT 1.0 1.4*  PROT 6.1* 6.4*  ALBUMIN 3.6 3.5   No results for input(s): LIPASE, AMYLASE in the last 168 hours. No results for input(s): AMMONIA in the last 168 hours. CBC: Recent Labs  Lab 11/01/20 2315 11/02/20 1428 11/03/20 0344 11/04/20 0351  WBC 4.1 7.1 4.7 3.4*  NEUTROABS 2.8  --   --   --   HGB 12.7* 14.3 13.0 12.1*  HCT 38.5* 43.2  38.9* 36.7*  MCV 92.8 91.3 90.3 91.5  PLT 130* 141* 124* 122*   Cardiac Enzymes: No results for input(s): CKTOTAL, CKMB, CKMBINDEX, TROPONINI in the last 168 hours. BNP: Invalid input(s): POCBNP CBG: No results for input(s): GLUCAP in the last 168 hours. D-Dimer No results for input(s): DDIMER in the last 72 hours. Hgb A1c No results for input(s): HGBA1C in the last 72 hours. Lipid Profile No results for input(s): CHOL, HDL, LDLCALC, TRIG, CHOLHDL, LDLDIRECT in the last 72 hours. Thyroid function studies No results for input(s): TSH, T4TOTAL, T3FREE, THYROIDAB in the last 72 hours.  Invalid input(s): FREET3 Anemia work up No results for input(s): VITAMINB12, FOLATE, FERRITIN, TIBC, IRON, RETICCTPCT in the last 72 hours. Urinalysis    Component Value Date/Time   COLORURINE YELLOW 11/02/2020 0100   APPEARANCEUR CLEAR 11/02/2020 0100   LABSPEC 1.019 11/02/2020 0100   PHURINE 5.0 11/02/2020 0100   GLUCOSEU NEGATIVE 11/02/2020 0100   HGBUR NEGATIVE 11/02/2020 0100   BILIRUBINUR NEGATIVE 11/02/2020 0100   KETONESUR 5 (A) 11/02/2020 0100   PROTEINUR NEGATIVE 11/02/2020 0100   NITRITE NEGATIVE 11/02/2020 0100   LEUKOCYTESUR NEGATIVE 11/02/2020 0100   Sepsis Labs Invalid input(s): PROCALCITONIN,  WBC,  LACTICIDVEN Microbiology Recent Results (from the past 240 hour(s))  Blood Culture (routine x 2)     Status:  None (Preliminary result)   Collection Time: 11/01/20 11:15 PM   Specimen: BLOOD RIGHT HAND  Result Value Ref Range Status   Specimen Description   Final    BLOOD RIGHT HAND Performed at [facility], [place] [address], [place], [place] [address]    Special Requests   Final    BOTTLES DRAWN AEROBIC AND ANAEROBIC Blood Culture adequate volume Performed at [facility] [address], [place], [place] [address]    Culture   Final    NO GROWTH 2 DAYS Performed at [facility] [address] [place], [place] [address]    Report Status PENDING  Incomplete  Blood Culture (routine x 2)     Status: None (Preliminary result)   Collection Time: 11/01/20 11:20 PM   Specimen: BLOOD LEFT HAND  Result Value Ref Range Status   Specimen Description   Final    BLOOD LEFT HAND Performed at [facility], [place] [address], [place], [place] [address]    Special Requests   Final    BOTTLES DRAWN AEROBIC AND ANAEROBIC Blood Culture adequate volume Performed at [facility] [address], [place], [place] [address]    Culture   Final    NO GROWTH 2 DAYS Performed at [facility] [address], [place], [place] [address]    Report Status PENDING  Incomplete  Resp Panel by RT-PCR (Flu A&B, Covid) Nasopharyngeal Swab     Status: None   Collection Time: 11/02/20  1:00 AM   Specimen: Nasopharyngeal Swab; Nasopharyngeal(NP) swabs in vial transport medium  Result Value Ref Range Status   SARS Coronavirus 2 by RT PCR NEGATIVE NEGATIVE Final    Comment: (NOTE) SARS-CoV-2 target nucleic acids are NOT DETECTED.  The SARS-CoV-2 RNA is generally detectable in upper respiratory specimens during the acute phase of infection. The lowest concentration of SARS-CoV-2 viral copies this assay can detect is 138 copies/mL. A negative result does not preclude SARS-Cov-2 infection and should not be used as the sole basis for treatment or other patient  management decisions. A negative result may occur with  improper  specimen collection/handling, submission of specimen other than nasopharyngeal swab, presence of viral mutation(s) within the areas targeted by this assay, and inadequate number of viral copies(<138 copies/mL). A negative result must be combined with clinical observations, patient history, and epidemiological information. The expected result is Negative.  Fact Sheet for Patients:  EntrepreneurPulse.com.au  Fact Sheet for Healthcare Providers:  IncredibleEmployment.be  This test is no t yet approved or cleared by the [place] FDA and  has been authorized for detection and/or diagnosis of SARS-CoV-2 by FDA under an Emergency Use Authorization (EUA). This EUA will remain  in effect (meaning this test can be used) for the duration of the COVID-19 declaration under Section 564(b)(1) of the Act, 21 U.S.C.section 360bbb-3(b)(1), unless the authorization is terminated  or revoked sooner.       Influenza A by PCR NEGATIVE NEGATIVE Final   Influenza B by PCR NEGATIVE NEGATIVE Final    Comment: (NOTE) The Xpert Xpress SARS-CoV-2/FLU/RSV plus assay is intended as an aid in the diagnosis of influenza from Nasopharyngeal swab specimens and should not be used as a sole basis for treatment. Nasal washings and aspirates are unacceptable for Xpert Xpress SARS-CoV-2/FLU/RSV testing.  Fact Sheet for Patients: EntrepreneurPulse.com.au  Fact Sheet for Healthcare Providers: IncredibleEmployment.be  This test is not yet approved or cleared by the [place] FDA and has been authorized for detection and/or diagnosis of SARS-CoV-2 by FDA under an Emergency Use Authorization (EUA). This EUA will remain in effect (meaning this test can be used) for the duration of the COVID-19 declaration under Section 564(b)(1) of the Act, 21 U.S.C. section 360bbb-3(b)(1),  unless the authorization is terminated or revoked.  Performed at [facility], [place] [address] [place], [place] [address]   Urine Culture     Status: Abnormal   Collection Time: 11/02/20  1:00 AM   Specimen: In/Out Cath Urine  Result Value Ref Range Status   Specimen Description   Final    IN/OUT CATH URINE Performed at [facility] [address], [place], [place] [address]    Special Requests   Final    NONE Performed at [facility], [place] [address] [place], [place] [address]    Culture (A)  Final    <10,000 COLONIES/mL INSIGNIFICANT GROWTH Performed at [facility] [address] [place], [place] [address]    Report Status 11/03/2020 FINAL  Final     Time coordinating discharge: 35 minutes  SIGNED:   Aline August, MD  [facility] 11/04/2020, 1:25 PM

## 2020-11-07 LAB — CULTURE, BLOOD (ROUTINE X 2)
Culture: NO GROWTH
Culture: NO GROWTH
Special Requests: ADEQUATE
Special Requests: ADEQUATE

## 2020-12-01 ENCOUNTER — Encounter: Payer: Self-pay | Admitting: Hematology & Oncology

## 2020-12-01 ENCOUNTER — Other Ambulatory Visit: Payer: Self-pay

## 2020-12-01 ENCOUNTER — Inpatient Hospital Stay (HOSPITAL_BASED_OUTPATIENT_CLINIC_OR_DEPARTMENT_OTHER): Payer: Medicare Other | Admitting: Hematology & Oncology

## 2020-12-01 ENCOUNTER — Inpatient Hospital Stay: Payer: Medicare Other

## 2020-12-01 ENCOUNTER — Inpatient Hospital Stay: Payer: Medicare Other | Attending: Hematology & Oncology

## 2020-12-01 VITALS — BP 93/54 | HR 80 | Temp 98.7°F | Resp 18 | Ht 75.0 in | Wt 235.1 lb

## 2020-12-01 DIAGNOSIS — C641 Malignant neoplasm of right kidney, except renal pelvis: Secondary | ICD-10-CM

## 2020-12-01 DIAGNOSIS — Z923 Personal history of irradiation: Secondary | ICD-10-CM | POA: Insufficient documentation

## 2020-12-01 DIAGNOSIS — C7951 Secondary malignant neoplasm of bone: Secondary | ICD-10-CM | POA: Diagnosis present

## 2020-12-01 DIAGNOSIS — Z87891 Personal history of nicotine dependence: Secondary | ICD-10-CM | POA: Insufficient documentation

## 2020-12-01 DIAGNOSIS — Z7952 Long term (current) use of systemic steroids: Secondary | ICD-10-CM | POA: Diagnosis not present

## 2020-12-01 DIAGNOSIS — C649 Malignant neoplasm of unspecified kidney, except renal pelvis: Secondary | ICD-10-CM | POA: Diagnosis present

## 2020-12-01 DIAGNOSIS — Z5112 Encounter for antineoplastic immunotherapy: Secondary | ICD-10-CM | POA: Insufficient documentation

## 2020-12-01 DIAGNOSIS — M549 Dorsalgia, unspecified: Secondary | ICD-10-CM | POA: Insufficient documentation

## 2020-12-01 LAB — CMP (CANCER CENTER ONLY)
ALT: 17 U/L (ref 0–44)
AST: 11 U/L — ABNORMAL LOW (ref 15–41)
Albumin: 3.7 g/dL (ref 3.5–5.0)
Alkaline Phosphatase: 67 U/L (ref 38–126)
Anion gap: 5 (ref 5–15)
BUN: 19 mg/dL (ref 8–23)
CO2: 28 mmol/L (ref 22–32)
Calcium: 10.5 mg/dL — ABNORMAL HIGH (ref 8.9–10.3)
Chloride: 109 mmol/L (ref 98–111)
Creatinine: 0.91 mg/dL (ref 0.61–1.24)
GFR, Estimated: >60 mL/min (ref >60)
Glucose, Bld: 103 mg/dL — ABNORMAL HIGH (ref 70–99)
Potassium: 3.8 mmol/L (ref 3.5–5.1)
Sodium: 142 mmol/L (ref 135–145)
Total Bilirubin: 0.5 mg/dL (ref 0.3–1.2)
Total Protein: 5.8 g/dL — ABNORMAL LOW (ref 6.5–8.1)

## 2020-12-01 LAB — CBC WITH DIFFERENTIAL (CANCER CENTER ONLY)
Abs Immature Granulocytes: 0.01 10*3/uL (ref 0.00–0.07)
Basophils Absolute: 0 10*3/uL (ref 0.0–0.1)
Basophils Relative: 1 %
Eosinophils Absolute: 0.1 10*3/uL (ref 0.0–0.5)
Eosinophils Relative: 3 %
HCT: 37.2 % — ABNORMAL LOW (ref 39.0–52.0)
Hemoglobin: 12.1 g/dL — ABNORMAL LOW (ref 13.0–17.0)
Immature Granulocytes: 0 %
Lymphocytes Relative: 38 %
Lymphs Abs: 1.7 10*3/uL (ref 0.7–4.0)
MCH: 30.9 pg (ref 26.0–34.0)
MCHC: 32.5 g/dL (ref 30.0–36.0)
MCV: 94.9 fL (ref 80.0–100.0)
Monocytes Absolute: 0.5 10*3/uL (ref 0.1–1.0)
Monocytes Relative: 12 %
Neutro Abs: 2 10*3/uL (ref 1.7–7.7)
Neutrophils Relative %: 46 %
Platelet Count: 172 10*3/uL (ref 150–400)
RBC: 3.92 MIL/uL — ABNORMAL LOW (ref 4.22–5.81)
RDW: 14.6 % (ref 11.5–15.5)
WBC Count: 4.4 10*3/uL (ref 4.0–10.5)
nRBC: 0 % (ref 0.0–0.2)

## 2020-12-01 LAB — LACTATE DEHYDROGENASE: LDH: 141 U/L (ref 98–192)

## 2020-12-01 MED ORDER — SODIUM CHLORIDE 0.9 % IV SOLN
480.0000 mg | Freq: Once | INTRAVENOUS | Status: AC
  Administered 2020-12-01: 480 mg via INTRAVENOUS
  Filled 2020-12-01: qty 48

## 2020-12-01 MED ORDER — SODIUM CHLORIDE 0.9 % IV SOLN: Freq: Once | INTRAVENOUS | Status: AC

## 2020-12-01 NOTE — Progress Notes (Signed)
Hematology and Oncology Follow Up Visit  Francis Cunningham [id number] 10-05-1950 70 y.o. 12/01/2020   Principle Diagnosis:  Metastatic renal cell carcinoma-bone only metastasis  Current Therapy:   Maintenance nivolumab-Q 4-week dosing     Interim History:  Francis Cunningham is back for follow-up.  Unfortunately, the last time we saw him, he was doing well.  He reports he had a problem a few days afterwards.  He was hospitalized.  He had nausea and vomiting.  He had a fever.  There was concern of having a stroke.  He had a CT of the brain.  This was negative.  He had cultures done.  These were all negative.  Not sure exactly what happened to him.  I will know if this is from the influenza vaccine that he had.  He had never had a problem with it before.  Currently, he feels good.  He has had no problems with diarrhea.  He has had no headache.  His mouth has been doing pretty well.  There is been no rashes.  Has a bit of leg swelling which is chronic.  Overall, I would say his performance status is ECOG 1.    Medications:  Current Outpatient Medications:    alfuzosin (UROXATRAL) 10 MG 24 hr tablet, Take 10 mg by mouth at bedtime., Disp: , Rfl:    ascorbic acid (VITAMIN C) 500 MG tablet, Take 500 mg by mouth in the morning., Disp: , Rfl:    Cholecalciferol 25 MCG (1000 UT) tablet, Take 25 Units by mouth daily., Disp: , Rfl:    escitalopram (LEXAPRO) 20 MG tablet, Take 20 mg by mouth daily., Disp: , Rfl:    finasteride (PROSCAR) 5 MG tablet, Take 5 mg by mouth daily., Disp: , Rfl:    guaiFENesin (MUCINEX) 600 MG 12 hr tablet, Take 600 mg by mouth in the morning and at bedtime., Disp: , Rfl:    Hypertonic Nasal Wash (SINUS RINSE) PACK, Irrigate with as directed., Disp: , Rfl:    Magnesium Carbonate POWD, Take 325 mg by mouth daily. 325 mg/2 teaspoons, Disp: , Rfl:    melatonin 3 MG TABS tablet, Take 3 mg by mouth at bedtime., Disp: , Rfl:    midodrine (PROAMATINE) 2.5 MG tablet, Take 1 tablet (2.5 mg  total) by mouth 3 (three) times daily., Disp: 90 tablet, Rfl: 8   montelukast (SINGULAIR) 10 MG tablet, Take 1 tablet by mouth daily., Disp: , Rfl:    Multiple Vitamin (MULTI-VITAMINS) TABS, Take 1 tablet by mouth daily., Disp: , Rfl:    nivolumab (OPDIVO) 100 MG/10ML SOLN chemo injection, Inject into the vein., Disp: , Rfl:    Omega-3 Fatty Acids (FISH OIL PO), Take 5 mLs by mouth daily., Disp: , Rfl:    polyethylene glycol powder (GLYCOLAX/MIRALAX) 17 GM/SCOOP powder, Take 17 g by mouth at bedtime., Disp: , Rfl:    Potassium Citrate-Citric Acid 3300-1002 MG PACK, Take by mouth daily., Disp: , Rfl:    predniSONE (DELTASONE) 10 MG tablet, Take 1 tablet (10 mg total) by mouth daily., Disp: 90 tablet, Rfl: 6   promethazine (PHENERGAN) 12.5 MG tablet, Take 1 tablet (12.5 mg total) by mouth every 6 (six) hours as needed for nausea or vomiting., Disp: 20 tablet, Rfl: 0   vitamin E 180 MG (400 UNITS) capsule, Take 400 Units by mouth every morning., Disp: , Rfl:   Allergies:  Allergies  Allergen Reactions   Morphine Nausea Only    Past Medical History, Surgical history, Social  history, and Family History were reviewed and updated.  Review of Systems: Review of Systems  Constitutional: Negative.   HENT:   Positive for mouth sores.   Eyes: Negative.   Respiratory: Negative.    Cardiovascular: Negative.   Gastrointestinal: Negative.   Endocrine: Negative.   Genitourinary: Negative.    Musculoskeletal:  Positive for arthralgias and back pain.  Skin: Negative.   Neurological: Negative.   Hematological: Negative.   Psychiatric/Behavioral: Negative.     Physical Exam:  height is 6\' 3"  (1.905 m) and weight is 235 lb 1.3 oz (106.6 kg). His oral temperature is 98.7 F (37.1 C). His blood pressure is 93/54 (abnormal) and his pulse is 80. His respiration is 18 and oxygen saturation is 100%.   Wt Readings from Last 3 Encounters:  12/01/20 235 lb 1.3 oz (106.6 kg)  11/01/20 230 lb (104.3 kg)   10/26/20 235 lb (106.6 kg)    Physical Exam Vitals reviewed.  HENT:     Head: Normocephalic and atraumatic.  Eyes:     Pupils: Pupils are equal, round, and reactive to light.  Cardiovascular:     Rate and Rhythm: Normal rate and regular rhythm.     Heart sounds: Normal heart sounds.  Pulmonary:     Effort: Pulmonary effort is normal.     Breath sounds: Normal breath sounds.  Abdominal:     General: Bowel sounds are normal.     Palpations: Abdomen is soft.  Musculoskeletal:        General: No tenderness or deformity. Normal range of motion.     Cervical back: Normal range of motion.  Lymphadenopathy:     Cervical: No cervical adenopathy.  Skin:    General: Skin is warm and dry.     Findings: No erythema or rash.  Neurological:     Mental Status: He is alert and oriented to person, place, and time.  Psychiatric:        Behavior: Behavior normal.        Thought Content: Thought content normal.        Judgment: Judgment normal.     Lab Results  Component Value Date   WBC 4.4 12/01/2020   HGB 12.1 (L) 12/01/2020   HCT 37.2 (L) 12/01/2020   MCV 94.9 12/01/2020   PLT 172 12/01/2020     Chemistry      Component Value Date/Time   NA 142 12/01/2020 0818   K 3.8 12/01/2020 0818   CL 109 12/01/2020 0818   CO2 28 12/01/2020 0818   BUN 19 12/01/2020 0818   CREATININE 0.91 12/01/2020 0818      Component Value Date/Time   CALCIUM 10.5 (H) 12/01/2020 0818   ALKPHOS 67 12/01/2020 0818   AST 11 (L) 12/01/2020 0818   ALT 17 12/01/2020 0818   BILITOT 0.5 12/01/2020 0818      Impression and Plan: Francis Cunningham is a very nice 70 year old white male.  He has metastatic renal cell carcinoma.  Thankfully, he is responding pretty well to immunotherapy.  I would have to say is in remission.  I am very happy to see this.  Still not sure exactly what happened with respect to this event in the hospital.  I cannot imagine this is anything from the immunotherapy.  I am not aware of any  interaction with the immunotherapy and flu vaccine.  I still think we are able to give him his treatment today.  We will plan to get him back in another  month.  We will probably do his next PET scan in December.    Volanda Napoleon, MD 11/3/20229:01 AM

## 2020-12-01 NOTE — Patient Instructions (Signed)
[facility]  Discharge Instructions: Thank you for choosing [facility] to provide your oncology and hematology care.   If you have a lab appointment with the [facility], please go directly to the [facility] and check in at the registration area.  Wear comfortable clothing and clothing appropriate for easy access to any Portacath or PICC line.   We strive to give you quality time with your provider. You may need to reschedule your appointment if you arrive late (15 or more minutes).  Arriving late affects you and other patients whose appointments are after yours.  Also, if you miss three or more appointments without notifying the office, you may be dismissed from the clinic at the provider's discretion.      For prescription refill requests, have your pharmacy contact our office and allow 72 hours for refills to be completed.    Today you received the following chemotherapy and/or immunotherapy agents: opdivo      To help prevent nausea and vomiting after your treatment, we encourage you to take your nausea medication as directed.  BELOW ARE SYMPTOMS THAT SHOULD BE REPORTED IMMEDIATELY: *FEVER GREATER THAN 100.4 F (38 C) OR HIGHER *CHILLS OR SWEATING *NAUSEA AND VOMITING THAT IS NOT CONTROLLED WITH YOUR NAUSEA MEDICATION *UNUSUAL SHORTNESS OF BREATH *UNUSUAL BRUISING OR BLEEDING *URINARY PROBLEMS (pain or burning when urinating, or frequent urination) *BOWEL PROBLEMS (unusual diarrhea, constipation, pain near the anus) TENDERNESS IN MOUTH AND THROAT WITH OR WITHOUT PRESENCE OF ULCERS (sore throat, sores in mouth, or a toothache) UNUSUAL RASH, SWELLING OR PAIN  UNUSUAL VAGINAL DISCHARGE OR ITCHING   Items with * indicate a potential emergency and should be followed up as soon as possible or go to the Emergency Department if any problems should occur.  Please show the CHEMOTHERAPY ALERT CARD or IMMUNOTHERAPY ALERT CARD at check-in to the  Emergency Department and triage nurse. Should you have questions after your visit or need to cancel or reschedule your appointment, please contact [facility]  [phone] and follow the prompts.  Office hours are 8:00 a.m. to 4:30 p.m. Monday - Friday. Please note that voicemails left after 4:00 p.m. may not be returned until the following business day.  We are closed weekends and major holidays. You have access to a nurse at all times for urgent questions. Please call the main number to the clinic [phone] and follow the prompts.  For any non-urgent questions, you may also contact your provider using MyChart. We now offer e-Visits for anyone 26 and older to request care online for non-urgent symptoms. For details visit [email or web address].   Also download the MyChart app! Go to the app store, search "MyChart", open the app, select [facility], and log in with your MyChart username and password.  Due to Covid, a mask is required upon entering the hospital/clinic. If you do not have a mask, one will be given to you upon arrival. For doctor visits, patients may have 1 support person aged 66 or older with them. For treatment visits, patients cannot have anyone with them due to current Covid guidelines and our immunocompromised population. Nivolumab injection What is this medication? NIVOLUMAB (nye VOL ue mab) is a monoclonal antibody. It treats certain types of cancer. Some of the cancers treated are colon cancer, head and neck cancer,Hodgkin lymphoma, lung cancer, and melanoma. This medicine may be used for other purposes; ask your health care provider orpharmacist if you have  questions. COMMON BRAND NAME(S): Opdivo What should I tell my care team before I take this medication? They need to know if you have any of these conditions: autoimmune diseases like Crohn's disease, ulcerative colitis, or lupus have had or planning to have an allogeneic stem cell transplant  (uses someone else's stem cells) history of chest radiation history of organ transplant nervous system problems like myasthenia gravis or Guillain-Barre syndrome an unusual or allergic reaction to nivolumab, other medicines, foods, dyes, or preservatives pregnant or trying to get pregnant breast-feeding How should I use this medication? This medicine is for infusion into a vein. It is given by a health careprofessional in a hospital or clinic setting. A special MedGuide will be given to you before each treatment. Be sure to readthis information carefully each time. Talk to your pediatrician regarding the use of this medicine in children. While this drug may be prescribed for children as young as 12 years for selectedconditions, precautions do apply. Overdosage: If you think you have taken too much of this medicine contact apoison control center or emergency room at once. NOTE: This medicine is only for you. Do not share this medicine with others. What if I miss a dose? It is important not to miss your dose. Call your doctor or health careprofessional if you are unable to keep an appointment. What may interact with this medication? Interactions have not been studied. This list may not describe all possible interactions. Give your health care provider a list of all the medicines, herbs, non-prescription drugs, or dietary supplements you use. Also tell them if you smoke, drink alcohol, or use illegaldrugs. Some items may interact with your medicine. What should I watch for while using this medication? This drug may make you feel generally unwell. Continue your course of treatmenteven though you feel ill unless your doctor tells you to stop. You may need blood work done while you are taking this medicine. Do not become pregnant while taking this medicine or for 5 months after stopping it. Women should inform their doctor if they wish to become pregnant or think they might be pregnant. There is a  potential for serious side effects to an unborn child. Talk to your health care professional or pharmacist for more information. Do not breast-feed an infant while taking this medicine orfor 5 months after stopping it. What side effects may I notice from receiving this medication? Side effects that you should report to your doctor or health care professionalas soon as possible: allergic reactions like skin rash, itching or hives, swelling of the face, lips, or tongue breathing problems blood in the urine bloody or watery diarrhea or black, tarry stools changes in emotions or moods changes in vision chest pain cough dizziness feeling faint or lightheaded, falls fever, chills headache with fever, neck stiffness, confusion, loss of memory, sensitivity to light, hallucination, loss of contact with reality, or seizures joint pain mouth sores redness, blistering, peeling or loosening of the skin, including inside the mouth severe muscle pain or weakness signs and symptoms of high blood sugar such as dizziness; dry mouth; dry skin; fruity breath; nausea; stomach pain; increased hunger or thirst; increased urination signs and symptoms of kidney injury like trouble passing urine or change in the amount of urine signs and symptoms of liver injury like dark yellow or brown urine; general ill feeling or flu-like symptoms; light-colored stools; loss of appetite; nausea; right upper belly pain; unusually weak or tired; yellowing of the eyes or skin swelling of the  ankles, feet, hands trouble passing urine or change in the amount of urine unusually weak or tired weight gain or loss Side effects that usually do not require medical attention (report to yourdoctor or health care professional if they continue or are bothersome): bone pain constipation decreased appetite diarrhea muscle pain nausea, vomiting tiredness This list may not describe all possible side effects. Call your doctor for medical  advice about side effects. You may report side effects to FDA at1-800-FDA-1088. Where should I keep my medication? This drug is given in a hospital or clinic and will not be stored at home. NOTE: This sheet is a summary. It may not cover all possible information. If you have questions about this medicine, talk to your doctor, pharmacist, orhealth care provider.  2022 Elsevier/Gold Standard (2019-05-20 10:08:25)

## 2020-12-02 ENCOUNTER — Encounter: Payer: Self-pay | Admitting: Hematology & Oncology

## 2021-01-03 ENCOUNTER — Other Ambulatory Visit: Payer: Self-pay

## 2021-01-03 ENCOUNTER — Inpatient Hospital Stay (HOSPITAL_BASED_OUTPATIENT_CLINIC_OR_DEPARTMENT_OTHER): Payer: Medicare Other | Admitting: Hematology & Oncology

## 2021-01-03 ENCOUNTER — Inpatient Hospital Stay: Payer: Medicare Other | Attending: Hematology & Oncology

## 2021-01-03 ENCOUNTER — Inpatient Hospital Stay: Payer: Medicare Other

## 2021-01-03 ENCOUNTER — Encounter: Payer: Self-pay | Admitting: Hematology & Oncology

## 2021-01-03 VITALS — BP 91/58 | HR 84 | Temp 98.4°F | Resp 18 | Wt 236.2 lb

## 2021-01-03 DIAGNOSIS — Z87891 Personal history of nicotine dependence: Secondary | ICD-10-CM | POA: Insufficient documentation

## 2021-01-03 DIAGNOSIS — Z7952 Long term (current) use of systemic steroids: Secondary | ICD-10-CM | POA: Diagnosis not present

## 2021-01-03 DIAGNOSIS — C7951 Secondary malignant neoplasm of bone: Secondary | ICD-10-CM | POA: Diagnosis present

## 2021-01-03 DIAGNOSIS — Z923 Personal history of irradiation: Secondary | ICD-10-CM | POA: Diagnosis not present

## 2021-01-03 DIAGNOSIS — Z5112 Encounter for antineoplastic immunotherapy: Secondary | ICD-10-CM | POA: Diagnosis not present

## 2021-01-03 DIAGNOSIS — C641 Malignant neoplasm of right kidney, except renal pelvis: Secondary | ICD-10-CM | POA: Diagnosis not present

## 2021-01-03 DIAGNOSIS — C649 Malignant neoplasm of unspecified kidney, except renal pelvis: Secondary | ICD-10-CM | POA: Diagnosis present

## 2021-01-03 DIAGNOSIS — M549 Dorsalgia, unspecified: Secondary | ICD-10-CM | POA: Diagnosis not present

## 2021-01-03 LAB — CBC WITH DIFFERENTIAL (CANCER CENTER ONLY)
Abs Immature Granulocytes: 0.02 10*3/uL (ref 0.00–0.07)
Basophils Absolute: 0 10*3/uL (ref 0.0–0.1)
Basophils Relative: 1 %
Eosinophils Absolute: 0.1 10*3/uL (ref 0.0–0.5)
Eosinophils Relative: 3 %
HCT: 39.5 % (ref 39.0–52.0)
Hemoglobin: 12.9 g/dL — ABNORMAL LOW (ref 13.0–17.0)
Immature Granulocytes: 0 %
Lymphocytes Relative: 30 %
Lymphs Abs: 1.5 10*3/uL (ref 0.7–4.0)
MCH: 30.5 pg (ref 26.0–34.0)
MCHC: 32.7 g/dL (ref 30.0–36.0)
MCV: 93.4 fL (ref 80.0–100.0)
Monocytes Absolute: 0.5 10*3/uL (ref 0.1–1.0)
Monocytes Relative: 11 %
Neutro Abs: 2.7 10*3/uL (ref 1.7–7.7)
Neutrophils Relative %: 55 %
Platelet Count: 186 10*3/uL (ref 150–400)
RBC: 4.23 MIL/uL (ref 4.22–5.81)
RDW: 13.9 % (ref 11.5–15.5)
WBC Count: 4.9 10*3/uL (ref 4.0–10.5)
nRBC: 0 % (ref 0.0–0.2)

## 2021-01-03 LAB — CMP (CANCER CENTER ONLY)
ALT: 17 U/L (ref 0–44)
AST: 11 U/L — ABNORMAL LOW (ref 15–41)
Albumin: 3.8 g/dL (ref 3.5–5.0)
Alkaline Phosphatase: 77 U/L (ref 38–126)
Anion gap: 7 (ref 5–15)
BUN: 24 mg/dL — ABNORMAL HIGH (ref 8–23)
CO2: 25 mmol/L (ref 22–32)
Calcium: 10.6 mg/dL — ABNORMAL HIGH (ref 8.9–10.3)
Chloride: 108 mmol/L (ref 98–111)
Creatinine: 1.04 mg/dL (ref 0.61–1.24)
GFR, Estimated: >60 mL/min (ref >60)
Glucose, Bld: 108 mg/dL — ABNORMAL HIGH (ref 70–99)
Potassium: 3.8 mmol/L (ref 3.5–5.1)
Sodium: 140 mmol/L (ref 135–145)
Total Bilirubin: 0.4 mg/dL (ref 0.3–1.2)
Total Protein: 6.1 g/dL — ABNORMAL LOW (ref 6.5–8.1)

## 2021-01-03 LAB — LACTATE DEHYDROGENASE: LDH: 153 U/L (ref 98–192)

## 2021-01-03 MED ORDER — SODIUM CHLORIDE 0.9 % IV SOLN
480.0000 mg | Freq: Once | INTRAVENOUS | Status: AC
  Administered 2021-01-03: 480 mg via INTRAVENOUS
  Filled 2021-01-03: qty 48

## 2021-01-03 MED ORDER — SODIUM CHLORIDE 0.9 % IV SOLN: Freq: Once | INTRAVENOUS | Status: AC

## 2021-01-03 NOTE — Progress Notes (Signed)
Hematology and Oncology Follow Up Visit  Francis Cunningham [id number] 08/28/1950 70 y.o. 01/03/2021   Principle Diagnosis:  Metastatic renal cell carcinoma-bone only metastasis  Current Therapy:   Maintenance nivolumab-Q 4-week dosing     Interim History:  Francis Cunningham is back for follow-up.  He is on quite nicely.  He had a wonderful Thanksgiving.  Unfortunately, his wife tested positive for COVID this morning.  I told him that he does need to avoid her until she is COVID-negative.  Is not complain of any pain.  Has had no problems with nausea or vomiting.  Has had no change in bowel or bladder habits.  He has had no bleeding.  He has had no cough or shortness of breath.  He has had no fever.  He has had no rashes.  There has been a little bit of leg swelling but this is been chronic.  Thankfully, he has had no further neurological issues.  Not sure what happened the last time that he had treatment or afterwards.  Currently, I would say his performance status is ECOG 0.     Medications:  Current Outpatient Medications:  .  alfuzosin (UROXATRAL) 10 MG 24 hr tablet, Take 10 mg by mouth at bedtime., Disp: , Rfl:  .  ascorbic acid (VITAMIN C) 500 MG tablet, Take 500 mg by mouth in the morning., Disp: , Rfl:  .  Cholecalciferol 25 MCG (1000 UT) tablet, Take 25 Units by mouth daily., Disp: , Rfl:  .  escitalopram (LEXAPRO) 20 MG tablet, Take 20 mg by mouth daily., Disp: , Rfl:  .  finasteride (PROSCAR) 5 MG tablet, Take 5 mg by mouth daily., Disp: , Rfl:  .  guaiFENesin (MUCINEX) 600 MG 12 hr tablet, Take 600 mg by mouth in the morning and at bedtime., Disp: , Rfl:  .  Hypertonic Nasal Wash (SINUS RINSE) PACK, Irrigate with as directed., Disp: , Rfl:  .  Magnesium Carbonate POWD, Take 325 mg by mouth daily. 325 mg/2 teaspoons, Disp: , Rfl:  .  melatonin 3 MG TABS tablet, Take 3 mg by mouth at bedtime., Disp: , Rfl:  .  midodrine (PROAMATINE) 2.5 MG tablet, Take 1 tablet (2.5 mg total) by mouth 3  (three) times daily., Disp: 90 tablet, Rfl: 8 .  montelukast (SINGULAIR) 10 MG tablet, Take 1 tablet by mouth daily., Disp: , Rfl:  .  Multiple Vitamin (MULTI-VITAMINS) TABS, Take 1 tablet by mouth daily., Disp: , Rfl:  .  nivolumab (OPDIVO) 100 MG/10ML SOLN chemo injection, Inject into the vein., Disp: , Rfl:  .  Omega-3 Fatty Acids (FISH OIL PO), Take 5 mLs by mouth daily., Disp: , Rfl:  .  polyethylene glycol powder (GLYCOLAX/MIRALAX) 17 GM/SCOOP powder, Take 17 g by mouth at bedtime., Disp: , Rfl:  .  Potassium Citrate-Citric Acid 3300-1002 MG PACK, Take by mouth daily., Disp: , Rfl:  .  predniSONE (DELTASONE) 10 MG tablet, Take 1 tablet (10 mg total) by mouth daily., Disp: 90 tablet, Rfl: 6 .  promethazine (PHENERGAN) 12.5 MG tablet, Take 1 tablet (12.5 mg total) by mouth every 6 (six) hours as needed for nausea or vomiting., Disp: 20 tablet, Rfl: 0 .  vitamin E 180 MG (400 UNITS) capsule, Take 400 Units by mouth every morning., Disp: , Rfl:   Allergies:  Allergies  Allergen Reactions  . Morphine Nausea Only    Past Medical History, Surgical history, Social history, and Family History were reviewed and updated.  Review of Systems: Review  of Systems  Constitutional: Negative.   HENT:   Positive for mouth sores.   Eyes: Negative.   Respiratory: Negative.    Cardiovascular: Negative.   Gastrointestinal: Negative.   Endocrine: Negative.   Genitourinary: Negative.    Musculoskeletal:  Positive for arthralgias and back pain.  Skin: Negative.   Neurological: Negative.   Hematological: Negative.   Psychiatric/Behavioral: Negative.     Physical Exam:  weight is 236 lb 4 oz (107.2 kg). His oral temperature is 98.4 F (36.9 C). His blood pressure is 91/58 (abnormal) and his pulse is 84. His respiration is 18 and oxygen saturation is 98%.   Wt Readings from Last 3 Encounters:  01/03/21 236 lb 4 oz (107.2 kg)  12/01/20 235 lb 1.3 oz (106.6 kg)  11/01/20 230 lb (104.3 kg)     Physical Exam Vitals reviewed.  HENT:     Head: Normocephalic and atraumatic.  Eyes:     Pupils: Pupils are equal, round, and reactive to light.  Cardiovascular:     Rate and Rhythm: Normal rate and regular rhythm.     Heart sounds: Normal heart sounds.  Pulmonary:     Effort: Pulmonary effort is normal.     Breath sounds: Normal breath sounds.  Abdominal:     General: Bowel sounds are normal.     Palpations: Abdomen is soft.  Musculoskeletal:        General: No tenderness or deformity. Normal range of motion.     Cervical back: Normal range of motion.  Lymphadenopathy:     Cervical: No cervical adenopathy.  Skin:    General: Skin is warm and dry.     Findings: No erythema or rash.  Neurological:     Mental Status: He is alert and oriented to person, place, and time.  Psychiatric:        Behavior: Behavior normal.        Thought Content: Thought content normal.        Judgment: Judgment normal.     Lab Results  Component Value Date   WBC 4.9 01/03/2021   HGB 12.9 (L) 01/03/2021   HCT 39.5 01/03/2021   MCV 93.4 01/03/2021   PLT 186 01/03/2021     Chemistry      Component Value Date/Time   NA 140 01/03/2021 0819   K 3.8 01/03/2021 0819   CL 108 01/03/2021 0819   CO2 25 01/03/2021 0819   BUN 24 (H) 01/03/2021 0819   CREATININE 1.04 01/03/2021 0819      Component Value Date/Time   CALCIUM 10.6 (H) 01/03/2021 0819   ALKPHOS 77 01/03/2021 0819   AST 11 (L) 01/03/2021 0819   ALT 17 01/03/2021 0819   BILITOT 0.4 01/03/2021 0819      Impression and Plan: Francis Cunningham is a very nice 70 year old white male.  He has metastatic renal cell carcinoma.  Thankfully, he is responding pretty well to immunotherapy.  I would have to say is in remission.  I am very happy to see this.  We will go ahead and plan to set him up with a PET scan after this treatment.  Is happy that his quality life is doing so well.  I know his wife will get through [facility].  Again I told him  just to try to stay away from her until she test negative.  We will plan to get her back after the Christmas holiday.      Volanda Napoleon, MD 12/6/20229:27 AM

## 2021-01-03 NOTE — Patient Instructions (Signed)
[facility]  Discharge Instructions: Thank you for choosing [facility] to provide your oncology and hematology care.   If you have a lab appointment with the Cancer Center, please go directly to the Cancer Center and check in at the registration area.  Wear comfortable clothing and clothing appropriate for easy access to any Portacath or PICC line.   We strive to give you quality time with your provider. You may need to reschedule your appointment if you arrive late (15 or more minutes).  Arriving late affects you and other patients whose appointments are after yours.  Also, if you miss three or more appointments without notifying the office, you may be dismissed from the clinic at the provider's discretion.      For prescription refill requests, have your pharmacy contact our office and allow 72 hours for refills to be completed.    Today you received the following chemotherapy and/or immunotherapy agents:  Opdivo      To help prevent nausea and vomiting after your treatment, we encourage you to take your nausea medication as directed.  BELOW ARE SYMPTOMS THAT SHOULD BE REPORTED IMMEDIATELY: *FEVER GREATER THAN 100.4 F (38 C) OR HIGHER *CHILLS OR SWEATING *NAUSEA AND VOMITING THAT IS NOT CONTROLLED WITH YOUR NAUSEA MEDICATION *UNUSUAL SHORTNESS OF BREATH *UNUSUAL BRUISING OR BLEEDING *URINARY PROBLEMS (pain or burning when urinating, or frequent urination) *BOWEL PROBLEMS (unusual diarrhea, constipation, pain near the anus) TENDERNESS IN MOUTH AND THROAT WITH OR WITHOUT PRESENCE OF ULCERS (sore throat, sores in mouth, or a toothache) UNUSUAL RASH, SWELLING OR PAIN  UNUSUAL VAGINAL DISCHARGE OR ITCHING   Items with * indicate a potential emergency and should be followed up as soon as possible or go to the Emergency Department if any problems should occur.  Please show the CHEMOTHERAPY ALERT CARD or IMMUNOTHERAPY ALERT CARD at check-in to the  Emergency Department and triage nurse. Should you have questions after your visit or need to cancel or reschedule your appointment, please contact [facility]  [phone] and follow the prompts.  Office hours are 8:00 a.m. to 4:30 p.m. Monday - Friday. Please note that voicemails left after 4:00 p.m. may not be returned until the following business day.  We are closed weekends and major holidays. You have access to a nurse at all times for urgent questions. Please call the main number to the clinic [phone] and follow the prompts.  For any non-urgent questions, you may also contact your provider using MyChart. We now offer e-Visits for anyone 18 and older to request care online for non-urgent symptoms. For details visit [email or web address].   Also download the MyChart app! Go to the app store, search "MyChart", open the app, select , and log in with your MyChart username and password.  Due to Covid, a mask is required upon entering the hospital/clinic. If you do not have a mask, one will be given to you upon arrival. For doctor visits, patients may have 1 support person aged 18 or older with them. For treatment visits, patients cannot have anyone with them due to current Covid guidelines and our immunocompromised population.  

## 2021-01-25 ENCOUNTER — Other Ambulatory Visit: Payer: Self-pay

## 2021-01-25 ENCOUNTER — Ambulatory Visit (HOSPITAL_COMMUNITY)
Admission: RE | Admit: 2021-01-25 | Discharge: 2021-01-25 | Disposition: A | Discharge status: Home / Self Care | Payer: Medicare Other | Source: Ambulatory Visit | Attending: Hematology & Oncology | Admitting: Hematology & Oncology

## 2021-01-25 DIAGNOSIS — C641 Malignant neoplasm of right kidney, except renal pelvis: Secondary | ICD-10-CM | POA: Insufficient documentation

## 2021-01-25 LAB — GLUCOSE, CAPILLARY: Glucose-Capillary: 102 mg/dL — ABNORMAL HIGH (ref 70–99)

## 2021-01-25 IMAGING — CT NM PET TUM IMG RESTAG (PS) SKULL BASE T - THIGH
1 of 8 series · 1 of 25 positions shown · non-contrast
Comparison: Prior PET CTs from [DATE] and [DATE]

CLINICAL DATA: Subsequent treatment strategy for metastatic renal
cell carcinoma.

EXAM:
NUCLEAR MEDICINE PET SKULL BASE TO THIGH
TECHNIQUE: 11.5 mCi F-18 FDG was injected intravenously. Full-ring PET imaging
was performed from the skull base to thigh after the radiotracer. CT
data was obtained and used for attenuation correction and anatomic
localization.
Fasting blood glucose: 102 mg/dl

[Series 3: pet sk_thigh ac · axial · 5.0mm · 4.07mm/px · 1 of 251 slices shown]
[im 126/251]
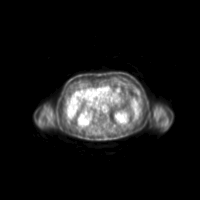

[1 of 25 positions shown; findings below may reference images not displayed]

FINDINGS: Mediastinal blood pool activity: SUV max

Liver activity: SUV max NA

NECK: No hypermetabolic lymph nodes in the neck.

Incidental CT findings: none

CHEST: No hypermetabolic mediastinal or hilar nodes. No suspicious
pulmonary nodules on the CT scan.

Incidental CT findings: none

ABDOMEN/PELVIS: No abnormal hypermetabolic activity within the
liver, pancreas, adrenal glands, or spleen. No hypermetabolic lymph
nodes in the abdomen or pelvis.

Incidental CT findings: No significant findings.

SKELETON: Artifact associated with thoracic fusion hardware and
right hip hardware. No findings suspicious for osseous metastatic
disease.

Incidental CT findings: Treated mixed lytic and sclerotic lesion
involving the right pubic bone, the left iliac bone, the thoracic
spine, the sternum and the right humeral neck.
IMPRESSION: Stable PET-CT.  No findings for recurrent metastatic disease.

## 2021-01-25 MED ORDER — FLUDEOXYGLUCOSE F - 18 (FDG) INJECTION
12.3000 | Freq: Once | INTRAVENOUS | Status: AC
  Administered 2021-01-25: 08:00:00 11.5 via INTRAVENOUS

## 2021-01-26 ENCOUNTER — Telehealth: Payer: Self-pay | Admitting: *Deleted

## 2021-01-26 NOTE — Telephone Encounter (Signed)
Notified pt of results, pt verbalized understanding. No concerns at this time. 

## 2021-01-26 NOTE — Telephone Encounter (Signed)
-----   Message from Volanda Napoleon, MD sent at 01/26/2021  6:56 AM EST ----- Call - the PER scan does not show any obvious active cancer!!    Happy New year!!  pete

## 2021-01-31 ENCOUNTER — Inpatient Hospital Stay: Payer: Medicare Other | Attending: Hematology & Oncology

## 2021-01-31 ENCOUNTER — Inpatient Hospital Stay (HOSPITAL_BASED_OUTPATIENT_CLINIC_OR_DEPARTMENT_OTHER): Payer: Medicare Other | Admitting: Hematology & Oncology

## 2021-01-31 ENCOUNTER — Inpatient Hospital Stay: Payer: Medicare Other

## 2021-01-31 ENCOUNTER — Other Ambulatory Visit: Payer: Self-pay

## 2021-01-31 VITALS — BP 89/46 | HR 61

## 2021-01-31 VITALS — BP 92/58 | HR 63 | Temp 98.2°F | Resp 18 | Wt 238.0 lb

## 2021-01-31 DIAGNOSIS — C7951 Secondary malignant neoplasm of bone: Secondary | ICD-10-CM | POA: Diagnosis present

## 2021-01-31 DIAGNOSIS — M549 Dorsalgia, unspecified: Secondary | ICD-10-CM | POA: Diagnosis not present

## 2021-01-31 DIAGNOSIS — C641 Malignant neoplasm of right kidney, except renal pelvis: Secondary | ICD-10-CM | POA: Diagnosis not present

## 2021-01-31 DIAGNOSIS — Z5112 Encounter for antineoplastic immunotherapy: Secondary | ICD-10-CM | POA: Insufficient documentation

## 2021-01-31 DIAGNOSIS — C649 Malignant neoplasm of unspecified kidney, except renal pelvis: Secondary | ICD-10-CM | POA: Diagnosis present

## 2021-01-31 DIAGNOSIS — Z923 Personal history of irradiation: Secondary | ICD-10-CM | POA: Diagnosis not present

## 2021-01-31 DIAGNOSIS — Z87891 Personal history of nicotine dependence: Secondary | ICD-10-CM | POA: Insufficient documentation

## 2021-01-31 DIAGNOSIS — Z7952 Long term (current) use of systemic steroids: Secondary | ICD-10-CM | POA: Insufficient documentation

## 2021-01-31 LAB — CBC WITH DIFFERENTIAL (CANCER CENTER ONLY)
Abs Immature Granulocytes: 0.03 10*3/uL (ref 0.00–0.07)
Basophils Absolute: 0 10*3/uL (ref 0.0–0.1)
Basophils Relative: 1 %
Eosinophils Absolute: 0.1 10*3/uL (ref 0.0–0.5)
Eosinophils Relative: 1 %
HCT: 39.2 % (ref 39.0–52.0)
Hemoglobin: 12.9 g/dL — ABNORMAL LOW (ref 13.0–17.0)
Immature Granulocytes: 1 %
Lymphocytes Relative: 17 %
Lymphs Abs: 1.1 10*3/uL (ref 0.7–4.0)
MCH: 30.6 pg (ref 26.0–34.0)
MCHC: 32.9 g/dL (ref 30.0–36.0)
MCV: 93.1 fL (ref 80.0–100.0)
Monocytes Absolute: 0.5 10*3/uL (ref 0.1–1.0)
Monocytes Relative: 9 %
Neutro Abs: 4.6 10*3/uL (ref 1.7–7.7)
Neutrophils Relative %: 71 %
Platelet Count: 180 10*3/uL (ref 150–400)
RBC: 4.21 MIL/uL — ABNORMAL LOW (ref 4.22–5.81)
RDW: 13.7 % (ref 11.5–15.5)
WBC Count: 6.4 10*3/uL (ref 4.0–10.5)
nRBC: 0 % (ref 0.0–0.2)

## 2021-01-31 LAB — CMP (CANCER CENTER ONLY)
ALT: 15 U/L (ref 0–44)
AST: 11 U/L — ABNORMAL LOW (ref 15–41)
Albumin: 3.9 g/dL (ref 3.5–5.0)
Alkaline Phosphatase: 77 U/L (ref 38–126)
Anion gap: 6 (ref 5–15)
BUN: 26 mg/dL — ABNORMAL HIGH (ref 8–23)
CO2: 25 mmol/L (ref 22–32)
Calcium: 10.3 mg/dL (ref 8.9–10.3)
Chloride: 109 mmol/L (ref 98–111)
Creatinine: 0.99 mg/dL (ref 0.61–1.24)
GFR, Estimated: >60 mL/min (ref >60)
Glucose, Bld: 112 mg/dL — ABNORMAL HIGH (ref 70–99)
Potassium: 3.9 mmol/L (ref 3.5–5.1)
Sodium: 140 mmol/L (ref 135–145)
Total Bilirubin: 0.6 mg/dL (ref 0.3–1.2)
Total Protein: 5.9 g/dL — ABNORMAL LOW (ref 6.5–8.1)

## 2021-01-31 LAB — LACTATE DEHYDROGENASE: LDH: 129 U/L (ref 98–192)

## 2021-01-31 MED ORDER — SODIUM CHLORIDE 0.9 % IV SOLN: Freq: Once | INTRAVENOUS | Status: AC

## 2021-01-31 MED ORDER — SODIUM CHLORIDE 0.9 % IV SOLN
480.0000 mg | Freq: Once | INTRAVENOUS | Status: AC
  Administered 2021-01-31: 480 mg via INTRAVENOUS
  Filled 2021-01-31: qty 48

## 2021-01-31 NOTE — Progress Notes (Signed)
Hematology and Oncology Follow Up Visit  Francis Cunningham [id number] 1950/09/04 71 y.o. 01/31/2021   Principle Diagnosis:  Metastatic renal cell carcinoma-bone only metastasis  Current Therapy:   Maintenance nivolumab-Q 4-week dosing     Interim History:  Francis Cunningham is back for follow-up.  He looks good.  He had a wonderful Christmas and New Year's.  Thankfully, his wife got over [place].  We did do a PET scan on him.  This was done on 01/25/2021.  The PET scan looks fantastic.  There is no evidence of active disease with his bones.  Has been having a little bit of trouble with his right shoulder.  It sounds like it might be rotator cuff issues.  I told him that probably talking to his orthopedic oncologist would be a good start.  If he thinks this can be taken care of locally, we will refer to Dr. Rhona Raider of [facility].  He has had a good appetite.  He has had no nausea or vomiting.  He has had no change in bowel or bladder habits.  There has been no bleeding.  He has had no cough or shortness of breath.  There is been no headache.  Overall, his performance status is ECOG 1.     Medications:  Current Outpatient Medications:    LORazepam (ATIVAN) 1 MG tablet, Take 1 mg by mouth as needed. Ona tablest as needed for procedures and imaging, Disp: , Rfl:    propranolol (INDERAL) 10 MG tablet, Take 10 mg by mouth daily as needed. For anxiety, Disp: , Rfl:    alfuzosin (UROXATRAL) 10 MG 24 hr tablet, Take 10 mg by mouth at bedtime., Disp: , Rfl:    ascorbic acid (VITAMIN C) 500 MG tablet, Take 500 mg by mouth in the morning., Disp: , Rfl:    Cholecalciferol 25 MCG (1000 UT) tablet, Take 25 Units by mouth daily., Disp: , Rfl:    escitalopram (LEXAPRO) 20 MG tablet, Take 20 mg by mouth daily., Disp: , Rfl:    finasteride (PROSCAR) 5 MG tablet, Take 5 mg by mouth daily., Disp: , Rfl:    guaiFENesin (MUCINEX) 600 MG 12 hr tablet, Take 600 mg by mouth in the morning and at bedtime.,  Disp: , Rfl:    Hypertonic Nasal Wash (SINUS RINSE) PACK, Irrigate with as directed., Disp: , Rfl:    Magnesium Carbonate POWD, Take 325 mg by mouth daily. 325 mg/2 teaspoons, Disp: , Rfl:    melatonin 3 MG TABS tablet, Take 3 mg by mouth at bedtime., Disp: , Rfl:    midodrine (PROAMATINE) 2.5 MG tablet, Take 1 tablet (2.5 mg total) by mouth 3 (three) times daily., Disp: 90 tablet, Rfl: 8   montelukast (SINGULAIR) 10 MG tablet, Take 1 tablet by mouth daily., Disp: , Rfl:    Multiple Vitamin (MULTI-VITAMINS) TABS, Take 1 tablet by mouth daily., Disp: , Rfl:    nivolumab (OPDIVO) 100 MG/10ML SOLN chemo injection, Inject into the vein., Disp: , Rfl:    Omega-3 Fatty Acids (FISH OIL PO), Take 5 mLs by mouth daily., Disp: , Rfl:    polyethylene glycol powder (GLYCOLAX/MIRALAX) 17 GM/SCOOP powder, Take 17 g by mouth at bedtime., Disp: , Rfl:    Potassium Citrate-Citric Acid 3300-1002 MG PACK, Take by mouth daily., Disp: , Rfl:    predniSONE (DELTASONE) 10 MG tablet, Take 1 tablet (10 mg total) by mouth daily., Disp: 90 tablet, Rfl: 6   promethazine (PHENERGAN) 12.5 MG tablet, Take 1  tablet (12.5 mg total) by mouth every 6 (six) hours as needed for nausea or vomiting., Disp: 20 tablet, Rfl: 0   vitamin E 180 MG (400 UNITS) capsule, Take 400 Units by mouth every morning., Disp: , Rfl:  No current facility-administered medications for this visit.  Facility-Administered Medications Ordered in Other Visits:    nivolumab (OPDIVO) 480 mg in sodium chloride 0.9 % 100 mL chemo infusion, 480 mg, Intravenous, Once, Volanda Napoleon, MD, Last Rate: 296 mL/hr at 01/31/21 1451, 480 mg at 01/31/21 1451  Allergies:  Allergies  Allergen Reactions   Morphine Nausea Only    Past Medical History, Surgical history, Social history, and Family History were reviewed and updated.  Review of Systems: Review of Systems  Constitutional: Negative.   HENT:   Positive for mouth sores.   Eyes: Negative.   Respiratory:  Negative.    Cardiovascular: Negative.   Gastrointestinal: Negative.   Endocrine: Negative.   Genitourinary: Negative.    Musculoskeletal:  Positive for arthralgias and back pain.  Skin: Negative.   Neurological: Negative.   Hematological: Negative.   Psychiatric/Behavioral: Negative.     Physical Exam:  weight is 238 lb (108 kg). His oral temperature is 98.2 F (36.8 C). His blood pressure is 92/58 (abnormal) and his pulse is 63. His respiration is 18 and oxygen saturation is 97%.   Wt Readings from Last 3 Encounters:  01/31/21 238 lb (108 kg)  01/03/21 236 lb 4 oz (107.2 kg)  12/01/20 235 lb 1.3 oz (106.6 kg)    Physical Exam Vitals reviewed.  HENT:     Head: Normocephalic and atraumatic.  Eyes:     Pupils: Pupils are equal, round, and reactive to light.  Cardiovascular:     Rate and Rhythm: Normal rate and regular rhythm.     Heart sounds: Normal heart sounds.  Pulmonary:     Effort: Pulmonary effort is normal.     Breath sounds: Normal breath sounds.  Abdominal:     General: Bowel sounds are normal.     Palpations: Abdomen is soft.  Musculoskeletal:        General: No tenderness or deformity. Normal range of motion.     Cervical back: Normal range of motion.  Lymphadenopathy:     Cervical: No cervical adenopathy.  Skin:    General: Skin is warm and dry.     Findings: No erythema or rash.  Neurological:     Mental Status: He is alert and oriented to person, place, and time.  Psychiatric:        Behavior: Behavior normal.        Thought Content: Thought content normal.        Judgment: Judgment normal.     Lab Results  Component Value Date   WBC 6.4 01/31/2021   HGB 12.9 (L) 01/31/2021   HCT 39.2 01/31/2021   MCV 93.1 01/31/2021   PLT 180 01/31/2021     Chemistry      Component Value Date/Time   NA 140 01/31/2021 1315   K 3.9 01/31/2021 1315   CL 109 01/31/2021 1315   CO2 25 01/31/2021 1315   BUN 26 (H) 01/31/2021 1315   CREATININE 0.99  01/31/2021 1315      Component Value Date/Time   CALCIUM 10.3 01/31/2021 1315   ALKPHOS 77 01/31/2021 1315   AST 11 (L) 01/31/2021 1315   ALT 15 01/31/2021 1315   BILITOT 0.6 01/31/2021 1315      Impression and Plan:  Francis Cunningham is a very nice 71 year old white male.  He has metastatic renal cell carcinoma.  Thankfully, he is responding pretty well to immunotherapy.  I would have to say he is in remission.  I am very happy to see this.  I do not think we need another PET scan probably until March.  We will continue him on the monthly nivolumab.  I think this is working very well.  Again, we will have to watch out for the rotator cuff issues with the right shoulder.  If we can help out with this we will.  I will plan to get him back in another month     Volanda Napoleon, MD 1/3/20233:07 PM

## 2021-01-31 NOTE — Patient Instructions (Signed)
Nivolumab injection °What is this medication? °NIVOLUMAB (nye VOL ue mab) is a monoclonal antibody. It treats certain types of cancer. Some of the cancers treated are colon cancer, head and neck cancer, Hodgkin lymphoma, lung cancer, and melanoma. °This medicine may be used for other purposes; ask your health care provider or pharmacist if you have questions. °COMMON BRAND NAME(S): Opdivo °What should I tell my care team before I take this medication? °They need to know if you have any of these conditions: °Autoimmune diseases such as Crohn's disease, ulcerative colitis, or lupus °Have had or planning to have an allogeneic stem cell transplant (uses someone else's stem cells) °History of chest radiation °Organ transplant °Nervous system problems such as myasthenia gravis or Guillain-Barre syndrome °An unusual or allergic reaction to nivolumab, other medicines, foods, dyes, or preservatives °Pregnant or trying to get pregnant °Breast-feeding °How should I use this medication? °This medication is injected into a vein. It is given in a hospital or clinic setting. °A special MedGuide will be given to you before each treatment. Be sure to read this information carefully each time. °Talk to your care team regarding the use of this medication in children. While it may be prescribed for children as young as 12 years for selected conditions, precautions do apply. °Overdosage: If you think you have taken too much of this medicine contact a poison control center or emergency room at once. °NOTE: This medicine is only for you. Do not share this medicine with others. °What if I miss a dose? °Keep appointments for follow-up doses. It is important not to miss your dose. Call your care team if you are unable to keep an appointment. °What may interact with this medication? °Interactions have not been studied. °This list may not describe all possible interactions. Give your health care provider a list of all the medicines, herbs,  non-prescription drugs, or dietary supplements you use. Also tell them if you smoke, drink alcohol, or use illegal drugs. Some items may interact with your medicine. °What should I watch for while using this medication? °Your condition will be monitored carefully while you are receiving this medication. °You may need blood work done while you are taking this medication. °Do not become pregnant while taking this medication or for 5 months after stopping it. Women should inform their care team if they wish to become pregnant or think they might be pregnant. There is a potential for serious harm to an unborn child. Talk to your care team for more information. Do not breast-feed an infant while taking this medication or for 5 months after stopping it. °What side effects may I notice from receiving this medication? °Side effects that you should report to your care team as soon as possible: °Allergic reactions--skin rash, itching, hives, swelling of the face, lips, tongue, or throat °Bloody or black, tar-like stools °Change in vision °Chest pain °Diarrhea °Dry cough, shortness of breath or trouble breathing °Eye pain °Fast or irregular heartbeat °Fever, chills °High blood sugar (hyperglycemia)--increased thirst or amount of urine, unusual weakness or fatigue, blurry vision °High thyroid levels (hyperthyroidism)--fast or irregular heartbeat, weight loss, excessive sweating or sensitivity to heat, tremors or shaking, anxiety, nervousness, irregular menstrual cycle or spotting °Kidney injury--decrease in the amount of urine, swelling of the ankles, hands, or feet °Liver injury--right upper belly pain, loss of appetite, nausea, light-colored stool, dark yellow or brown urine, yellowing skin or eyes, unusual weakness or fatigue °Low red blood cell count--unusual weakness or fatigue, dizziness, headache, trouble breathing °  Low thyroid levels (hypothyroidism)--unusual weakness or fatigue, increased sensitivity to cold,  constipation, hair loss, dry skin, weight gain, feelings of depression °Mood and behavior changes-confusion, change in sex drive or performance, irritability °Muscle pain or cramps °Pain, tingling, or numbness in the hands or feet, muscle weakness, trouble walking, loss of balance or coordination °Red or dark brown urine °Redness, blistering, peeling, or loosening of the skin, including inside the mouth °Stomach pain °Unusual bruising or bleeding °Side effects that usually do not require medical attention (report to your care team if they continue or are bothersome): °Bone pain °Constipation °Loss of appetite °Nausea °Tiredness °Vomiting °This list may not describe all possible side effects. Call your doctor for medical advice about side effects. You may report side effects to FDA at 1-800-FDA-1088. °Where should I keep my medication? °This medication is given in a hospital or clinic and will not be stored at home. °NOTE: This sheet is a summary. It may not cover all possible information. If you have questions about this medicine, talk to your doctor, pharmacist, or health care provider. °© 2022 [employer]vier/Gold Standard (2020-10-04 00:00:00) ° °

## 2021-03-01 ENCOUNTER — Telehealth: Payer: Self-pay | Admitting: *Deleted

## 2021-03-01 ENCOUNTER — Encounter: Payer: Self-pay | Admitting: Hematology & Oncology

## 2021-03-01 ENCOUNTER — Other Ambulatory Visit: Payer: Self-pay

## 2021-03-01 ENCOUNTER — Inpatient Hospital Stay (HOSPITAL_BASED_OUTPATIENT_CLINIC_OR_DEPARTMENT_OTHER): Payer: Medicare Other | Admitting: Hematology & Oncology

## 2021-03-01 ENCOUNTER — Inpatient Hospital Stay: Payer: Medicare Other

## 2021-03-01 ENCOUNTER — Inpatient Hospital Stay: Payer: Medicare Other | Attending: Hematology & Oncology

## 2021-03-01 VITALS — BP 78/56 | HR 63 | Temp 97.9°F | Resp 18 | Wt 240.0 lb

## 2021-03-01 VITALS — BP 90/53 | HR 55 | Resp 17

## 2021-03-01 DIAGNOSIS — Z923 Personal history of irradiation: Secondary | ICD-10-CM | POA: Diagnosis not present

## 2021-03-01 DIAGNOSIS — M549 Dorsalgia, unspecified: Secondary | ICD-10-CM | POA: Insufficient documentation

## 2021-03-01 DIAGNOSIS — C649 Malignant neoplasm of unspecified kidney, except renal pelvis: Secondary | ICD-10-CM | POA: Diagnosis present

## 2021-03-01 DIAGNOSIS — Z7952 Long term (current) use of systemic steroids: Secondary | ICD-10-CM | POA: Diagnosis not present

## 2021-03-01 DIAGNOSIS — C7951 Secondary malignant neoplasm of bone: Secondary | ICD-10-CM | POA: Insufficient documentation

## 2021-03-01 DIAGNOSIS — C641 Malignant neoplasm of right kidney, except renal pelvis: Secondary | ICD-10-CM

## 2021-03-01 DIAGNOSIS — Z87891 Personal history of nicotine dependence: Secondary | ICD-10-CM | POA: Insufficient documentation

## 2021-03-01 DIAGNOSIS — Z5112 Encounter for antineoplastic immunotherapy: Secondary | ICD-10-CM | POA: Insufficient documentation

## 2021-03-01 LAB — CBC WITH DIFFERENTIAL (CANCER CENTER ONLY)
Abs Immature Granulocytes: 0.03 10*3/uL (ref 0.00–0.07)
Basophils Absolute: 0 10*3/uL (ref 0.0–0.1)
Basophils Relative: 1 %
Eosinophils Absolute: 0.1 10*3/uL (ref 0.0–0.5)
Eosinophils Relative: 2 %
HCT: 38.3 % — ABNORMAL LOW (ref 39.0–52.0)
Hemoglobin: 12.7 g/dL — ABNORMAL LOW (ref 13.0–17.0)
Immature Granulocytes: 1 %
Lymphocytes Relative: 34 %
Lymphs Abs: 1.6 10*3/uL (ref 0.7–4.0)
MCH: 31.1 pg (ref 26.0–34.0)
MCHC: 33.2 g/dL (ref 30.0–36.0)
MCV: 93.6 fL (ref 80.0–100.0)
Monocytes Absolute: 0.5 10*3/uL (ref 0.1–1.0)
Monocytes Relative: 11 %
Neutro Abs: 2.3 10*3/uL (ref 1.7–7.7)
Neutrophils Relative %: 51 %
Platelet Count: 171 10*3/uL (ref 150–400)
RBC: 4.09 MIL/uL — ABNORMAL LOW (ref 4.22–5.81)
RDW: 13.8 % (ref 11.5–15.5)
WBC Count: 4.6 10*3/uL (ref 4.0–10.5)
nRBC: 0 % (ref 0.0–0.2)

## 2021-03-01 LAB — CMP (CANCER CENTER ONLY)
ALT: 17 U/L (ref 0–44)
AST: 11 U/L — ABNORMAL LOW (ref 15–41)
Albumin: 3.9 g/dL (ref 3.5–5.0)
Alkaline Phosphatase: 75 U/L (ref 38–126)
Anion gap: 6 (ref 5–15)
BUN: 24 mg/dL — ABNORMAL HIGH (ref 8–23)
CO2: 27 mmol/L (ref 22–32)
Calcium: 10.7 mg/dL — ABNORMAL HIGH (ref 8.9–10.3)
Chloride: 110 mmol/L (ref 98–111)
Creatinine: 1.01 mg/dL (ref 0.61–1.24)
GFR, Estimated: >60 mL/min (ref >60)
Glucose, Bld: 100 mg/dL — ABNORMAL HIGH (ref 70–99)
Potassium: 3.9 mmol/L (ref 3.5–5.1)
Sodium: 143 mmol/L (ref 135–145)
Total Bilirubin: 0.5 mg/dL (ref 0.3–1.2)
Total Protein: 5.8 g/dL — ABNORMAL LOW (ref 6.5–8.1)

## 2021-03-01 LAB — LACTATE DEHYDROGENASE: LDH: 157 U/L (ref 98–192)

## 2021-03-01 MED ORDER — SODIUM CHLORIDE 0.9 % IV SOLN
480.0000 mg | Freq: Once | INTRAVENOUS | Status: AC
  Administered 2021-03-01: 480 mg via INTRAVENOUS
  Filled 2021-03-01: qty 48

## 2021-03-01 MED ORDER — SODIUM CHLORIDE 0.9 % IV SOLN: Freq: Once | INTRAVENOUS | Status: AC

## 2021-03-01 NOTE — Telephone Encounter (Signed)
Per 03/01/21 los - gave upcoming appointments - confirmed °

## 2021-03-01 NOTE — Patient Instructions (Signed)
[facility] [place]  Discharge Instructions: Thank you for choosing [facility] to provide your oncology and hematology care.   If you have a lab appointment with the [facility], please go directly to the [facility] and check in at the registration area.  Wear comfortable clothing and clothing appropriate for easy access to any Portacath or PICC line.   We strive to give you quality time with your provider. You may need to reschedule your appointment if you arrive late (15 or more minutes).  Arriving late affects you and other patients whose appointments are after yours.  Also, if you miss three or more appointments without notifying the office, you may be dismissed from the clinic at the providers discretion.      For prescription refill requests, have your pharmacy contact our office and allow 72 hours for refills to be completed.    Today you received the following chemotherapy and/or immunotherapy agents: opdivo      To help prevent nausea and vomiting after your treatment, we encourage you to take your nausea medication as directed.  BELOW ARE SYMPTOMS THAT SHOULD BE REPORTED IMMEDIATELY: *FEVER GREATER THAN 100.4 F (38 C) OR HIGHER *CHILLS OR SWEATING *NAUSEA AND VOMITING THAT IS NOT CONTROLLED WITH YOUR NAUSEA MEDICATION *UNUSUAL SHORTNESS OF BREATH *UNUSUAL BRUISING OR BLEEDING *URINARY PROBLEMS (pain or burning when urinating, or frequent urination) *BOWEL PROBLEMS (unusual diarrhea, constipation, pain near the anus) TENDERNESS IN MOUTH AND THROAT WITH OR WITHOUT PRESENCE OF ULCERS (sore throat, sores in mouth, or a toothache) UNUSUAL RASH, SWELLING OR PAIN  UNUSUAL VAGINAL DISCHARGE OR ITCHING   Items with * indicate a potential emergency and should be followed up as soon as possible or go to the Emergency Department if any problems should occur.  Please show the CHEMOTHERAPY ALERT CARD or IMMUNOTHERAPY ALERT CARD at check-in to the  Emergency Department and triage nurse. Should you have questions after your visit or need to cancel or reschedule your appointment, please contact [facility]  [phone] and follow the prompts.  Office hours are 8:00 a.m. to 4:30 p.m. Monday - Friday. Please note that voicemails left after 4:00 p.m. may not be returned until the following business day.  We are closed weekends and major holidays. You have access to a nurse at all times for urgent questions. Please call the main number to the clinic [phone] and follow the prompts.  For any non-urgent questions, you may also contact your provider using MyChart. We now offer e-Visits for anyone 72 and older to request care online for non-urgent symptoms. For details visit [email or web address].   Also download the MyChart app! Go to the app store, search "MyChart", open the app, select [facility], and log in with your MyChart username and password.  Due to Covid, a mask is required upon entering the hospital/clinic. If you do not have a mask, one will be given to you upon arrival. For doctor visits, patients may have 1 support person aged 83 or older with them. For treatment visits, patients cannot have anyone with them due to current Covid guidelines and our immunocompromised population.   Nivolumab injection What is this medication? NIVOLUMAB (nye VOL ue mab) is a monoclonal antibody. It treats certain types of cancer. Some of the cancers treated are colon cancer, head and neck cancer,Hodgkin lymphoma, lung cancer, and melanoma. This medicine may be used for other purposes; ask your health care provider orpharmacist if  you have questions. COMMON BRAND NAME(S): Opdivo What should I tell my care team before I take this medication? They need to know if you have any of these conditions: autoimmune diseases like Crohn's disease, ulcerative colitis, or lupus have had or planning to have an allogeneic stem cell  transplant (uses someone else's stem cells) history of chest radiation history of organ transplant nervous system problems like myasthenia gravis or Guillain-Barre syndrome an unusual or allergic reaction to nivolumab, other medicines, foods, dyes, or preservatives pregnant or trying to get pregnant breast-feeding How should I use this medication? This medicine is for infusion into a vein. It is given by a health careprofessional in a hospital or clinic setting. A special MedGuide will be given to you before each treatment. Be sure to readthis information carefully each time. Talk to your pediatrician regarding the use of this medicine in children. While this drug may be prescribed for children as young as 12 years for selectedconditions, precautions do apply. Overdosage: If you think you have taken too much of this medicine contact apoison control center or emergency room at once. NOTE: This medicine is only for you. Do not share this medicine with others. What if I miss a dose? It is important not to miss your dose. Call your doctor or health careprofessional if you are unable to keep an appointment. What may interact with this medication? Interactions have not been studied. This list may not describe all possible interactions. Give your health care provider a list of all the medicines, herbs, non-prescription drugs, or dietary supplements you use. Also tell them if you smoke, drink alcohol, or use illegaldrugs. Some items may interact with your medicine. What should I watch for while using this medication? This drug may make you feel generally unwell. Continue your course of treatmenteven though you feel ill unless your doctor tells you to stop. You may need blood work done while you are taking this medicine. Do not become pregnant while taking this medicine or for 5 months after stopping it. Women should inform their doctor if they wish to become pregnant or think they might be pregnant.  There is a potential for serious side effects to an unborn child. Talk to your health care professional or pharmacist for more information. Do not breast-feed an infant while taking this medicine orfor 5 months after stopping it. What side effects may I notice from receiving this medication? Side effects that you should report to your doctor or health care professionalas soon as possible: allergic reactions like skin rash, itching or hives, swelling of the face, lips, or tongue breathing problems blood in the urine bloody or watery diarrhea or black, tarry stools changes in emotions or moods changes in vision chest pain cough dizziness feeling faint or lightheaded, falls fever, chills headache with fever, neck stiffness, confusion, loss of memory, sensitivity to light, hallucination, loss of contact with reality, or seizures joint pain mouth sores redness, blistering, peeling or loosening of the skin, including inside the mouth severe muscle pain or weakness signs and symptoms of high blood sugar such as dizziness; dry mouth; dry skin; fruity breath; nausea; stomach pain; increased hunger or thirst; increased urination signs and symptoms of kidney injury like trouble passing urine or change in the amount of urine signs and symptoms of liver injury like dark yellow or brown urine; general ill feeling or flu-like symptoms; light-colored stools; loss of appetite; nausea; right upper belly pain; unusually weak or tired; yellowing of the eyes or skin swelling  of the ankles, feet, hands trouble passing urine or change in the amount of urine unusually weak or tired weight gain or loss Side effects that usually do not require medical attention (report to yourdoctor or health care professional if they continue or are bothersome): bone pain constipation decreased appetite diarrhea muscle pain nausea, vomiting tiredness This list may not describe all possible side effects. Call your doctor for  medical advice about side effects. You may report side effects to FDA at1-800-FDA-1088. Where should I keep my medication? This drug is given in a hospital or clinic and will not be stored at home. NOTE: This sheet is a summary. It may not cover all possible information. If you have questions about this medicine, talk to your doctor, pharmacist, orhealth care provider.  2022 Elsevier/Gold Standard (2019-05-20 10:08:25)

## 2021-03-01 NOTE — Progress Notes (Signed)
Hematology and Oncology Follow Up Visit  Francis Cunningham [id number] [birth year] 71 y.o. 03/01/2021   Principle Diagnosis:  Metastatic renal cell carcinoma-bone only metastasis  Current Therapy:   Maintenance nivolumab-Q 4-week dosing     Interim History:  Francis Cunningham is back for follow-up.  He feels good.  He has had no problems since we last saw him.  He is eating well.  He is having no problems with nausea or vomiting.  He has had no problems with cough or shortness of breath.  His blood pressure is a little on the low side.  Yours was low.  However, he took some Inderal yesterday and today which he takes for some anxiety.  I suspect this probably is the reason since his pulse is only 63.  He is really not dizzy.  He has no problems with dizziness or vision changes.  He has had no pain issues.  There is still a little bit of difficulty with his right shoulder.  However, we will just follow this along for right now.  He has had no problems with rashes.  He has had no change in bowel or bladder habits.  Overall, his performance status is ECOG 1.    Medications:  Current Outpatient Medications:    alfuzosin (UROXATRAL) 10 MG 24 hr tablet, Take 10 mg by mouth at bedtime., Disp: , Rfl:    ascorbic acid (VITAMIN C) 500 MG tablet, Take 500 mg by mouth in the morning., Disp: , Rfl:    Cholecalciferol 25 MCG (1000 UT) tablet, Take 25 Units by mouth daily., Disp: , Rfl:    escitalopram (LEXAPRO) 20 MG tablet, Take 20 mg by mouth daily., Disp: , Rfl:    finasteride (PROSCAR) 5 MG tablet, Take 5 mg by mouth daily., Disp: , Rfl:    guaiFENesin (MUCINEX) 600 MG 12 hr tablet, Take 600 mg by mouth in the morning and at bedtime., Disp: , Rfl:    Hypertonic Nasal Wash (SINUS RINSE) PACK, Irrigate with as directed., Disp: , Rfl:    LORazepam (ATIVAN) 1 MG tablet, Take 1 mg by mouth as needed. Ona tablest as needed for procedures and imaging, Disp: , Rfl:    Magnesium Carbonate POWD, Take 325 mg by mouth  daily. 325 mg/2 teaspoons, Disp: , Rfl:    melatonin 3 MG TABS tablet, Take 3 mg by mouth at bedtime., Disp: , Rfl:    midodrine (PROAMATINE) 2.5 MG tablet, Take 1 tablet (2.5 mg total) by mouth 3 (three) times daily., Disp: 90 tablet, Rfl: 8   montelukast (SINGULAIR) 10 MG tablet, Take 1 tablet by mouth daily., Disp: , Rfl:    Multiple Vitamin (MULTI-VITAMINS) TABS, Take 1 tablet by mouth daily., Disp: , Rfl:    nivolumab (OPDIVO) 100 MG/10ML SOLN chemo injection, Inject into the vein., Disp: , Rfl:    Omega-3 Fatty Acids (FISH OIL PO), Take 5 mLs by mouth daily., Disp: , Rfl:    polyethylene glycol powder (GLYCOLAX/MIRALAX) 17 GM/SCOOP powder, Take 17 g by mouth at bedtime., Disp: , Rfl:    Potassium Citrate-Citric Acid 3300-1002 MG PACK, Take by mouth daily., Disp: , Rfl:    predniSONE (DELTASONE) 10 MG tablet, Take 1 tablet (10 mg total) by mouth daily., Disp: 90 tablet, Rfl: 6   promethazine (PHENERGAN) 12.5 MG tablet, Take 1 tablet (12.5 mg total) by mouth every 6 (six) hours as needed for nausea or vomiting., Disp: 20 tablet, Rfl: 0   propranolol (INDERAL) 10 MG tablet, Take 10  mg by mouth daily as needed. For anxiety, Disp: , Rfl:    vitamin E 180 MG (400 UNITS) capsule, Take 400 Units by mouth every morning., Disp: , Rfl:   Allergies:  Allergies  Allergen Reactions   Morphine Nausea Only    Past Medical History, Surgical history, Social history, and Family History were reviewed and updated.  Review of Systems: Review of Systems  Constitutional: Negative.   HENT:   Positive for mouth sores.   Eyes: Negative.   Respiratory: Negative.    Cardiovascular: Negative.   Gastrointestinal: Negative.   Endocrine: Negative.   Genitourinary: Negative.    Musculoskeletal:  Positive for arthralgias and back pain.  Skin: Negative.   Neurological: Negative.   Hematological: Negative.   Psychiatric/Behavioral: Negative.     Physical Exam:  weight is 240 lb (108.9 kg). His oral  temperature is 97.9 F (36.6 C). His blood pressure is 75/53 (abnormal) and his pulse is 63. His respiration is 18 and oxygen saturation is 98%.   Wt Readings from Last 3 Encounters:  03/01/21 240 lb (108.9 kg)  01/31/21 238 lb (108 kg)  01/03/21 236 lb 4 oz (107.2 kg)    Physical Exam Vitals reviewed.  HENT:     Head: Normocephalic and atraumatic.  Eyes:     Pupils: Pupils are equal, round, and reactive to light.  Cardiovascular:     Rate and Rhythm: Normal rate and regular rhythm.     Heart sounds: Normal heart sounds.  Pulmonary:     Effort: Pulmonary effort is normal.     Breath sounds: Normal breath sounds.  Abdominal:     General: Bowel sounds are normal.     Palpations: Abdomen is soft.  Musculoskeletal:        General: No tenderness or deformity. Normal range of motion.     Cervical back: Normal range of motion.  Lymphadenopathy:     Cervical: No cervical adenopathy.  Skin:    General: Skin is warm and dry.     Findings: No erythema or rash.  Neurological:     Mental Status: He is alert and oriented to person, place, and time.  Psychiatric:        Behavior: Behavior normal.        Thought Content: Thought content normal.        Judgment: Judgment normal.     Lab Results  Component Value Date   WBC 4.6 03/01/2021   HGB 12.7 (L) 03/01/2021   HCT 38.3 (L) 03/01/2021   MCV 93.6 03/01/2021   PLT 171 03/01/2021     Chemistry      Component Value Date/Time   NA 140 01/31/2021 1315   K 3.9 01/31/2021 1315   CL 109 01/31/2021 1315   CO2 25 01/31/2021 1315   BUN 26 (H) 01/31/2021 1315   CREATININE 0.99 01/31/2021 1315      Component Value Date/Time   CALCIUM 10.3 01/31/2021 1315   ALKPHOS 77 01/31/2021 1315   AST 11 (L) 01/31/2021 1315   ALT 15 01/31/2021 1315   BILITOT 0.6 01/31/2021 1315      Impression and Plan: Francis Cunningham is a very nice 71 year old white male.  He has metastatic renal cell carcinoma.  Thankfully, he is responding pretty well to  immunotherapy.  I would have to say he is in remission.  I am very happy to see this.  I do not think we need another PET scan probably until March.  We will continue  him on the monthly nivolumab.  I think this is working very well.  I am not too worried about his blood pressure.  Again, I told him to be careful taking the Inderal.  We will plan to get him back in another month.  Afterwards, we will do another PET scan on him.     Volanda Napoleon, MD 2/1/20239:24 AM

## 2021-03-29 ENCOUNTER — Inpatient Hospital Stay: Payer: Medicare Other

## 2021-03-29 ENCOUNTER — Encounter: Payer: Self-pay | Admitting: Hematology & Oncology

## 2021-03-29 ENCOUNTER — Other Ambulatory Visit: Payer: Self-pay

## 2021-03-29 ENCOUNTER — Inpatient Hospital Stay (HOSPITAL_BASED_OUTPATIENT_CLINIC_OR_DEPARTMENT_OTHER): Payer: Medicare Other | Admitting: Hematology & Oncology

## 2021-03-29 ENCOUNTER — Inpatient Hospital Stay: Payer: Medicare Other | Attending: Hematology & Oncology

## 2021-03-29 VITALS — BP 108/59 | HR 67 | Temp 97.8°F | Resp 20

## 2021-03-29 VITALS — BP 100/97 | HR 96 | Temp 98.5°F | Resp 18 | Wt 241.1 lb

## 2021-03-29 DIAGNOSIS — C641 Malignant neoplasm of right kidney, except renal pelvis: Secondary | ICD-10-CM

## 2021-03-29 DIAGNOSIS — Z7952 Long term (current) use of systemic steroids: Secondary | ICD-10-CM | POA: Insufficient documentation

## 2021-03-29 DIAGNOSIS — M549 Dorsalgia, unspecified: Secondary | ICD-10-CM | POA: Insufficient documentation

## 2021-03-29 DIAGNOSIS — Z923 Personal history of irradiation: Secondary | ICD-10-CM | POA: Insufficient documentation

## 2021-03-29 DIAGNOSIS — Z5112 Encounter for antineoplastic immunotherapy: Secondary | ICD-10-CM | POA: Insufficient documentation

## 2021-03-29 DIAGNOSIS — C649 Malignant neoplasm of unspecified kidney, except renal pelvis: Secondary | ICD-10-CM | POA: Diagnosis present

## 2021-03-29 DIAGNOSIS — Z87891 Personal history of nicotine dependence: Secondary | ICD-10-CM | POA: Diagnosis not present

## 2021-03-29 DIAGNOSIS — C7951 Secondary malignant neoplasm of bone: Secondary | ICD-10-CM | POA: Diagnosis present

## 2021-03-29 LAB — CMP (CANCER CENTER ONLY)
ALT: 15 U/L (ref 0–44)
AST: 10 U/L — ABNORMAL LOW (ref 15–41)
Albumin: 3.6 g/dL (ref 3.5–5.0)
Alkaline Phosphatase: 76 U/L (ref 38–126)
Anion gap: 5 (ref 5–15)
BUN: 25 mg/dL — ABNORMAL HIGH (ref 8–23)
CO2: 29 mmol/L (ref 22–32)
Calcium: 9.9 mg/dL (ref 8.9–10.3)
Chloride: 108 mmol/L (ref 98–111)
Creatinine: 0.94 mg/dL (ref 0.61–1.24)
GFR, Estimated: >60 mL/min (ref >60)
Glucose, Bld: 114 mg/dL — ABNORMAL HIGH (ref 70–99)
Potassium: 3.6 mmol/L (ref 3.5–5.1)
Sodium: 142 mmol/L (ref 135–145)
Total Bilirubin: 0.6 mg/dL (ref 0.3–1.2)
Total Protein: 6 g/dL — ABNORMAL LOW (ref 6.5–8.1)

## 2021-03-29 LAB — CBC WITH DIFFERENTIAL (CANCER CENTER ONLY)
Abs Immature Granulocytes: 0.01 10*3/uL (ref 0.00–0.07)
Basophils Absolute: 0 10*3/uL (ref 0.0–0.1)
Basophils Relative: 1 %
Eosinophils Absolute: 0.1 10*3/uL (ref 0.0–0.5)
Eosinophils Relative: 2 %
HCT: 39 % (ref 39.0–52.0)
Hemoglobin: 12.8 g/dL — ABNORMAL LOW (ref 13.0–17.0)
Immature Granulocytes: 0 %
Lymphocytes Relative: 38 %
Lymphs Abs: 1.6 10*3/uL (ref 0.7–4.0)
MCH: 30.8 pg (ref 26.0–34.0)
MCHC: 32.8 g/dL (ref 30.0–36.0)
MCV: 94 fL (ref 80.0–100.0)
Monocytes Absolute: 0.6 10*3/uL (ref 0.1–1.0)
Monocytes Relative: 13 %
Neutro Abs: 2 10*3/uL (ref 1.7–7.7)
Neutrophils Relative %: 46 %
Platelet Count: 174 10*3/uL (ref 150–400)
RBC: 4.15 MIL/uL — ABNORMAL LOW (ref 4.22–5.81)
RDW: 13.8 % (ref 11.5–15.5)
Smear Review: NORMAL
WBC Count: 4.2 10*3/uL (ref 4.0–10.5)
nRBC: 0 % (ref 0.0–0.2)

## 2021-03-29 LAB — LACTATE DEHYDROGENASE: LDH: 150 U/L (ref 98–192)

## 2021-03-29 MED ORDER — SODIUM CHLORIDE 0.9 % IV SOLN: Freq: Once | INTRAVENOUS | Status: AC

## 2021-03-29 MED ORDER — SODIUM CHLORIDE 0.9 % IV SOLN
480.0000 mg | Freq: Once | INTRAVENOUS | Status: AC
  Administered 2021-03-29: 480 mg via INTRAVENOUS
  Filled 2021-03-29: qty 48

## 2021-03-29 NOTE — Progress Notes (Signed)
?Hematology and Oncology Follow Up Visit ? ?Francis Cunningham ?[id number] ?May 15, 1950 71 y.o. ?03/29/2021 ? ? ?Principle Diagnosis:  ?Metastatic renal cell carcinoma-bone only metastasis ? ?Current Therapy:   ?Maintenance nivolumab-Q 4-week dosing ?    ?Interim History:  Francis Cunningham is back for follow-up.  So far, everything is going okay with him.  Unfortunate, his wife has little bit of a cold so she did not make it in. ? ?He did have a little bit of a fall at where he lives.  Thankfully, he did not break anything.  He has some x-rays taken and again nothing was broken. ? ?He is done well with his immunotherapy.  He has had no problems with his cough or shortness of breath.  He has had no nausea or vomiting.  He has had no rashes.  There has been no leg swelling.  He has had no headache. ? ?Overall, I would say his performance status is ECOG 1.   ? ?Medications:  ?Current Outpatient Medications:  ?  alfuzosin (UROXATRAL) 10 MG 24 hr tablet, Take 10 mg by mouth at bedtime., Disp: , Rfl:  ?  ascorbic acid (VITAMIN C) 500 MG tablet, Take 500 mg by mouth in the morning., Disp: , Rfl:  ?  Cholecalciferol 25 MCG (1000 UT) tablet, Take 25 Units by mouth daily., Disp: , Rfl:  ?  escitalopram (LEXAPRO) 20 MG tablet, Take 20 mg by mouth daily., Disp: , Rfl:  ?  finasteride (PROSCAR) 5 MG tablet, Take 5 mg by mouth daily., Disp: , Rfl:  ?  guaiFENesin (MUCINEX) 600 MG 12 hr tablet, Take 600 mg by mouth in the morning and at bedtime., Disp: , Rfl:  ?  Hypertonic Nasal Wash (SINUS RINSE) PACK, Irrigate with as directed., Disp: , Rfl:  ?  LORazepam (ATIVAN) 1 MG tablet, Take 1 mg by mouth as needed. Ona tablest as needed for procedures and imaging, Disp: , Rfl:  ?  Magnesium Carbonate POWD, Take 325 mg by mouth daily. 325 mg/2 teaspoons, Disp: , Rfl:  ?  melatonin 3 MG TABS tablet, Take 3 mg by mouth at bedtime., Disp: , Rfl:  ?  midodrine (PROAMATINE) 2.5 MG tablet, Take 1 tablet (2.5 mg total) by mouth 3 (three) times daily., Disp: 90  tablet, Rfl: 8 ?  montelukast (SINGULAIR) 10 MG tablet, Take 1 tablet by mouth daily., Disp: , Rfl:  ?  Multiple Vitamin (MULTI-VITAMINS) TABS, Take 1 tablet by mouth daily., Disp: , Rfl:  ?  nivolumab (OPDIVO) 100 MG/10ML SOLN chemo injection, Inject into the vein., Disp: , Rfl:  ?  Omega-3 Fatty Acids (FISH OIL PO), Take 5 mLs by mouth daily., Disp: , Rfl:  ?  polyethylene glycol powder (GLYCOLAX/MIRALAX) 17 GM/SCOOP powder, Take 17 g by mouth at bedtime., Disp: , Rfl:  ?  Potassium Citrate-Citric Acid 3300-1002 MG PACK, Take by mouth daily., Disp: , Rfl:  ?  predniSONE (DELTASONE) 10 MG tablet, Take 1 tablet (10 mg total) by mouth daily., Disp: 90 tablet, Rfl: 6 ?  promethazine (PHENERGAN) 12.5 MG tablet, Take 1 tablet (12.5 mg total) by mouth every 6 (six) hours as needed for nausea or vomiting., Disp: 20 tablet, Rfl: 0 ?  propranolol (INDERAL) 10 MG tablet, Take 10 mg by mouth daily as needed. For anxiety, Disp: , Rfl:  ?  vitamin E 180 MG (400 UNITS) capsule, Take 400 Units by mouth every morning., Disp: , Rfl:  ? ?Allergies:  ?Allergies  ?Allergen Reactions  ? Morphine Nausea  Only  ? ? ?Past Medical History, Surgical history, Social history, and Family History were reviewed and updated. ? ?Review of Systems: ?Review of Systems  ?Constitutional: Negative.   ?HENT:   Positive for mouth sores.   ?Eyes: Negative.   ?Respiratory: Negative.    ?Cardiovascular: Negative.   ?Gastrointestinal: Negative.   ?Endocrine: Negative.   ?Genitourinary: Negative.    ?Musculoskeletal:  Positive for arthralgias and back pain.  ?Skin: Negative.   ?Neurological: Negative.   ?Hematological: Negative.   ?Psychiatric/Behavioral: Negative.    ? ?Physical Exam: ? weight is 241 lb 1.9 oz (109.4 kg). His oral temperature is 98.5 ?F (36.9 ?C). His blood pressure is 100/97 (abnormal) and his pulse is 96. His respiration is 18 and oxygen saturation is 98%.  ? ?Wt Readings from Last 3 Encounters:  ?03/29/21 241 lb 1.9 oz (109.4 kg)   ?03/01/21 240 lb (108.9 kg)  ?01/31/21 238 lb (108 kg)  ? ? ?Physical Exam ?Vitals reviewed.  ?HENT:  ?   Head: Normocephalic and atraumatic.  ?Eyes:  ?   Pupils: Pupils are equal, round, and reactive to light.  ?Cardiovascular:  ?   Rate and Rhythm: Normal rate and regular rhythm.  ?   Heart sounds: Normal heart sounds.  ?Pulmonary:  ?   Effort: Pulmonary effort is normal.  ?   Breath sounds: Normal breath sounds.  ?Abdominal:  ?   General: Bowel sounds are normal.  ?   Palpations: Abdomen is soft.  ?Musculoskeletal:     ?   General: No tenderness or deformity. Normal range of motion.  ?   Cervical back: Normal range of motion.  ?Lymphadenopathy:  ?   Cervical: No cervical adenopathy.  ?Skin: ?   General: Skin is warm and dry.  ?   Findings: No erythema or rash.  ?Neurological:  ?   Mental Status: He is alert and oriented to person, place, and time.  ?Psychiatric:     ?   Behavior: Behavior normal.     ?   Thought Content: Thought content normal.     ?   Judgment: Judgment normal.  ? ? ? ?Lab Results  ?Component Value Date  ? WBC 4.2 03/29/2021  ? HGB 12.8 (L) 03/29/2021  ? HCT 39.0 03/29/2021  ? MCV 94.0 03/29/2021  ? PLT 174 03/29/2021  ? ?  Chemistry   ?   ?Component Value Date/Time  ? NA 142 03/29/2021 0805  ? K 3.6 03/29/2021 0805  ? CL 108 03/29/2021 0805  ? CO2 29 03/29/2021 0805  ? BUN 25 (H) 03/29/2021 0805  ? CREATININE 0.94 03/29/2021 0805  ?    ?Component Value Date/Time  ? CALCIUM 9.9 03/29/2021 0805  ? ALKPHOS 76 03/29/2021 0805  ? AST 10 (L) 03/29/2021 0805  ? ALT 15 03/29/2021 0805  ? BILITOT 0.6 03/29/2021 0805  ?  ? ? ?Impression and Plan: ?Francis Cunningham is a very nice 71 year old white male.  He has metastatic renal cell carcinoma.  Thankfully, he is responding pretty well to immunotherapy.  I would have to say he is in remission.  I am very happy to see this. ? ?We will do another cycle of treatment and then we will do a PET scan in April. ? ?We will plan to get him back in 4 weeks. ? ?  ? ?Volanda Napoleon, MD ?3/1/20238:58 AM ?

## 2021-03-29 NOTE — Patient Instructions (Signed)
[facility]  Discharge Instructions: Thank you for choosing [facility] to provide your oncology and hematology care.   If you have a lab appointment with the Cancer Center, please go directly to the Cancer Center and check in at the registration area.  Wear comfortable clothing and clothing appropriate for easy access to any Portacath or PICC line.   We strive to give you quality time with your provider. You may need to reschedule your appointment if you arrive late (15 or more minutes).  Arriving late affects you and other patients whose appointments are after yours.  Also, if you miss three or more appointments without notifying the office, you may be dismissed from the clinic at the provider's discretion.      For prescription refill requests, have your pharmacy contact our office and allow 72 hours for refills to be completed.    Today you received the following chemotherapy and/or immunotherapy agents:  Opdivo      To help prevent nausea and vomiting after your treatment, we encourage you to take your nausea medication as directed.  BELOW ARE SYMPTOMS THAT SHOULD BE REPORTED IMMEDIATELY: *FEVER GREATER THAN 100.4 F (38 C) OR HIGHER *CHILLS OR SWEATING *NAUSEA AND VOMITING THAT IS NOT CONTROLLED WITH YOUR NAUSEA MEDICATION *UNUSUAL SHORTNESS OF BREATH *UNUSUAL BRUISING OR BLEEDING *URINARY PROBLEMS (pain or burning when urinating, or frequent urination) *BOWEL PROBLEMS (unusual diarrhea, constipation, pain near the anus) TENDERNESS IN MOUTH AND THROAT WITH OR WITHOUT PRESENCE OF ULCERS (sore throat, sores in mouth, or a toothache) UNUSUAL RASH, SWELLING OR PAIN  UNUSUAL VAGINAL DISCHARGE OR ITCHING   Items with * indicate a potential emergency and should be followed up as soon as possible or go to the Emergency Department if any problems should occur.  Please show the CHEMOTHERAPY ALERT CARD or IMMUNOTHERAPY ALERT CARD at check-in to the  Emergency Department and triage nurse. Should you have questions after your visit or need to cancel or reschedule your appointment, please contact [facility]  [phone] and follow the prompts.  Office hours are 8:00 a.m. to 4:30 p.m. Monday - Friday. Please note that voicemails left after 4:00 p.m. may not be returned until the following business day.  We are closed weekends and major holidays. You have access to a nurse at all times for urgent questions. Please call the main number to the clinic [phone] and follow the prompts.  For any non-urgent questions, you may also contact your provider using MyChart. We now offer e-Visits for anyone 18 and older to request care online for non-urgent symptoms. For details visit [email or web address].   Also download the MyChart app! Go to the app store, search "MyChart", open the app, select [facility], and log in with your MyChart username and password.  Due to Covid, a mask is required upon entering the hospital/clinic. If you do not have a mask, one will be given to you upon arrival. For doctor visits, patients may have 1 support person aged 18 or older with them. For treatment visits, patients cannot have anyone with them due to current Covid guidelines and our immunocompromised population.  

## 2021-04-26 ENCOUNTER — Inpatient Hospital Stay: Payer: Medicare Other

## 2021-04-26 ENCOUNTER — Encounter: Payer: Self-pay | Admitting: Hematology & Oncology

## 2021-04-26 ENCOUNTER — Other Ambulatory Visit: Payer: Self-pay

## 2021-04-26 ENCOUNTER — Inpatient Hospital Stay (HOSPITAL_BASED_OUTPATIENT_CLINIC_OR_DEPARTMENT_OTHER): Payer: Medicare Other | Admitting: Hematology & Oncology

## 2021-04-26 VITALS — BP 104/62 | HR 79 | Temp 98.4°F | Resp 17 | Wt 240.0 lb

## 2021-04-26 DIAGNOSIS — C641 Malignant neoplasm of right kidney, except renal pelvis: Secondary | ICD-10-CM | POA: Diagnosis not present

## 2021-04-26 DIAGNOSIS — Z5112 Encounter for antineoplastic immunotherapy: Secondary | ICD-10-CM | POA: Diagnosis not present

## 2021-04-26 LAB — CBC WITH DIFFERENTIAL (CANCER CENTER ONLY)
Abs Immature Granulocytes: 0.01 10*3/uL (ref 0.00–0.07)
Basophils Absolute: 0 10*3/uL (ref 0.0–0.1)
Basophils Relative: 1 %
Eosinophils Absolute: 0.1 10*3/uL (ref 0.0–0.5)
Eosinophils Relative: 1 %
HCT: 38.7 % — ABNORMAL LOW (ref 39.0–52.0)
Hemoglobin: 13 g/dL (ref 13.0–17.0)
Immature Granulocytes: 0 %
Lymphocytes Relative: 24 %
Lymphs Abs: 1.2 10*3/uL (ref 0.7–4.0)
MCH: 31.7 pg (ref 26.0–34.0)
MCHC: 33.6 g/dL (ref 30.0–36.0)
MCV: 94.4 fL (ref 80.0–100.0)
Monocytes Absolute: 0.5 10*3/uL (ref 0.1–1.0)
Monocytes Relative: 10 %
Neutro Abs: 3.3 10*3/uL (ref 1.7–7.7)
Neutrophils Relative %: 64 %
Platelet Count: 174 10*3/uL (ref 150–400)
RBC: 4.1 MIL/uL — ABNORMAL LOW (ref 4.22–5.81)
RDW: 13.6 % (ref 11.5–15.5)
WBC Count: 5.1 10*3/uL (ref 4.0–10.5)
nRBC: 0 % (ref 0.0–0.2)

## 2021-04-26 LAB — CMP (CANCER CENTER ONLY)
ALT: 16 U/L (ref 0–44)
AST: 11 U/L — ABNORMAL LOW (ref 15–41)
Albumin: 3.9 g/dL (ref 3.5–5.0)
Alkaline Phosphatase: 83 U/L (ref 38–126)
Anion gap: 7 (ref 5–15)
BUN: 26 mg/dL — ABNORMAL HIGH (ref 8–23)
CO2: 25 mmol/L (ref 22–32)
Calcium: 10.4 mg/dL — ABNORMAL HIGH (ref 8.9–10.3)
Chloride: 108 mmol/L (ref 98–111)
Creatinine: 0.96 mg/dL (ref 0.61–1.24)
GFR, Estimated: >60 mL/min (ref >60)
Glucose, Bld: 112 mg/dL — ABNORMAL HIGH (ref 70–99)
Potassium: 3.9 mmol/L (ref 3.5–5.1)
Sodium: 140 mmol/L (ref 135–145)
Total Bilirubin: 0.5 mg/dL (ref 0.3–1.2)
Total Protein: 6.1 g/dL — ABNORMAL LOW (ref 6.5–8.1)

## 2021-04-26 LAB — LACTATE DEHYDROGENASE: LDH: 139 U/L (ref 98–192)

## 2021-04-26 MED ORDER — SODIUM CHLORIDE 0.9 % IV SOLN
480.0000 mg | Freq: Once | INTRAVENOUS | Status: AC
  Administered 2021-04-26: 480 mg via INTRAVENOUS
  Filled 2021-04-26: qty 48

## 2021-04-26 MED ORDER — SODIUM CHLORIDE 0.9 % IV SOLN: Freq: Once | INTRAVENOUS | Status: AC

## 2021-04-26 NOTE — Patient Instructions (Signed)
[facility]  Discharge Instructions: ?Thank you for choosing [facility] to provide your oncology and hematology care.  ? ?If you have a lab appointment with the [facility], please go directly to the [facility] and check in at the registration area. ? ?Wear comfortable clothing and clothing appropriate for easy access to any Portacath or PICC line.  ? ?We strive to give you quality time with your provider. You may need to reschedule your appointment if you arrive late (15 or more minutes).  Arriving late affects you and other patients whose appointments are after yours.  Also, if you miss three or more appointments without notifying the office, you may be dismissed from the clinic at the provider?s discretion.    ?  ?For prescription refill requests, have your pharmacy contact our office and allow 72 hours for refills to be completed.   ? ?Today you received the following chemotherapy and/or immunotherapy agents: opdivo    ?  ?To help prevent nausea and vomiting after your treatment, we encourage you to take your nausea medication as directed. ? ?BELOW ARE SYMPTOMS THAT SHOULD BE REPORTED IMMEDIATELY: ?*FEVER GREATER THAN 100.4 F (38 ?C) OR HIGHER ?*CHILLS OR SWEATING ?*NAUSEA AND VOMITING THAT IS NOT CONTROLLED WITH YOUR NAUSEA MEDICATION ?*UNUSUAL SHORTNESS OF BREATH ?*UNUSUAL BRUISING OR BLEEDING ?*URINARY PROBLEMS (pain or burning when urinating, or frequent urination) ?*BOWEL PROBLEMS (unusual diarrhea, constipation, pain near the anus) ?TENDERNESS IN MOUTH AND THROAT WITH OR WITHOUT PRESENCE OF ULCERS (sore throat, sores in mouth, or a toothache) ?UNUSUAL RASH, SWELLING OR PAIN  ?UNUSUAL VAGINAL DISCHARGE OR ITCHING  ? ?Items with * indicate a potential emergency and should be followed up as soon as possible or go to the Emergency Department if any problems should occur. ? ?Please show the CHEMOTHERAPY ALERT CARD or IMMUNOTHERAPY ALERT CARD at check-in to the  Emergency Department and triage nurse. ?Should you have questions after your visit or need to cancel or reschedule your appointment, please contact [facility]  [phone] and follow the prompts.  Office hours are 8:00 a.m. to 4:30 p.m. Monday - Friday. Please note that voicemails left after 4:00 p.m. may not be returned until the following business day.  We are closed weekends and major holidays. You have access to a nurse at all times for urgent questions. Please call the main number to the clinic [phone] and follow the prompts. ? ?For any non-urgent questions, you may also contact your provider using MyChart. We now offer e-Visits for anyone 45 and older to request care online for non-urgent symptoms. For details visit [email or web address]. ?  ?Also download the MyChart app! Go to the app store, search "MyChart", open the app, select [facility], and log in with your MyChart username and password. ? ?Due to Covid, a mask is required upon entering the hospital/clinic. If you do not have a mask, one will be given to you upon arrival. For doctor visits, patients may have 1 support person aged 32 or older with them. For treatment visits, patients cannot have anyone with them due to current Covid guidelines and our immunocompromised population.  ? ?Nivolumab injection ?What is this medication? ?NIVOLUMAB (nye VOL ue mab) is a monoclonal antibody. It treats certain types of cancer. Some of the cancers treated are colon cancer, head and neck cancer,Hodgkin lymphoma, lung cancer, and melanoma. ?This medicine may be used for other purposes; ask your health care provider orpharmacist if  you have questions. ?COMMON BRAND NAME(S): Opdivo ?What should I tell my care team before I take this medication? ?They need to know if you have any of these conditions: ?autoimmune diseases like Crohn's disease, ulcerative colitis, or lupus ?have had or planning to have an allogeneic stem cell  transplant (uses someone else's stem cells) ?history of chest radiation ?history of organ transplant ?nervous system problems like myasthenia gravis or Guillain-Barre syndrome ?an unusual or allergic reaction to nivolumab, other medicines, foods, dyes, or preservatives ?pregnant or trying to get pregnant ?breast-feeding ?How should I use this medication? ?This medicine is for infusion into a vein. It is given by a health careprofessional in a hospital or clinic setting. ?A special MedGuide will be given to you before each treatment. Be sure to readthis information carefully each time. ?Talk to your pediatrician regarding the use of this medicine in children. While this drug may be prescribed for children as young as 12 years for selectedconditions, precautions do apply. ?Overdosage: If you think you have taken too much of this medicine contact apoison control center or emergency room at once. ?NOTE: This medicine is only for you. Do not share this medicine with others. ?What if I miss a dose? ?It is important not to miss your dose. Call your doctor or health careprofessional if you are unable to keep an appointment. ?What may interact with this medication? ?Interactions have not been studied. ?This list may not describe all possible interactions. Give your health care provider a list of all the medicines, herbs, non-prescription drugs, or dietary supplements you use. Also tell them if you smoke, drink alcohol, or use illegaldrugs. Some items may interact with your medicine. ?What should I watch for while using this medication? ?This drug may make you feel generally unwell. Continue your course of treatmenteven though you feel ill unless your doctor tells you to stop. ?You may need blood work done while you are taking this medicine. ?Do not become pregnant while taking this medicine or for 5 months after stopping it. Women should inform their doctor if they wish to become pregnant or think they might be pregnant.  There is a potential for serious side effects to an unborn child. Talk to your health care professional or pharmacist for more information. Do not breast-feed an infant while taking this medicine orfor 5 months after stopping it. ?What side effects may I notice from receiving this medication? ?Side effects that you should report to your doctor or health care professionalas soon as possible: ?allergic reactions like skin rash, itching or hives, swelling of the face, lips, or tongue ?breathing problems ?blood in the urine ?bloody or watery diarrhea or black, tarry stools ?changes in emotions or moods ?changes in vision ?chest pain ?cough ?dizziness ?feeling faint or lightheaded, falls ?fever, chills ?headache with fever, neck stiffness, confusion, loss of memory, sensitivity to light, hallucination, loss of contact with reality, or seizures ?joint pain ?mouth sores ?redness, blistering, peeling or loosening of the skin, including inside the mouth ?severe muscle pain or weakness ?signs and symptoms of high blood sugar such as dizziness; dry mouth; dry skin; fruity breath; nausea; stomach pain; increased hunger or thirst; increased urination ?signs and symptoms of kidney injury like trouble passing urine or change in the amount of urine ?signs and symptoms of liver injury like dark yellow or brown urine; general ill feeling or flu-like symptoms; light-colored stools; loss of appetite; nausea; right upper belly pain; unusually weak or tired; yellowing of the eyes or skin ?swelling  of the ankles, feet, hands ?trouble passing urine or change in the amount of urine ?unusually weak or tired ?weight gain or loss ?Side effects that usually do not require medical attention (report to yourdoctor or health care professional if they continue or are bothersome): ?bone pain ?constipation ?decreased appetite ?diarrhea ?muscle pain ?nausea, vomiting ?tiredness ?This list may not describe all possible side effects. Call your doctor for  medical advice about side effects. You may report side effects to FDA at1-800-FDA-1088. ?Where should I keep my medication? ?This drug is given in a hospital or clinic and will not be stored at home. ?NOTE: This sh

## 2021-04-26 NOTE — Progress Notes (Signed)
?Hematology and Oncology Follow Up Visit ? ?Francis Cunningham ?[id number] ?Apr 16, 1950 71 y.o. ?04/26/2021 ? ? ?Principle Diagnosis:  ?Metastatic renal cell carcinoma-bone only metastasis ? ?Current Therapy:   ?Maintenance nivolumab-Q 4-week dosing ?    ?Interim History:  Francis Cunningham is back for follow-up.  The only problem that he has had is that he fell a few weeks ago.  He had a hairline fracture in the left wrist.  He did not require any kind of surgery or any type of immobilization. ? ?He is feeling well otherwise.  He has had no problems with cough or shortness of breath.  He has had no nausea or vomiting.  There is been no change in bowel or bladder habits.  He has had no rashes.  There is been no bleeding.  He has had no headache. ? ?Overall, his performance status is ECOG 1.   ? ?Medications:  ?Current Outpatient Medications:  ?  alfuzosin (UROXATRAL) 10 MG 24 hr tablet, Take 10 mg by mouth at bedtime., Disp: , Rfl:  ?  ascorbic acid (VITAMIN C) 500 MG tablet, Take 500 mg by mouth in the morning., Disp: , Rfl:  ?  Cholecalciferol 25 MCG (1000 UT) tablet, Take 25 Units by mouth daily., Disp: , Rfl:  ?  escitalopram (LEXAPRO) 20 MG tablet, Take 20 mg by mouth daily., Disp: , Rfl:  ?  finasteride (PROSCAR) 5 MG tablet, Take 5 mg by mouth daily., Disp: , Rfl:  ?  guaiFENesin (MUCINEX) 600 MG 12 hr tablet, Take 600 mg by mouth in the morning and at bedtime., Disp: , Rfl:  ?  Hypertonic Nasal Wash (SINUS RINSE) PACK, Irrigate with as directed., Disp: , Rfl:  ?  LORazepam (ATIVAN) 1 MG tablet, Take 1 mg by mouth as needed. Ona tablest as needed for procedures and imaging, Disp: , Rfl:  ?  Magnesium Carbonate POWD, Take 325 mg by mouth daily. 325 mg/2 teaspoons, Disp: , Rfl:  ?  melatonin 3 MG TABS tablet, Take 3 mg by mouth at bedtime., Disp: , Rfl:  ?  midodrine (PROAMATINE) 2.5 MG tablet, Take 1 tablet (2.5 mg total) by mouth 3 (three) times daily., Disp: 90 tablet, Rfl: 8 ?  montelukast (SINGULAIR) 10 MG tablet, Take 1  tablet by mouth daily., Disp: , Rfl:  ?  Multiple Vitamin (MULTI-VITAMINS) TABS, Take 1 tablet by mouth daily., Disp: , Rfl:  ?  nivolumab (OPDIVO) 100 MG/10ML SOLN chemo injection, Inject into the vein., Disp: , Rfl:  ?  Omega-3 Fatty Acids (FISH OIL PO), Take 5 mLs by mouth daily., Disp: , Rfl:  ?  polyethylene glycol powder (GLYCOLAX/MIRALAX) 17 GM/SCOOP powder, Take 17 g by mouth at bedtime., Disp: , Rfl:  ?  Potassium Citrate-Citric Acid 3300-1002 MG PACK, Take by mouth daily., Disp: , Rfl:  ?  predniSONE (DELTASONE) 10 MG tablet, Take 1 tablet (10 mg total) by mouth daily., Disp: 90 tablet, Rfl: 6 ?  promethazine (PHENERGAN) 12.5 MG tablet, Take 1 tablet (12.5 mg total) by mouth every 6 (six) hours as needed for nausea or vomiting., Disp: 20 tablet, Rfl: 0 ?  propranolol (INDERAL) 10 MG tablet, Take 10 mg by mouth daily as needed. For anxiety, Disp: , Rfl:  ?  vitamin E 180 MG (400 UNITS) capsule, Take 400 Units by mouth every morning., Disp: , Rfl:  ? ?Allergies:  ?Allergies  ?Allergen Reactions  ? Morphine Nausea Only  ? ? ?Past Medical History, Surgical history, Social history, and Family History  were reviewed and updated. ? ?Review of Systems: ?Review of Systems  ?Constitutional: Negative.   ?HENT:   Positive for mouth sores.   ?Eyes: Negative.   ?Respiratory: Negative.    ?Cardiovascular: Negative.   ?Gastrointestinal: Negative.   ?Endocrine: Negative.   ?Genitourinary: Negative.    ?Musculoskeletal:  Positive for arthralgias and back pain.  ?Skin: Negative.   ?Neurological: Negative.   ?Hematological: Negative.   ?Psychiatric/Behavioral: Negative.    ? ?Physical Exam: ? weight is 240 lb (108.9 kg). His oral temperature is 98.4 ?F (36.9 ?C). His blood pressure is 104/62 and his pulse is 79. His respiration is 17 and oxygen saturation is 99%.  ? ?Wt Readings from Last 3 Encounters:  ?04/26/21 240 lb (108.9 kg)  ?03/29/21 241 lb 1.9 oz (109.4 kg)  ?03/01/21 240 lb (108.9 kg)  ? ? ?Physical Exam ?Vitals  reviewed.  ?HENT:  ?   Head: Normocephalic and atraumatic.  ?Eyes:  ?   Pupils: Pupils are equal, round, and reactive to light.  ?Cardiovascular:  ?   Rate and Rhythm: Normal rate and regular rhythm.  ?   Heart sounds: Normal heart sounds.  ?Pulmonary:  ?   Effort: Pulmonary effort is normal.  ?   Breath sounds: Normal breath sounds.  ?Abdominal:  ?   General: Bowel sounds are normal.  ?   Palpations: Abdomen is soft.  ?Musculoskeletal:     ?   General: No tenderness or deformity. Normal range of motion.  ?   Cervical back: Normal range of motion.  ?Lymphadenopathy:  ?   Cervical: No cervical adenopathy.  ?Skin: ?   General: Skin is warm and dry.  ?   Findings: No erythema or rash.  ?Neurological:  ?   Mental Status: He is alert and oriented to person, place, and time.  ?Psychiatric:     ?   Behavior: Behavior normal.     ?   Thought Content: Thought content normal.     ?   Judgment: Judgment normal.  ? ? ? ?Lab Results  ?Component Value Date  ? WBC 5.1 04/26/2021  ? HGB 13.0 04/26/2021  ? HCT 38.7 (L) 04/26/2021  ? MCV 94.4 04/26/2021  ? PLT 174 04/26/2021  ? ?  Chemistry   ?   ?Component Value Date/Time  ? NA 142 03/29/2021 0805  ? K 3.6 03/29/2021 0805  ? CL 108 03/29/2021 0805  ? CO2 29 03/29/2021 0805  ? BUN 25 (H) 03/29/2021 0805  ? CREATININE 0.94 03/29/2021 0805  ?    ?Component Value Date/Time  ? CALCIUM 9.9 03/29/2021 0805  ? ALKPHOS 76 03/29/2021 0805  ? AST 10 (L) 03/29/2021 0805  ? ALT 15 03/29/2021 0805  ? BILITOT 0.6 03/29/2021 0805  ?  ? ? ?Impression and Plan: ?Francis Cunningham is a very nice 71 year old white male.  He has metastatic renal cell carcinoma.  Thankfully, he is responding pretty well to immunotherapy.  ? ?We will have to repeat another PET scan on him.  We will get this done in April. ? ?I do not see any issues with respect to him having the hairline fracture.  I do not think there is anything to do with his treatments.  He is taking vitamin D.  His calcium has been okay.  We have not been  able to give him any type of bisphosphonate because of the dental issues that he has had.   ? ?We will plan to get him back in 4  weeks. ? ?  ? ?Volanda Napoleon, MD ?3/29/20238:37 AM ?

## 2021-04-26 NOTE — Addendum Note (Signed)
Addended by: Volanda Napoleon on: 04/26/2021 08:53 AM ? ? Modules accepted: Orders ? ?

## 2021-05-18 ENCOUNTER — Encounter (HOSPITAL_COMMUNITY)
Admission: RE | Admit: 2021-05-18 | Discharge: 2021-05-18 | Disposition: A | Discharge status: Home / Self Care | Payer: Medicare Other | Source: Ambulatory Visit | Attending: Hematology & Oncology | Admitting: Hematology & Oncology

## 2021-05-18 ENCOUNTER — Telehealth: Payer: Self-pay

## 2021-05-18 DIAGNOSIS — C641 Malignant neoplasm of right kidney, except renal pelvis: Secondary | ICD-10-CM | POA: Insufficient documentation

## 2021-05-18 LAB — GLUCOSE, CAPILLARY: Glucose-Capillary: 96 mg/dL (ref 70–99)

## 2021-05-18 MED ORDER — FLUDEOXYGLUCOSE F - 18 (FDG) INJECTION
12.0000 | Freq: Once | INTRAVENOUS | Status: AC
  Administered 2021-05-18: 11.85 via INTRAVENOUS

## 2021-05-18 NOTE — Telephone Encounter (Signed)
Left message with pt to discuss PET results. And sent my-chart  ?

## 2021-05-18 NOTE — Telephone Encounter (Signed)
-----   Message from Volanda Napoleon, MD sent at 05/18/2021  3:49 PM EDT ----- ?Please call him and let him know that the PET scan still shows that he is in remission.  Everything looks fantastic.  God is great!!  Laurey Arrow ?

## 2021-05-24 ENCOUNTER — Inpatient Hospital Stay: Payer: Medicare Other

## 2021-05-24 ENCOUNTER — Inpatient Hospital Stay: Payer: Medicare Other | Attending: Hematology & Oncology

## 2021-05-24 ENCOUNTER — Inpatient Hospital Stay (HOSPITAL_BASED_OUTPATIENT_CLINIC_OR_DEPARTMENT_OTHER): Payer: Medicare Other | Admitting: Hematology & Oncology

## 2021-05-24 ENCOUNTER — Encounter: Payer: Self-pay | Admitting: Hematology & Oncology

## 2021-05-24 ENCOUNTER — Other Ambulatory Visit: Payer: Self-pay

## 2021-05-24 VITALS — BP 97/69 | HR 71 | Temp 97.6°F | Resp 18 | Wt 238.0 lb

## 2021-05-24 DIAGNOSIS — Z79899 Other long term (current) drug therapy: Secondary | ICD-10-CM | POA: Diagnosis not present

## 2021-05-24 DIAGNOSIS — Z5112 Encounter for antineoplastic immunotherapy: Secondary | ICD-10-CM | POA: Diagnosis present

## 2021-05-24 DIAGNOSIS — C641 Malignant neoplasm of right kidney, except renal pelvis: Secondary | ICD-10-CM

## 2021-05-24 DIAGNOSIS — Z923 Personal history of irradiation: Secondary | ICD-10-CM | POA: Diagnosis not present

## 2021-05-24 DIAGNOSIS — C7951 Secondary malignant neoplasm of bone: Secondary | ICD-10-CM | POA: Insufficient documentation

## 2021-05-24 DIAGNOSIS — C649 Malignant neoplasm of unspecified kidney, except renal pelvis: Secondary | ICD-10-CM | POA: Diagnosis present

## 2021-05-24 DIAGNOSIS — C7989 Secondary malignant neoplasm of other specified sites: Secondary | ICD-10-CM

## 2021-05-24 DIAGNOSIS — Z87891 Personal history of nicotine dependence: Secondary | ICD-10-CM | POA: Diagnosis not present

## 2021-05-24 DIAGNOSIS — Z7952 Long term (current) use of systemic steroids: Secondary | ICD-10-CM | POA: Insufficient documentation

## 2021-05-24 DIAGNOSIS — M549 Dorsalgia, unspecified: Secondary | ICD-10-CM | POA: Diagnosis not present

## 2021-05-24 LAB — CMP (CANCER CENTER ONLY)
ALT: 15 U/L (ref 0–44)
AST: 11 U/L — ABNORMAL LOW (ref 15–41)
Albumin: 3.9 g/dL (ref 3.5–5.0)
Alkaline Phosphatase: 83 U/L (ref 38–126)
Anion gap: 9 (ref 5–15)
BUN: 25 mg/dL — ABNORMAL HIGH (ref 8–23)
CO2: 23 mmol/L (ref 22–32)
Calcium: 10.2 mg/dL (ref 8.9–10.3)
Chloride: 110 mmol/L (ref 98–111)
Creatinine: 1.05 mg/dL (ref 0.61–1.24)
GFR, Estimated: >60 mL/min (ref >60)
Glucose, Bld: 109 mg/dL — ABNORMAL HIGH (ref 70–99)
Potassium: 3.6 mmol/L (ref 3.5–5.1)
Sodium: 142 mmol/L (ref 135–145)
Total Bilirubin: 0.5 mg/dL (ref 0.3–1.2)
Total Protein: 6.1 g/dL — ABNORMAL LOW (ref 6.5–8.1)

## 2021-05-24 LAB — CBC WITH DIFFERENTIAL (CANCER CENTER ONLY)
Abs Immature Granulocytes: 0.02 10*3/uL (ref 0.00–0.07)
Basophils Absolute: 0 10*3/uL (ref 0.0–0.1)
Basophils Relative: 1 %
Eosinophils Absolute: 0.1 10*3/uL (ref 0.0–0.5)
Eosinophils Relative: 2 %
HCT: 39.3 % (ref 39.0–52.0)
Hemoglobin: 13.1 g/dL (ref 13.0–17.0)
Immature Granulocytes: 0 %
Lymphocytes Relative: 26 %
Lymphs Abs: 1.4 10*3/uL (ref 0.7–4.0)
MCH: 31.6 pg (ref 26.0–34.0)
MCHC: 33.3 g/dL (ref 30.0–36.0)
MCV: 94.9 fL (ref 80.0–100.0)
Monocytes Absolute: 0.5 10*3/uL (ref 0.1–1.0)
Monocytes Relative: 9 %
Neutro Abs: 3.3 10*3/uL (ref 1.7–7.7)
Neutrophils Relative %: 62 %
Platelet Count: 166 10*3/uL (ref 150–400)
RBC: 4.14 MIL/uL — ABNORMAL LOW (ref 4.22–5.81)
RDW: 13.5 % (ref 11.5–15.5)
WBC Count: 5.3 10*3/uL (ref 4.0–10.5)
nRBC: 0 % (ref 0.0–0.2)

## 2021-05-24 LAB — TSH: TSH: 1.495 u[IU]/mL (ref 0.350–4.500)

## 2021-05-24 LAB — LACTATE DEHYDROGENASE: LDH: 136 U/L (ref 98–192)

## 2021-05-24 MED ORDER — SODIUM CHLORIDE 0.9 % IV SOLN
480.0000 mg | Freq: Once | INTRAVENOUS | Status: AC
  Administered 2021-05-24: 480 mg via INTRAVENOUS
  Filled 2021-05-24: qty 48

## 2021-05-24 MED ORDER — SODIUM CHLORIDE 0.9 % IV SOLN: Freq: Once | INTRAVENOUS | Status: AC

## 2021-05-24 NOTE — Patient Instructions (Signed)
[facility]  Discharge Instructions: Thank you for choosing [facility] to provide your oncology and hematology care.   If you have a lab appointment with the Cancer Center, please go directly to the Cancer Center and check in at the registration area.  Wear comfortable clothing and clothing appropriate for easy access to any Portacath or PICC line.   We strive to give you quality time with your provider. You may need to reschedule your appointment if you arrive late (15 or more minutes).  Arriving late affects you and other patients whose appointments are after yours.  Also, if you miss three or more appointments without notifying the office, you may be dismissed from the clinic at the provider's discretion.      For prescription refill requests, have your pharmacy contact our office and allow 72 hours for refills to be completed.    Today you received the following chemotherapy and/or immunotherapy agents:  Opdivo      To help prevent nausea and vomiting after your treatment, we encourage you to take your nausea medication as directed.  BELOW ARE SYMPTOMS THAT SHOULD BE REPORTED IMMEDIATELY: *FEVER GREATER THAN 100.4 F (38 C) OR HIGHER *CHILLS OR SWEATING *NAUSEA AND VOMITING THAT IS NOT CONTROLLED WITH YOUR NAUSEA MEDICATION *UNUSUAL SHORTNESS OF BREATH *UNUSUAL BRUISING OR BLEEDING *URINARY PROBLEMS (pain or burning when urinating, or frequent urination) *BOWEL PROBLEMS (unusual diarrhea, constipation, pain near the anus) TENDERNESS IN MOUTH AND THROAT WITH OR WITHOUT PRESENCE OF ULCERS (sore throat, sores in mouth, or a toothache) UNUSUAL RASH, SWELLING OR PAIN  UNUSUAL VAGINAL DISCHARGE OR ITCHING   Items with * indicate a potential emergency and should be followed up as soon as possible or go to the Emergency Department if any problems should occur.  Please show the CHEMOTHERAPY ALERT CARD or IMMUNOTHERAPY ALERT CARD at check-in to the  Emergency Department and triage nurse. Should you have questions after your visit or need to cancel or reschedule your appointment, please contact [facility]  [phone] and follow the prompts.  Office hours are 8:00 a.m. to 4:30 p.m. Monday - Friday. Please note that voicemails left after 4:00 p.m. may not be returned until the following business day.  We are closed weekends and major holidays. You have access to a nurse at all times for urgent questions. Please call the main number to the clinic [phone] and follow the prompts.  For any non-urgent questions, you may also contact your provider using MyChart. We now offer e-Visits for anyone 18 and older to request care online for non-urgent symptoms. For details visit [email or web address].   Also download the MyChart app! Go to the app store, search "MyChart", open the app, select [facility], and log in with your MyChart username and password.  Due to Covid, a mask is required upon entering the hospital/clinic. If you do not have a mask, one will be given to you upon arrival. For doctor visits, patients may have 1 support person aged 18 or older with them. For treatment visits, patients cannot have anyone with them due to current Covid guidelines and our immunocompromised population.  

## 2021-05-24 NOTE — Progress Notes (Signed)
?Hematology and Oncology Follow Up Visit ? ?Kayle Correa ?[id number] ?02/09/1950 71 y.o. ?05/24/2021 ? ? ?Principle Diagnosis:  ?Metastatic renal cell carcinoma-bone only metastasis ? ?Current Therapy:   ?Maintenance nivolumab-Q 4-week dosing ?    ?Interim History:  Mr. Knaak is back for follow-up.  He comes in with his wife.  We that she is able to come in.  They usually come in together.  I think she has had her own business that she has had to attend to. ? ?We did do a PET scan on him.  This was done last week.  The PET scan showed no evidence of hypermetabolic renal cell carcinoma. ? ?He saw his orthopedic surgeon at [facility].  Everything looked good from their point of view.  They do not have to see him back for a year. ? ?He has had a good appetite.  He and his wife had a good Easter. ? ?They have had no problems with bowels or bladder.  He may have some loose stools.  He has had no problems urinating.  He has had no nausea or vomiting.  He has had no cough or shortness of breath.  He has had no rashes.  There is been no leg swelling.  He has had no headache. ? ?Overall, I would say his performance status is probably ECOG 1.   ? ?Medications:  ?Current Outpatient Medications:  ?  alfuzosin (UROXATRAL) 10 MG 24 hr tablet, Take 10 mg by mouth at bedtime., Disp: , Rfl:  ?  ascorbic acid (VITAMIN C) 500 MG tablet, Take 500 mg by mouth in the morning., Disp: , Rfl:  ?  Cholecalciferol 25 MCG (1000 UT) tablet, Take 25 Units by mouth daily., Disp: , Rfl:  ?  escitalopram (LEXAPRO) 20 MG tablet, Take 20 mg by mouth daily., Disp: , Rfl:  ?  finasteride (PROSCAR) 5 MG tablet, Take 5 mg by mouth daily., Disp: , Rfl:  ?  guaiFENesin (MUCINEX) 600 MG 12 hr tablet, Take 600 mg by mouth in the morning and at bedtime., Disp: , Rfl:  ?  Hypertonic Nasal Wash (SINUS RINSE) PACK, Irrigate with as directed., Disp: , Rfl:  ?  LORazepam (ATIVAN) 1 MG tablet, Take 1 mg by mouth as needed. Ona tablest as needed for procedures and imaging,  Disp: , Rfl:  ?  Magnesium Carbonate POWD, Take 325 mg by mouth daily. 325 mg/2 teaspoons, Disp: , Rfl:  ?  melatonin 3 MG TABS tablet, Take 3 mg by mouth at bedtime., Disp: , Rfl:  ?  midodrine (PROAMATINE) 2.5 MG tablet, Take 1 tablet (2.5 mg total) by mouth 3 (three) times daily., Disp: 90 tablet, Rfl: 8 ?  montelukast (SINGULAIR) 10 MG tablet, Take 1 tablet by mouth daily., Disp: , Rfl:  ?  Multiple Vitamin (MULTI-VITAMINS) TABS, Take 1 tablet by mouth daily., Disp: , Rfl:  ?  nivolumab (OPDIVO) 100 MG/10ML SOLN chemo injection, Inject into the vein., Disp: , Rfl:  ?  Omega-3 Fatty Acids (FISH OIL PO), Take 5 mLs by mouth daily., Disp: , Rfl:  ?  polyethylene glycol powder (GLYCOLAX/MIRALAX) 17 GM/SCOOP powder, Take 17 g by mouth at bedtime., Disp: , Rfl:  ?  Potassium Citrate-Citric Acid 3300-1002 MG PACK, Take by mouth daily., Disp: , Rfl:  ?  predniSONE (DELTASONE) 10 MG tablet, Take 1 tablet (10 mg total) by mouth daily., Disp: 90 tablet, Rfl: 6 ?  promethazine (PHENERGAN) 12.5 MG tablet, Take 1 tablet (12.5 mg total) by mouth every  6 (six) hours as needed for nausea or vomiting., Disp: 20 tablet, Rfl: 0 ?  propranolol (INDERAL) 10 MG tablet, Take 10 mg by mouth daily as needed. For anxiety, Disp: , Rfl:  ?  vitamin E 180 MG (400 UNITS) capsule, Take 400 Units by mouth every morning., Disp: , Rfl:  ? ?Allergies:  ?Allergies  ?Allergen Reactions  ? Morphine Nausea Only  ? ? ?Past Medical History, Surgical history, Social history, and Family History were reviewed and updated. ? ?Review of Systems: ?Review of Systems  ?Constitutional: Negative.   ?HENT:   Positive for mouth sores.   ?Eyes: Negative.   ?Respiratory: Negative.    ?Cardiovascular: Negative.   ?Gastrointestinal: Negative.   ?Endocrine: Negative.   ?Genitourinary: Negative.    ?Musculoskeletal:  Positive for arthralgias and back pain.  ?Skin: Negative.   ?Neurological: Negative.   ?Hematological: Negative.   ?Psychiatric/Behavioral: Negative.     ? ?Physical Exam: ? weight is 238 lb (108 kg). His oral temperature is 97.6 ?F (36.4 ?C). His blood pressure is 97/69 and his pulse is 71. His respiration is 18 and oxygen saturation is 98%.  ? ?Wt Readings from Last 3 Encounters:  ?05/24/21 238 lb (108 kg)  ?04/26/21 240 lb (108.9 kg)  ?03/29/21 241 lb 1.9 oz (109.4 kg)  ? ? ?Physical Exam ?Vitals reviewed.  ?HENT:  ?   Head: Normocephalic and atraumatic.  ?Eyes:  ?   Pupils: Pupils are equal, round, and reactive to light.  ?Cardiovascular:  ?   Rate and Rhythm: Normal rate and regular rhythm.  ?   Heart sounds: Normal heart sounds.  ?Pulmonary:  ?   Effort: Pulmonary effort is normal.  ?   Breath sounds: Normal breath sounds.  ?Abdominal:  ?   General: Bowel sounds are normal.  ?   Palpations: Abdomen is soft.  ?Musculoskeletal:     ?   General: No tenderness or deformity. Normal range of motion.  ?   Cervical back: Normal range of motion.  ?Lymphadenopathy:  ?   Cervical: No cervical adenopathy.  ?Skin: ?   General: Skin is warm and dry.  ?   Findings: No erythema or rash.  ?Neurological:  ?   Mental Status: He is alert and oriented to person, place, and time.  ?Psychiatric:     ?   Behavior: Behavior normal.     ?   Thought Content: Thought content normal.     ?   Judgment: Judgment normal.  ? ? ? ?Lab Results  ?Component Value Date  ? WBC 5.3 05/24/2021  ? HGB 13.1 05/24/2021  ? HCT 39.3 05/24/2021  ? MCV 94.9 05/24/2021  ? PLT 166 05/24/2021  ? ?  Chemistry   ?   ?Component Value Date/Time  ? NA 140 04/26/2021 0757  ? K 3.9 04/26/2021 0757  ? CL 108 04/26/2021 0757  ? CO2 25 04/26/2021 0757  ? BUN 26 (H) 04/26/2021 0757  ? CREATININE 0.96 04/26/2021 0757  ?    ?Component Value Date/Time  ? CALCIUM 10.4 (H) 04/26/2021 0757  ? ALKPHOS 83 04/26/2021 0757  ? AST 11 (L) 04/26/2021 0757  ? ALT 16 04/26/2021 0757  ? BILITOT 0.5 04/26/2021 0757  ?  ? ? ?Impression and Plan: ?Mr. Quam is a very nice 71 year old white male.  He has metastatic renal cell carcinoma.   Thankfully, he is responding pretty well to immunotherapy.  ? ?I am happy that the PET scan looks so good.  I  am really not surprised in all honesty.  He has done an incredible job.  He is incredibly motivated.  He has such good support from his family. ? ?For right now, we will continue to see him back monthly. ? ?I am just glad that his quality of life is doing so well. ? ? ? ?Volanda Napoleon, MD ?4/26/20238:14 AM ?

## 2021-06-05 ENCOUNTER — Other Ambulatory Visit: Payer: Self-pay | Admitting: Hematology & Oncology

## 2021-06-21 ENCOUNTER — Encounter: Payer: Self-pay | Admitting: Hematology & Oncology

## 2021-06-21 ENCOUNTER — Inpatient Hospital Stay (HOSPITAL_BASED_OUTPATIENT_CLINIC_OR_DEPARTMENT_OTHER): Payer: Medicare Other | Admitting: Hematology & Oncology

## 2021-06-21 ENCOUNTER — Inpatient Hospital Stay: Payer: Medicare Other

## 2021-06-21 ENCOUNTER — Inpatient Hospital Stay: Payer: Medicare Other | Attending: Hematology & Oncology

## 2021-06-21 VITALS — BP 102/65 | HR 71 | Temp 97.8°F | Resp 20 | Wt 238.1 lb

## 2021-06-21 DIAGNOSIS — C7951 Secondary malignant neoplasm of bone: Secondary | ICD-10-CM | POA: Diagnosis present

## 2021-06-21 DIAGNOSIS — C641 Malignant neoplasm of right kidney, except renal pelvis: Secondary | ICD-10-CM | POA: Diagnosis not present

## 2021-06-21 DIAGNOSIS — M549 Dorsalgia, unspecified: Secondary | ICD-10-CM | POA: Insufficient documentation

## 2021-06-21 DIAGNOSIS — Z923 Personal history of irradiation: Secondary | ICD-10-CM | POA: Diagnosis not present

## 2021-06-21 DIAGNOSIS — Z5112 Encounter for antineoplastic immunotherapy: Secondary | ICD-10-CM | POA: Diagnosis present

## 2021-06-21 DIAGNOSIS — Z79899 Other long term (current) drug therapy: Secondary | ICD-10-CM | POA: Insufficient documentation

## 2021-06-21 DIAGNOSIS — Z7952 Long term (current) use of systemic steroids: Secondary | ICD-10-CM | POA: Insufficient documentation

## 2021-06-21 DIAGNOSIS — Z87891 Personal history of nicotine dependence: Secondary | ICD-10-CM | POA: Diagnosis not present

## 2021-06-21 DIAGNOSIS — C649 Malignant neoplasm of unspecified kidney, except renal pelvis: Secondary | ICD-10-CM | POA: Diagnosis present

## 2021-06-21 LAB — CBC WITH DIFFERENTIAL (CANCER CENTER ONLY)
Abs Immature Granulocytes: 0.01 10*3/uL (ref 0.00–0.07)
Basophils Absolute: 0 10*3/uL (ref 0.0–0.1)
Basophils Relative: 1 %
Eosinophils Absolute: 0.1 10*3/uL (ref 0.0–0.5)
Eosinophils Relative: 2 %
HCT: 40.2 % (ref 39.0–52.0)
Hemoglobin: 13.1 g/dL (ref 13.0–17.0)
Immature Granulocytes: 0 %
Lymphocytes Relative: 29 %
Lymphs Abs: 1.5 10*3/uL (ref 0.7–4.0)
MCH: 31.2 pg (ref 26.0–34.0)
MCHC: 32.6 g/dL (ref 30.0–36.0)
MCV: 95.7 fL (ref 80.0–100.0)
Monocytes Absolute: 0.4 10*3/uL (ref 0.1–1.0)
Monocytes Relative: 8 %
Neutro Abs: 3.2 10*3/uL (ref 1.7–7.7)
Neutrophils Relative %: 60 %
Platelet Count: 169 10*3/uL (ref 150–400)
RBC: 4.2 MIL/uL — ABNORMAL LOW (ref 4.22–5.81)
RDW: 13.4 % (ref 11.5–15.5)
WBC Count: 5.2 10*3/uL (ref 4.0–10.5)
nRBC: 0 % (ref 0.0–0.2)

## 2021-06-21 LAB — CMP (CANCER CENTER ONLY)
ALT: 16 U/L (ref 0–44)
AST: 9 U/L — ABNORMAL LOW (ref 15–41)
Albumin: 3.8 g/dL (ref 3.5–5.0)
Alkaline Phosphatase: 92 U/L (ref 38–126)
Anion gap: 5 (ref 5–15)
BUN: 23 mg/dL (ref 8–23)
CO2: 27 mmol/L (ref 22–32)
Calcium: 10 mg/dL (ref 8.9–10.3)
Chloride: 108 mmol/L (ref 98–111)
Creatinine: 1.01 mg/dL (ref 0.61–1.24)
GFR, Estimated: >60 mL/min (ref >60)
Glucose, Bld: 120 mg/dL — ABNORMAL HIGH (ref 70–99)
Potassium: 3.6 mmol/L (ref 3.5–5.1)
Sodium: 140 mmol/L (ref 135–145)
Total Bilirubin: 0.5 mg/dL (ref 0.3–1.2)
Total Protein: 6 g/dL — ABNORMAL LOW (ref 6.5–8.1)

## 2021-06-21 LAB — LACTATE DEHYDROGENASE: LDH: 142 U/L (ref 98–192)

## 2021-06-21 MED ORDER — SODIUM CHLORIDE 0.9 % IV SOLN
480.0000 mg | Freq: Once | INTRAVENOUS | Status: AC
  Administered 2021-06-21: 480 mg via INTRAVENOUS
  Filled 2021-06-21: qty 48

## 2021-06-21 MED ORDER — SODIUM CHLORIDE 0.9 % IV SOLN: Freq: Once | INTRAVENOUS | Status: AC

## 2021-06-21 NOTE — Patient Instructions (Signed)
[facility]  Discharge Instructions: Thank you for choosing [facility] to provide your oncology and hematology care.   If you have a lab appointment with the Cancer Center, please go directly to the Cancer Center and check in at the registration area.  Wear comfortable clothing and clothing appropriate for easy access to any Portacath or PICC line.   We strive to give you quality time with your provider. You may need to reschedule your appointment if you arrive late (15 or more minutes).  Arriving late affects you and other patients whose appointments are after yours.  Also, if you miss three or more appointments without notifying the office, you may be dismissed from the clinic at the provider's discretion.      For prescription refill requests, have your pharmacy contact our office and allow 72 hours for refills to be completed.    Today you received the following chemotherapy and/or immunotherapy agents opdivo    To help prevent nausea and vomiting after your treatment, we encourage you to take your nausea medication as directed.  BELOW ARE SYMPTOMS THAT SHOULD BE REPORTED IMMEDIATELY: *FEVER GREATER THAN 100.4 F (38 C) OR HIGHER *CHILLS OR SWEATING *NAUSEA AND VOMITING THAT IS NOT CONTROLLED WITH YOUR NAUSEA MEDICATION *UNUSUAL SHORTNESS OF BREATH *UNUSUAL BRUISING OR BLEEDING *URINARY PROBLEMS (pain or burning when urinating, or frequent urination) *BOWEL PROBLEMS (unusual diarrhea, constipation, pain near the anus) TENDERNESS IN MOUTH AND THROAT WITH OR WITHOUT PRESENCE OF ULCERS (sore throat, sores in mouth, or a toothache) UNUSUAL RASH, SWELLING OR PAIN  UNUSUAL VAGINAL DISCHARGE OR ITCHING   Items with * indicate a potential emergency and should be followed up as soon as possible or go to the Emergency Department if any problems should occur.  Please show the CHEMOTHERAPY ALERT CARD or IMMUNOTHERAPY ALERT CARD at check-in to the  Emergency Department and triage nurse. Should you have questions after your visit or need to cancel or reschedule your appointment, please contact [facility]  [phone] and follow the prompts.  Office hours are 8:00 a.m. to 4:30 p.m. Monday - Friday. Please note that voicemails left after 4:00 p.m. may not be returned until the following business day.  We are closed weekends and major holidays. You have access to a nurse at all times for urgent questions. Please call the main number to the clinic [phone] and follow the prompts.  For any non-urgent questions, you may also contact your provider using MyChart. We now offer e-Visits for anyone 18 and older to request care online for non-urgent symptoms. For details visit [email or web address].   Also download the MyChart app! Go to the app store, search "MyChart", open the app, select [facility], and log in with your MyChart username and password.  Due to Covid, a mask is required upon entering the hospital/clinic. If you do not have a mask, one will be given to you upon arrival. For doctor visits, patients may have 1 support person aged 18 or older with them. For treatment visits, patients cannot have anyone with them due to current Covid guidelines and our immunocompromised population.  

## 2021-06-21 NOTE — Progress Notes (Signed)
Hematology and Oncology Follow Up Visit  Jaelyn Cloninger [id number] 04-25-1950 71 y.o. 06/21/2021   Principle Diagnosis:  Metastatic renal cell carcinoma-bone only metastasis  Current Therapy:   Maintenance nivolumab-Q 4-week dosing     Interim History:  Mr. Dyke is back for follow-up.  He feels good.  He has had no complaints since we last saw him.  His wife seems to have a little bit of a sinus infection.  He is going try to exercise a little bit more.  There is a gym where he lives.  He is going to try to utilize this more.  He has had no problems with his mouth.  He has seen oral surgery in the past.  He has had no nausea or vomiting.  There is no change in bowel or bladder habits.  He has had no headache.  He has had no dysphagia or odynophagia.  Overall, I would say his performance status is probably ECOG 0.    Medications:  Current Outpatient Medications:    alfuzosin (UROXATRAL) 10 MG 24 hr tablet, Take 10 mg by mouth at bedtime., Disp: , Rfl:    ascorbic acid (VITAMIN C) 500 MG tablet, Take 500 mg by mouth in the morning., Disp: , Rfl:    benzonatate (TESSALON) 100 MG capsule, Take 100 mg by mouth 3 (three) times daily as needed., Disp: , Rfl:    Cholecalciferol 25 MCG (1000 UT) tablet, Take 25 Units by mouth daily., Disp: , Rfl:    doxycycline (VIBRAMYCIN) 100 MG capsule, Take 100 mg by mouth 2 (two) times daily., Disp: , Rfl:    escitalopram (LEXAPRO) 20 MG tablet, Take 20 mg by mouth daily., Disp: , Rfl:    finasteride (PROSCAR) 5 MG tablet, Take 5 mg by mouth daily., Disp: , Rfl:    fluticasone (FLONASE) 50 MCG/ACT nasal spray, Place into both nostrils., Disp: , Rfl:    guaiFENesin (MUCINEX) 600 MG 12 hr tablet, Take 600 mg by mouth in the morning and at bedtime., Disp: , Rfl:    Hypertonic Nasal Wash (SINUS RINSE) PACK, Irrigate with as directed., Disp: , Rfl:    Magnesium Carbonate POWD, Take 325 mg by mouth daily. 325 mg/2 teaspoons, Disp: , Rfl:    melatonin 3 MG TABS  tablet, Take 3 mg by mouth at bedtime., Disp: , Rfl:    midodrine (PROAMATINE) 2.5 MG tablet, TAKE ONE (1) TABLET BY MOUTH 3 TIMES DAILY, Disp: 90 tablet, Rfl: 8   montelukast (SINGULAIR) 10 MG tablet, Take 1 tablet by mouth daily., Disp: , Rfl:    Multiple Vitamin (MULTI-VITAMINS) TABS, Take 1 tablet by mouth daily., Disp: , Rfl:    nivolumab (OPDIVO) 100 MG/10ML SOLN chemo injection, Inject into the vein., Disp: , Rfl:    Omega-3 Fatty Acids (FISH OIL PO), Take 5 mLs by mouth daily., Disp: , Rfl:    Potassium Citrate-Citric Acid 3300-1002 MG PACK, Take by mouth daily., Disp: , Rfl:    predniSONE (DELTASONE) 10 MG tablet, Take 1 tablet (10 mg total) by mouth daily., Disp: 90 tablet, Rfl: 6   vitamin E 180 MG (400 UNITS) capsule, Take 400 Units by mouth every morning., Disp: , Rfl:    LORazepam (ATIVAN) 1 MG tablet, Take 1 mg by mouth as needed. Ona tablest as needed for procedures and imaging (Patient not taking: Reported on 06/21/2021), Disp: , Rfl:    polyethylene glycol powder (GLYCOLAX/MIRALAX) 17 GM/SCOOP powder, Take 17 g by mouth at bedtime. (Patient not taking:  Reported on 06/21/2021), Disp: , Rfl:    promethazine (PHENERGAN) 12.5 MG tablet, Take 1 tablet (12.5 mg total) by mouth every 6 (six) hours as needed for nausea or vomiting. (Patient not taking: Reported on 06/21/2021), Disp: 20 tablet, Rfl: 0   propranolol (INDERAL) 10 MG tablet, Take 10 mg by mouth daily as needed. For anxiety (Patient not taking: Reported on 06/21/2021), Disp: , Rfl:   Allergies:  Allergies  Allergen Reactions   Morphine Nausea Only    Past Medical History, Surgical history, Social history, and Family History were reviewed and updated.  Review of Systems: Review of Systems  Constitutional: Negative.   HENT:   Positive for mouth sores.   Eyes: Negative.   Respiratory: Negative.    Cardiovascular: Negative.   Gastrointestinal: Negative.   Endocrine: Negative.   Genitourinary: Negative.     Musculoskeletal:  Positive for arthralgias and back pain.  Skin: Negative.   Neurological: Negative.   Hematological: Negative.   Psychiatric/Behavioral: Negative.     Physical Exam:  weight is 238 lb 1.3 oz (108 kg). His oral temperature is 97.8 F (36.6 C). His blood pressure is 102/65 and his pulse is 71. His respiration is 20 and oxygen saturation is 99%.   Wt Readings from Last 3 Encounters:  06/21/21 238 lb 1.3 oz (108 kg)  05/24/21 238 lb (108 kg)  04/26/21 240 lb (108.9 kg)    Physical Exam Vitals reviewed.  HENT:     Head: Normocephalic and atraumatic.  Eyes:     Pupils: Pupils are equal, round, and reactive to light.  Cardiovascular:     Rate and Rhythm: Normal rate and regular rhythm.     Heart sounds: Normal heart sounds.  Pulmonary:     Effort: Pulmonary effort is normal.     Breath sounds: Normal breath sounds.  Abdominal:     General: Bowel sounds are normal.     Palpations: Abdomen is soft.  Musculoskeletal:        General: No tenderness or deformity. Normal range of motion.     Cervical back: Normal range of motion.  Lymphadenopathy:     Cervical: No cervical adenopathy.  Skin:    General: Skin is warm and dry.     Findings: No erythema or rash.  Neurological:     Mental Status: He is alert and oriented to person, place, and time.  Psychiatric:        Behavior: Behavior normal.        Thought Content: Thought content normal.        Judgment: Judgment normal.     Lab Results  Component Value Date   WBC 5.2 06/21/2021   HGB 13.1 06/21/2021   HCT 40.2 06/21/2021   MCV 95.7 06/21/2021   PLT 169 06/21/2021     Chemistry      Component Value Date/Time   NA 142 05/24/2021 0749   K 3.6 05/24/2021 0749   CL 110 05/24/2021 0749   CO2 23 05/24/2021 0749   BUN 25 (H) 05/24/2021 0749   CREATININE 1.05 05/24/2021 0749      Component Value Date/Time   CALCIUM 10.2 05/24/2021 0749   ALKPHOS 83 05/24/2021 0749   AST 11 (L) 05/24/2021 0749   ALT  15 05/24/2021 0749   BILITOT 0.5 05/24/2021 0749      Impression and Plan: Mr. Eckrich is a very nice 71 year old white male.  He has metastatic renal cell carcinoma.  Thankfully, he is responding pretty well  to immunotherapy.   I am happy that the PET scan looks so good.  I am really not surprised in all honesty.  He has done an incredible job.  He is incredibly motivated.  He has such good support from his family.  For right now, we will continue to see him back monthly.  Maybe, at some point in the future, we can move his treatments out to a little bit longer.  Hopefully, he will be able to utilize the gymnasium where he lives.  He really wants to try to lose a little bit of weight and try to get in little better shape.    Volanda Napoleon, MD 5/24/20238:44 AM

## 2021-07-18 ENCOUNTER — Other Ambulatory Visit: Payer: Self-pay | Admitting: *Deleted

## 2021-07-18 ENCOUNTER — Encounter: Payer: Self-pay | Admitting: Hematology & Oncology

## 2021-07-18 DIAGNOSIS — C7989 Secondary malignant neoplasm of other specified sites: Secondary | ICD-10-CM

## 2021-07-18 DIAGNOSIS — N401 Enlarged prostate with lower urinary tract symptoms: Secondary | ICD-10-CM

## 2021-07-18 DIAGNOSIS — N529 Male erectile dysfunction, unspecified: Secondary | ICD-10-CM

## 2021-07-18 DIAGNOSIS — C641 Malignant neoplasm of right kidney, except renal pelvis: Secondary | ICD-10-CM

## 2021-07-18 DIAGNOSIS — C689 Malignant neoplasm of urinary organ, unspecified: Secondary | ICD-10-CM

## 2021-07-19 ENCOUNTER — Inpatient Hospital Stay (HOSPITAL_BASED_OUTPATIENT_CLINIC_OR_DEPARTMENT_OTHER): Payer: Medicare Other | Admitting: Hematology & Oncology

## 2021-07-19 ENCOUNTER — Encounter: Payer: Self-pay | Admitting: Hematology & Oncology

## 2021-07-19 ENCOUNTER — Other Ambulatory Visit: Payer: Self-pay

## 2021-07-19 ENCOUNTER — Inpatient Hospital Stay: Payer: Medicare Other | Attending: Hematology & Oncology

## 2021-07-19 ENCOUNTER — Other Ambulatory Visit: Payer: Self-pay | Admitting: Oncology

## 2021-07-19 ENCOUNTER — Inpatient Hospital Stay: Payer: Medicare Other

## 2021-07-19 VITALS — BP 97/60 | HR 67 | Temp 97.9°F | Resp 17

## 2021-07-19 VITALS — BP 91/61 | HR 79 | Temp 97.7°F | Resp 18 | Ht 75.0 in | Wt 236.8 lb

## 2021-07-19 DIAGNOSIS — Z79899 Other long term (current) drug therapy: Secondary | ICD-10-CM | POA: Diagnosis not present

## 2021-07-19 DIAGNOSIS — C649 Malignant neoplasm of unspecified kidney, except renal pelvis: Secondary | ICD-10-CM | POA: Diagnosis present

## 2021-07-19 DIAGNOSIS — N401 Enlarged prostate with lower urinary tract symptoms: Secondary | ICD-10-CM

## 2021-07-19 DIAGNOSIS — E02 Subclinical iodine-deficiency hypothyroidism: Secondary | ICD-10-CM | POA: Diagnosis not present

## 2021-07-19 DIAGNOSIS — N529 Male erectile dysfunction, unspecified: Secondary | ICD-10-CM

## 2021-07-19 DIAGNOSIS — Z87891 Personal history of nicotine dependence: Secondary | ICD-10-CM | POA: Insufficient documentation

## 2021-07-19 DIAGNOSIS — M549 Dorsalgia, unspecified: Secondary | ICD-10-CM | POA: Diagnosis not present

## 2021-07-19 DIAGNOSIS — C689 Malignant neoplasm of urinary organ, unspecified: Secondary | ICD-10-CM

## 2021-07-19 DIAGNOSIS — Z923 Personal history of irradiation: Secondary | ICD-10-CM | POA: Insufficient documentation

## 2021-07-19 DIAGNOSIS — Z5112 Encounter for antineoplastic immunotherapy: Secondary | ICD-10-CM | POA: Diagnosis present

## 2021-07-19 DIAGNOSIS — C641 Malignant neoplasm of right kidney, except renal pelvis: Secondary | ICD-10-CM | POA: Diagnosis not present

## 2021-07-19 DIAGNOSIS — C7951 Secondary malignant neoplasm of bone: Secondary | ICD-10-CM | POA: Insufficient documentation

## 2021-07-19 DIAGNOSIS — Z7952 Long term (current) use of systemic steroids: Secondary | ICD-10-CM | POA: Diagnosis not present

## 2021-07-19 DIAGNOSIS — C7989 Secondary malignant neoplasm of other specified sites: Secondary | ICD-10-CM

## 2021-07-19 LAB — CBC WITH DIFFERENTIAL (CANCER CENTER ONLY)
Abs Immature Granulocytes: 0.01 10*3/uL (ref 0.00–0.07)
Basophils Absolute: 0.1 10*3/uL (ref 0.0–0.1)
Basophils Relative: 1 %
Eosinophils Absolute: 0.1 10*3/uL (ref 0.0–0.5)
Eosinophils Relative: 3 %
HCT: 40 % (ref 39.0–52.0)
Hemoglobin: 13.2 g/dL (ref 13.0–17.0)
Immature Granulocytes: 0 %
Lymphocytes Relative: 29 %
Lymphs Abs: 1.6 10*3/uL (ref 0.7–4.0)
MCH: 31.7 pg (ref 26.0–34.0)
MCHC: 33 g/dL (ref 30.0–36.0)
MCV: 96.2 fL (ref 80.0–100.0)
Monocytes Absolute: 0.5 10*3/uL (ref 0.1–1.0)
Monocytes Relative: 10 %
Neutro Abs: 3.1 10*3/uL (ref 1.7–7.7)
Neutrophils Relative %: 57 %
Platelet Count: 170 10*3/uL (ref 150–400)
RBC: 4.16 MIL/uL — ABNORMAL LOW (ref 4.22–5.81)
RDW: 13.5 % (ref 11.5–15.5)
WBC Count: 5.4 10*3/uL (ref 4.0–10.5)
nRBC: 0 % (ref 0.0–0.2)

## 2021-07-19 LAB — CMP (CANCER CENTER ONLY)
ALT: 16 U/L (ref 0–44)
AST: 11 U/L — ABNORMAL LOW (ref 15–41)
Albumin: 3.9 g/dL (ref 3.5–5.0)
Alkaline Phosphatase: 96 U/L (ref 38–126)
Anion gap: 7 (ref 5–15)
BUN: 30 mg/dL — ABNORMAL HIGH (ref 8–23)
CO2: 25 mmol/L (ref 22–32)
Calcium: 10.5 mg/dL — ABNORMAL HIGH (ref 8.9–10.3)
Chloride: 109 mmol/L (ref 98–111)
Creatinine: 1.11 mg/dL (ref 0.61–1.24)
GFR, Estimated: >60 mL/min (ref >60)
Glucose, Bld: 131 mg/dL — ABNORMAL HIGH (ref 70–99)
Potassium: 3.7 mmol/L (ref 3.5–5.1)
Sodium: 141 mmol/L (ref 135–145)
Total Bilirubin: 0.5 mg/dL (ref 0.3–1.2)
Total Protein: 6.3 g/dL — ABNORMAL LOW (ref 6.5–8.1)

## 2021-07-19 LAB — LACTATE DEHYDROGENASE: LDH: 145 U/L (ref 98–192)

## 2021-07-19 MED ORDER — SODIUM CHLORIDE 0.9 % IV SOLN
480.0000 mg | Freq: Once | INTRAVENOUS | Status: AC
  Administered 2021-07-19: 480 mg via INTRAVENOUS
  Filled 2021-07-19: qty 48

## 2021-07-19 MED ORDER — SODIUM CHLORIDE 0.9 % IV SOLN: Freq: Once | INTRAVENOUS | Status: AC

## 2021-07-19 NOTE — Patient Instructions (Signed)
[facility]  Discharge Instructions: Thank you for choosing [facility] to provide your oncology and hematology care.   If you have a lab appointment with the Cancer Center, please go directly to the Cancer Center and check in at the registration area.  Wear comfortable clothing and clothing appropriate for easy access to any Portacath or PICC line.   We strive to give you quality time with your provider. You may need to reschedule your appointment if you arrive late (15 or more minutes).  Arriving late affects you and other patients whose appointments are after yours.  Also, if you miss three or more appointments without notifying the office, you may be dismissed from the clinic at the provider's discretion.      For prescription refill requests, have your pharmacy contact our office and allow 72 hours for refills to be completed.    Today you received the following chemotherapy and/or immunotherapy agents Opdivo.      To help prevent nausea and vomiting after your treatment, we encourage you to take your nausea medication as directed.  BELOW ARE SYMPTOMS THAT SHOULD BE REPORTED IMMEDIATELY: *FEVER GREATER THAN 100.4 F (38 C) OR HIGHER *CHILLS OR SWEATING *NAUSEA AND VOMITING THAT IS NOT CONTROLLED WITH YOUR NAUSEA MEDICATION *UNUSUAL SHORTNESS OF BREATH *UNUSUAL BRUISING OR BLEEDING *URINARY PROBLEMS (pain or burning when urinating, or frequent urination) *BOWEL PROBLEMS (unusual diarrhea, constipation, pain near the anus) TENDERNESS IN MOUTH AND THROAT WITH OR WITHOUT PRESENCE OF ULCERS (sore throat, sores in mouth, or a toothache) UNUSUAL RASH, SWELLING OR PAIN  UNUSUAL VAGINAL DISCHARGE OR ITCHING   Items with * indicate a potential emergency and should be followed up as soon as possible or go to the Emergency Department if any problems should occur.  Please show the CHEMOTHERAPY ALERT CARD or IMMUNOTHERAPY ALERT CARD at check-in to the  Emergency Department and triage nurse. Should you have questions after your visit or need to cancel or reschedule your appointment, please contact [facility]  [phone] and follow the prompts.  Office hours are 8:00 a.m. to 4:30 p.m. Monday - Friday. Please note that voicemails left after 4:00 p.m. may not be returned until the following business day.  We are closed weekends and major holidays. You have access to a nurse at all times for urgent questions. Please call the main number to the clinic [phone] and follow the prompts.  For any non-urgent questions, you may also contact your provider using MyChart. We now offer e-Visits for anyone 18 and older to request care online for non-urgent symptoms. For details visit [email or web address].   Also download the MyChart app! Go to the app store, search "MyChart", open the app, select [facility], and log in with your MyChart username and password.  Masks are optional in the cancer centers. If you would like for your care team to wear a mask while they are taking care of you, please let them know. For doctor visits, patients may have with them one support person who is at least 73 years old. At this time, visitors are not allowed in the infusion area. 

## 2021-07-19 NOTE — Progress Notes (Signed)
Hematology and Oncology Follow Up Visit  Francis Cunningham [id number] [birth year] 71 y.o. 07/19/2021   Principle Diagnosis:  Metastatic renal cell carcinoma-bone only metastasis  Current Therapy:   Maintenance nivolumab-Q 4-week dosing     Interim History:  Francis Cunningham is back for follow-up.  His wife is able to come in with him.  They both getting over sinus and bronchitis issues.  They were on antibiotics.  He is doing well right now.  He has had no problems with nausea or vomiting.  There is no diarrhea.  He has had no problems with pain.  He has had no leg swelling.  He has had no rashes.  His last TSH back in April was 1.5.  He has had no headache.  His appetite is doing pretty well.  He has had no dysphagia or odynophagia.  Currently, I would say his performance status is probably ECOG 1.    Medications:  Current Outpatient Medications:    alfuzosin (UROXATRAL) 10 MG 24 hr tablet, Take 10 mg by mouth at bedtime., Disp: , Rfl:    ascorbic acid (VITAMIN C) 500 MG tablet, Take 500 mg by mouth in the morning., Disp: , Rfl:    benzonatate (TESSALON) 100 MG capsule, Take 100 mg by mouth 3 (three) times daily as needed., Disp: , Rfl:    Cholecalciferol 25 MCG (1000 UT) tablet, Take 25 Units by mouth daily., Disp: , Rfl:    doxycycline (VIBRAMYCIN) 100 MG capsule, Take 100 mg by mouth 2 (two) times daily., Disp: , Rfl:    escitalopram (LEXAPRO) 20 MG tablet, Take 20 mg by mouth daily., Disp: , Rfl:    finasteride (PROSCAR) 5 MG tablet, Take 5 mg by mouth daily., Disp: , Rfl:    fluticasone (FLONASE) 50 MCG/ACT nasal spray, Place into both nostrils., Disp: , Rfl:    guaiFENesin (MUCINEX) 600 MG 12 hr tablet, Take 600 mg by mouth in the morning and at bedtime., Disp: , Rfl:    Hypertonic Nasal Wash (SINUS RINSE) PACK, Irrigate with as directed., Disp: , Rfl:    LORazepam (ATIVAN) 1 MG tablet, Take 1 mg by mouth as needed. Ona tablest as needed for procedures and imaging (Patient not taking:  Reported on 06/21/2021), Disp: , Rfl:    Magnesium Carbonate POWD, Take 325 mg by mouth daily. 325 mg/2 teaspoons, Disp: , Rfl:    melatonin 3 MG TABS tablet, Take 3 mg by mouth at bedtime., Disp: , Rfl:    midodrine (PROAMATINE) 2.5 MG tablet, TAKE ONE (1) TABLET BY MOUTH 3 TIMES DAILY, Disp: 90 tablet, Rfl: 8   montelukast (SINGULAIR) 10 MG tablet, Take 1 tablet by mouth daily., Disp: , Rfl:    Multiple Vitamin (MULTI-VITAMINS) TABS, Take 1 tablet by mouth daily., Disp: , Rfl:    nivolumab (OPDIVO) 100 MG/10ML SOLN chemo injection, Inject into the vein., Disp: , Rfl:    Omega-3 Fatty Acids (FISH OIL PO), Take 5 mLs by mouth daily., Disp: , Rfl:    polyethylene glycol powder (GLYCOLAX/MIRALAX) 17 GM/SCOOP powder, Take 17 g by mouth at bedtime. (Patient not taking: Reported on 06/21/2021), Disp: , Rfl:    Potassium Citrate-Citric Acid 3300-1002 MG PACK, Take by mouth daily., Disp: , Rfl:    predniSONE (DELTASONE) 10 MG tablet, Take 1 tablet (10 mg total) by mouth daily., Disp: 90 tablet, Rfl: 6   promethazine (PHENERGAN) 12.5 MG tablet, Take 1 tablet (12.5 mg total) by mouth every 6 (six) hours as needed for nausea  or vomiting. (Patient not taking: Reported on 06/21/2021), Disp: 20 tablet, Rfl: 0   propranolol (INDERAL) 10 MG tablet, Take 10 mg by mouth daily as needed. For anxiety (Patient not taking: Reported on 06/21/2021), Disp: , Rfl:    vitamin E 180 MG (400 UNITS) capsule, Take 400 Units by mouth every morning., Disp: , Rfl:   Allergies:  Allergies  Allergen Reactions   Morphine Nausea Only    Past Medical History, Surgical history, Social history, and Family History were reviewed and updated.  Review of Systems: Review of Systems  Constitutional: Negative.   HENT:   Positive for mouth sores.   Eyes: Negative.   Respiratory: Negative.    Cardiovascular: Negative.   Gastrointestinal: Negative.   Endocrine: Negative.   Genitourinary: Negative.    Musculoskeletal:  Positive for  arthralgias and back pain.  Skin: Negative.   Neurological: Negative.   Hematological: Negative.   Psychiatric/Behavioral: Negative.      Physical Exam:  vitals were not taken for this visit.   Wt Readings from Last 3 Encounters:  06/21/21 238 lb 1.3 oz (108 kg)  05/24/21 238 lb (108 kg)  04/26/21 240 lb (108.9 kg)    Physical Exam Vitals reviewed.  HENT:     Head: Normocephalic and atraumatic.  Eyes:     Pupils: Pupils are equal, round, and reactive to light.  Cardiovascular:     Rate and Rhythm: Normal rate and regular rhythm.     Heart sounds: Normal heart sounds.  Pulmonary:     Effort: Pulmonary effort is normal.     Breath sounds: Normal breath sounds.  Abdominal:     General: Bowel sounds are normal.     Palpations: Abdomen is soft.  Musculoskeletal:        General: No tenderness or deformity. Normal range of motion.     Cervical back: Normal range of motion.  Lymphadenopathy:     Cervical: No cervical adenopathy.  Skin:    General: Skin is warm and dry.     Findings: No erythema or rash.  Neurological:     Mental Status: He is alert and oriented to person, place, and time.  Psychiatric:        Behavior: Behavior normal.        Thought Content: Thought content normal.        Judgment: Judgment normal.      Lab Results  Component Value Date   WBC 5.2 06/21/2021   HGB 13.1 06/21/2021   HCT 40.2 06/21/2021   MCV 95.7 06/21/2021   PLT 169 06/21/2021     Chemistry      Component Value Date/Time   NA 140 06/21/2021 0815   K 3.6 06/21/2021 0815   CL 108 06/21/2021 0815   CO2 27 06/21/2021 0815   BUN 23 06/21/2021 0815   CREATININE 1.01 06/21/2021 0815      Component Value Date/Time   CALCIUM 10.0 06/21/2021 0815   ALKPHOS 92 06/21/2021 0815   AST 9 (L) 06/21/2021 0815   ALT 16 06/21/2021 0815   BILITOT 0.5 06/21/2021 0815      Impression and Plan: Francis Cunningham is a very nice 71 year old white male.  He has metastatic renal cell carcinoma.   Thankfully, he is responding pretty well to immunotherapy.   So far, things are going quite well.  His quality of life is doing very very nicely.  I hate the fact that he had this bronchitis.  However, it sounds like he  is getting over this nicely.  Hopefully, he will be able to exercise a little bit more.  I think that we do not have do another PET scan probably until August.  He is really done so well.  We will plan to get him back in another month.   Volanda Napoleon, MD 6/21/20238:30 AM

## 2021-07-20 LAB — PSA, TOTAL AND FREE
PSA, Free Pct: 15 %
PSA, Free: 0.06 ng/mL
Prostate Specific Ag, Serum: 0.4 ng/mL (ref 0.0–4.0)

## 2021-08-17 ENCOUNTER — Inpatient Hospital Stay (HOSPITAL_BASED_OUTPATIENT_CLINIC_OR_DEPARTMENT_OTHER): Payer: Medicare Other | Admitting: Hematology & Oncology

## 2021-08-17 ENCOUNTER — Encounter: Payer: Self-pay | Admitting: Hematology & Oncology

## 2021-08-17 ENCOUNTER — Inpatient Hospital Stay: Payer: Medicare Other

## 2021-08-17 ENCOUNTER — Inpatient Hospital Stay: Payer: Medicare Other | Attending: Hematology & Oncology

## 2021-08-17 VITALS — BP 103/56 | HR 72 | Temp 97.5°F | Resp 20 | Wt 239.0 lb

## 2021-08-17 VITALS — BP 108/76 | HR 60 | Resp 17

## 2021-08-17 DIAGNOSIS — Z923 Personal history of irradiation: Secondary | ICD-10-CM | POA: Insufficient documentation

## 2021-08-17 DIAGNOSIS — Z7952 Long term (current) use of systemic steroids: Secondary | ICD-10-CM | POA: Diagnosis not present

## 2021-08-17 DIAGNOSIS — M25511 Pain in right shoulder: Secondary | ICD-10-CM | POA: Insufficient documentation

## 2021-08-17 DIAGNOSIS — C641 Malignant neoplasm of right kidney, except renal pelvis: Secondary | ICD-10-CM

## 2021-08-17 DIAGNOSIS — Z5112 Encounter for antineoplastic immunotherapy: Secondary | ICD-10-CM | POA: Insufficient documentation

## 2021-08-17 DIAGNOSIS — Z79899 Other long term (current) drug therapy: Secondary | ICD-10-CM | POA: Insufficient documentation

## 2021-08-17 DIAGNOSIS — E02 Subclinical iodine-deficiency hypothyroidism: Secondary | ICD-10-CM

## 2021-08-17 DIAGNOSIS — C649 Malignant neoplasm of unspecified kidney, except renal pelvis: Secondary | ICD-10-CM | POA: Diagnosis present

## 2021-08-17 DIAGNOSIS — Z87891 Personal history of nicotine dependence: Secondary | ICD-10-CM | POA: Insufficient documentation

## 2021-08-17 DIAGNOSIS — C7951 Secondary malignant neoplasm of bone: Secondary | ICD-10-CM | POA: Diagnosis present

## 2021-08-17 LAB — CMP (CANCER CENTER ONLY)
ALT: 15 U/L (ref 0–44)
AST: 9 U/L — ABNORMAL LOW (ref 15–41)
Albumin: 3.8 g/dL (ref 3.5–5.0)
Alkaline Phosphatase: 95 U/L (ref 38–126)
Anion gap: 5 (ref 5–15)
BUN: 21 mg/dL (ref 8–23)
CO2: 27 mmol/L (ref 22–32)
Calcium: 10.2 mg/dL (ref 8.9–10.3)
Chloride: 110 mmol/L (ref 98–111)
Creatinine: 0.92 mg/dL (ref 0.61–1.24)
GFR, Estimated: >60 mL/min (ref >60)
Glucose, Bld: 119 mg/dL — ABNORMAL HIGH (ref 70–99)
Potassium: 3.6 mmol/L (ref 3.5–5.1)
Sodium: 142 mmol/L (ref 135–145)
Total Bilirubin: 0.5 mg/dL (ref 0.3–1.2)
Total Protein: 5.7 g/dL — ABNORMAL LOW (ref 6.5–8.1)

## 2021-08-17 LAB — LACTATE DEHYDROGENASE: LDH: 144 U/L (ref 98–192)

## 2021-08-17 LAB — CBC WITH DIFFERENTIAL (CANCER CENTER ONLY)
Abs Immature Granulocytes: 0.02 10*3/uL (ref 0.00–0.07)
Basophils Absolute: 0 10*3/uL (ref 0.0–0.1)
Basophils Relative: 1 %
Eosinophils Absolute: 0.1 10*3/uL (ref 0.0–0.5)
Eosinophils Relative: 2 %
HCT: 38.9 % — ABNORMAL LOW (ref 39.0–52.0)
Hemoglobin: 12.8 g/dL — ABNORMAL LOW (ref 13.0–17.0)
Immature Granulocytes: 0 %
Lymphocytes Relative: 20 %
Lymphs Abs: 1.2 10*3/uL (ref 0.7–4.0)
MCH: 31.8 pg (ref 26.0–34.0)
MCHC: 32.9 g/dL (ref 30.0–36.0)
MCV: 96.5 fL (ref 80.0–100.0)
Monocytes Absolute: 0.6 10*3/uL (ref 0.1–1.0)
Monocytes Relative: 9 %
Neutro Abs: 4.1 10*3/uL (ref 1.7–7.7)
Neutrophils Relative %: 68 %
Platelet Count: 149 10*3/uL — ABNORMAL LOW (ref 150–400)
RBC: 4.03 MIL/uL — ABNORMAL LOW (ref 4.22–5.81)
RDW: 13.7 % (ref 11.5–15.5)
WBC Count: 6.1 10*3/uL (ref 4.0–10.5)
nRBC: 0 % (ref 0.0–0.2)

## 2021-08-17 LAB — TSH: TSH: 1.397 u[IU]/mL (ref 0.350–4.500)

## 2021-08-17 MED ORDER — SODIUM CHLORIDE 0.9 % IV SOLN
480.0000 mg | Freq: Once | INTRAVENOUS | Status: AC
  Administered 2021-08-17: 480 mg via INTRAVENOUS
  Filled 2021-08-17: qty 48

## 2021-08-17 MED ORDER — SODIUM CHLORIDE 0.9 % IV SOLN: Freq: Once | INTRAVENOUS | Status: AC

## 2021-08-17 NOTE — Progress Notes (Signed)
Hematology and Oncology Follow Up Visit  Francis Cunningham [id number] 04-12-1950 71 y.o. 08/17/2021   Principle Diagnosis:  Metastatic renal cell carcinoma-bone only metastasis  Current Therapy:   Maintenance nivolumab-Q 4-week dosing     Interim History:  Francis Cunningham is back for follow-up.  So far, things seem to be going pretty well with him.  His wife comes in with him.  There was a good couple to talk to.  That had a good summer so far.  They have been at [facility].  They have enjoyed it there.  That a wonderful July 4 celebration.  He has little bit of pain in the right shoulder.  He has a decent range of motion.  I know that he has had problems with bony involvement by the kidney cancer in that area in the past.  He has had a little swelling about the ankles.  I think compression socks would probably be a good idea for this.  He has had no change in bowel or bladder habits.  He has had no cough or shortness of breath.  He his wife both had episode of bronchitis last time that we saw him.  He has had no fever.  There is no bleeding.  Overall, I was his performance status is probably ECOG 0.      Medications:  Current Outpatient Medications:    alfuzosin (UROXATRAL) 10 MG 24 hr tablet, Take 10 mg by mouth at bedtime., Disp: , Rfl:    ascorbic acid (VITAMIN C) 500 MG tablet, Take 500 mg by mouth in the morning., Disp: , Rfl:    Cholecalciferol 25 MCG (1000 UT) tablet, Take 2,000 Units by mouth daily., Disp: , Rfl:    escitalopram (LEXAPRO) 20 MG tablet, Take 20 mg by mouth daily., Disp: , Rfl:    finasteride (PROSCAR) 5 MG tablet, Take 5 mg by mouth daily., Disp: , Rfl:    guaiFENesin (MUCINEX) 600 MG 12 hr tablet, Take 600 mg by mouth in the morning and at bedtime., Disp: , Rfl:    LORazepam (ATIVAN) 1 MG tablet, Take 1 mg by mouth as needed. Ona tablest as needed for procedures and imaging, Disp: , Rfl:    Magnesium Carbonate POWD, Take 325 mg by mouth daily. 1 1/2  teaspoons  daily., Disp: , Rfl:    melatonin 3 MG TABS tablet, Take 3 mg by mouth at bedtime., Disp: , Rfl:    midodrine (PROAMATINE) 2.5 MG tablet, TAKE ONE (1) TABLET BY MOUTH 3 TIMES DAILY, Disp: 90 tablet, Rfl: 8   montelukast (SINGULAIR) 10 MG tablet, Take 1 tablet by mouth daily., Disp: , Rfl:    Multiple Vitamin (MULTI-VITAMINS) TABS, Take 1 tablet by mouth daily., Disp: , Rfl:    nivolumab (OPDIVO) 100 MG/10ML SOLN chemo injection, Inject into the vein., Disp: , Rfl:    Potassium Citrate-Citric Acid 3300-1002 MG PACK, Take by mouth daily., Disp: , Rfl:    predniSONE (DELTASONE) 10 MG tablet, Take 1 tablet (10 mg total) by mouth daily., Disp: 90 tablet, Rfl: 6   Probiotic Product (PROBIOTIC ACIDOPHILUS BEADS PO), Take by mouth daily., Disp: , Rfl:    vitamin E 180 MG (400 UNITS) capsule, Take 400 Units by mouth every morning., Disp: , Rfl:    fluticasone (FLONASE) 50 MCG/ACT nasal spray, Place into both nostrils. (Patient not taking: Reported on 07/19/2021), Disp: , Rfl:    Hypertonic Nasal Wash (SINUS RINSE) PACK, Irrigate with as directed. (Patient not taking: Reported on  08/17/2021), Disp: , Rfl:    Omega-3 Fatty Acids (FISH OIL PO), Take 5 mLs by mouth daily. (Patient not taking: Reported on 08/17/2021), Disp: , Rfl:    polyethylene glycol powder (GLYCOLAX/MIRALAX) 17 GM/SCOOP powder, Take 17 g by mouth at bedtime. (Patient not taking: Reported on 06/21/2021), Disp: , Rfl:    promethazine (PHENERGAN) 12.5 MG tablet, Take 1 tablet (12.5 mg total) by mouth every 6 (six) hours as needed for nausea or vomiting. (Patient not taking: Reported on 06/21/2021), Disp: 20 tablet, Rfl: 0   propranolol (INDERAL) 10 MG tablet, Take 10 mg by mouth daily as needed. For anxiety (Patient not taking: Reported on 06/21/2021), Disp: , Rfl:   Allergies:  Allergies  Allergen Reactions   Morphine Nausea Only    Past Medical History, Surgical history, Social history, and Family History were reviewed and updated.  Review  of Systems: Review of Systems  Constitutional: Negative.   HENT:   Positive for mouth sores.   Eyes: Negative.   Respiratory: Negative.    Cardiovascular: Negative.   Gastrointestinal: Negative.   Endocrine: Negative.   Genitourinary: Negative.    Musculoskeletal:  Positive for arthralgias and back pain.  Skin: Negative.   Neurological: Negative.   Hematological: Negative.   Psychiatric/Behavioral: Negative.      Physical Exam:  height is '6\' 3"'$  (1.905 m) (pended) and weight is 239 lb 0.6 oz (108.4 kg). His oral temperature is 97.5 F (36.4 C) (abnormal). His blood pressure is 103/56 (abnormal) and his pulse is 72. His respiration is 20 and oxygen saturation is 98%.   Wt Readings from Last 3 Encounters:  08/17/21 239 lb 0.6 oz (108.4 kg)  07/19/21 236 lb 12.8 oz (107.4 kg)  06/21/21 238 lb 1.3 oz (108 kg)    Physical Exam Vitals reviewed.  HENT:     Head: Normocephalic and atraumatic.  Eyes:     Pupils: Pupils are equal, round, and reactive to light.  Cardiovascular:     Rate and Rhythm: Normal rate and regular rhythm.     Heart sounds: Normal heart sounds.  Pulmonary:     Effort: Pulmonary effort is normal.     Breath sounds: Normal breath sounds.  Abdominal:     General: Bowel sounds are normal.     Palpations: Abdomen is soft.  Musculoskeletal:        General: No tenderness or deformity. Normal range of motion.     Cervical back: Normal range of motion.  Lymphadenopathy:     Cervical: No cervical adenopathy.  Skin:    General: Skin is warm and dry.     Findings: No erythema or rash.  Neurological:     Mental Status: He is alert and oriented to person, place, and time.  Psychiatric:        Behavior: Behavior normal.        Thought Content: Thought content normal.        Judgment: Judgment normal.      Lab Results  Component Value Date   WBC 6.1 08/17/2021   HGB 12.8 (L) 08/17/2021   HCT 38.9 (L) 08/17/2021   MCV 96.5 08/17/2021   PLT 149 (L)  08/17/2021     Chemistry      Component Value Date/Time   NA 142 08/17/2021 0848   K 3.6 08/17/2021 0848   CL 110 08/17/2021 0848   CO2 27 08/17/2021 0848   BUN 21 08/17/2021 0848   CREATININE 0.92 08/17/2021 0848  Component Value Date/Time   CALCIUM 10.2 08/17/2021 0848   ALKPHOS 95 08/17/2021 0848   AST 9 (L) 08/17/2021 0848   ALT 15 08/17/2021 0848   BILITOT 0.5 08/17/2021 0848      Impression and Plan: Francis Cunningham is a very nice 71 year old white male.  He has metastatic renal cell carcinoma.  Thankfully, he is responding pretty well to immunotherapy.   So far, things are going quite well.  His quality of life is doing very very nicely.  Hopefully, we will be able to do a little more exercising.  He says that he is walking a little more.  We will go ahead and get a PET scan set up for him in August.  I think this would be reasonable.  If this PET scan looks okay, there may we can move his PET scan to maybe 5 or 6 months.    I will plan to see him back in 1 more month.  Volanda Napoleon, MD 7/20/20239:35 AM

## 2021-08-17 NOTE — Patient Instructions (Signed)
[facility]  Discharge Instructions: Thank you for choosing [facility] to provide your oncology and hematology care.   If you have a lab appointment with the Cancer Center, please go directly to the Cancer Center and check in at the registration area.  Wear comfortable clothing and clothing appropriate for easy access to any Portacath or PICC line.   We strive to give you quality time with your provider. You may need to reschedule your appointment if you arrive late (15 or more minutes).  Arriving late affects you and other patients whose appointments are after yours.  Also, if you miss three or more appointments without notifying the office, you may be dismissed from the clinic at the provider's discretion.      For prescription refill requests, have your pharmacy contact our office and allow 72 hours for refills to be completed.    Today you received the following chemotherapy and/or immunotherapy agents Opdivo.      To help prevent nausea and vomiting after your treatment, we encourage you to take your nausea medication as directed.  BELOW ARE SYMPTOMS THAT SHOULD BE REPORTED IMMEDIATELY: *FEVER GREATER THAN 100.4 F (38 C) OR HIGHER *CHILLS OR SWEATING *NAUSEA AND VOMITING THAT IS NOT CONTROLLED WITH YOUR NAUSEA MEDICATION *UNUSUAL SHORTNESS OF BREATH *UNUSUAL BRUISING OR BLEEDING *URINARY PROBLEMS (pain or burning when urinating, or frequent urination) *BOWEL PROBLEMS (unusual diarrhea, constipation, pain near the anus) TENDERNESS IN MOUTH AND THROAT WITH OR WITHOUT PRESENCE OF ULCERS (sore throat, sores in mouth, or a toothache) UNUSUAL RASH, SWELLING OR PAIN  UNUSUAL VAGINAL DISCHARGE OR ITCHING   Items with * indicate a potential emergency and should be followed up as soon as possible or go to the Emergency Department if any problems should occur.  Please show the CHEMOTHERAPY ALERT CARD or IMMUNOTHERAPY ALERT CARD at check-in to the  Emergency Department and triage nurse. Should you have questions after your visit or need to cancel or reschedule your appointment, please contact [facility]  [phone] and follow the prompts.  Office hours are 8:00 a.m. to 4:30 p.m. Monday - Friday. Please note that voicemails left after 4:00 p.m. may not be returned until the following business day.  We are closed weekends and major holidays. You have access to a nurse at all times for urgent questions. Please call the main number to the clinic [phone] and follow the prompts.  For any non-urgent questions, you may also contact your provider using MyChart. We now offer e-Visits for anyone 18 and older to request care online for non-urgent symptoms. For details visit [email or web address].   Also download the MyChart app! Go to the app store, search "MyChart", open the app, select [facility], and log in with your MyChart username and password.  Masks are optional in the cancer centers. If you would like for your care team to wear a mask while they are taking care of you, please let them know. For doctor visits, patients may have with them one support person who is at least 73 years old. At this time, visitors are not allowed in the infusion area. 

## 2021-08-21 ENCOUNTER — Other Ambulatory Visit: Payer: Self-pay

## 2021-08-21 ENCOUNTER — Encounter: Payer: Self-pay | Admitting: Hematology & Oncology

## 2021-08-29 ENCOUNTER — Other Ambulatory Visit: Payer: Self-pay

## 2021-08-31 ENCOUNTER — Ambulatory Visit: Payer: Medicare Other | Attending: Internal Medicine

## 2021-08-31 ENCOUNTER — Other Ambulatory Visit (HOSPITAL_BASED_OUTPATIENT_CLINIC_OR_DEPARTMENT_OTHER): Payer: Self-pay

## 2021-08-31 DIAGNOSIS — Z23 Encounter for immunization: Secondary | ICD-10-CM

## 2021-08-31 NOTE — Progress Notes (Signed)
   Covid-19 Vaccination Clinic  Name:  Markcus Lazenby    MRN: [id number] DOB: 1950/11/04  08/31/2021  Mr. Hollinshead was observed post Covid-19 immunization for 30 minutes based on pre-vaccination screening without incident. He was provided with Vaccine Information Sheet and instruction to access the V-Safe system.   Mr. Prout was instructed to call 911 with any severe reactions post vaccine: Difficulty breathing  Swelling of face and throat  A fast heartbeat  A bad rash all over body  Dizziness and weakness   Immunizations Administered     Name Date Dose VIS Date Route   Pfizer Covid-19 Vaccine Bivalent Booster 08/31/2021 11:31 AM 0.3 mL 09/28/2020 Intramuscular   Manufacturer: [place]   Lot: [id number]   [place]: [phone]

## 2021-09-01 ENCOUNTER — Other Ambulatory Visit: Payer: Self-pay

## 2021-09-11 ENCOUNTER — Ambulatory Visit (HOSPITAL_COMMUNITY)
Admission: RE | Admit: 2021-09-11 | Discharge: 2021-09-11 | Disposition: A | Discharge status: Home / Self Care | Payer: Medicare Other | Source: Ambulatory Visit | Attending: Hematology & Oncology | Admitting: Hematology & Oncology

## 2021-09-11 DIAGNOSIS — C641 Malignant neoplasm of right kidney, except renal pelvis: Secondary | ICD-10-CM | POA: Diagnosis present

## 2021-09-11 LAB — GLUCOSE, CAPILLARY: Glucose-Capillary: 90 mg/dL (ref 70–99)

## 2021-09-11 MED ORDER — FLUDEOXYGLUCOSE F - 18 (FDG) INJECTION
12.0000 | Freq: Once | INTRAVENOUS | Status: AC | PRN
  Administered 2021-09-11: 11.94 via INTRAVENOUS

## 2021-09-12 ENCOUNTER — Other Ambulatory Visit (HOSPITAL_BASED_OUTPATIENT_CLINIC_OR_DEPARTMENT_OTHER): Payer: Self-pay

## 2021-09-13 ENCOUNTER — Inpatient Hospital Stay: Payer: Medicare Other

## 2021-09-13 ENCOUNTER — Encounter: Payer: Self-pay | Admitting: Hematology & Oncology

## 2021-09-13 ENCOUNTER — Inpatient Hospital Stay (HOSPITAL_BASED_OUTPATIENT_CLINIC_OR_DEPARTMENT_OTHER): Payer: Medicare Other | Admitting: Hematology & Oncology

## 2021-09-13 ENCOUNTER — Other Ambulatory Visit: Payer: Self-pay

## 2021-09-13 ENCOUNTER — Inpatient Hospital Stay: Payer: Medicare Other | Attending: Hematology & Oncology

## 2021-09-13 VITALS — BP 100/62 | HR 64 | Resp 18

## 2021-09-13 VITALS — BP 92/59 | HR 76 | Temp 97.8°F | Resp 18 | Ht 75.0 in | Wt 236.1 lb

## 2021-09-13 DIAGNOSIS — C641 Malignant neoplasm of right kidney, except renal pelvis: Secondary | ICD-10-CM

## 2021-09-13 DIAGNOSIS — C649 Malignant neoplasm of unspecified kidney, except renal pelvis: Secondary | ICD-10-CM | POA: Diagnosis present

## 2021-09-13 DIAGNOSIS — Z7952 Long term (current) use of systemic steroids: Secondary | ICD-10-CM | POA: Diagnosis not present

## 2021-09-13 DIAGNOSIS — Z923 Personal history of irradiation: Secondary | ICD-10-CM | POA: Insufficient documentation

## 2021-09-13 DIAGNOSIS — Z79899 Other long term (current) drug therapy: Secondary | ICD-10-CM | POA: Diagnosis not present

## 2021-09-13 DIAGNOSIS — Z5112 Encounter for antineoplastic immunotherapy: Secondary | ICD-10-CM | POA: Insufficient documentation

## 2021-09-13 DIAGNOSIS — Z87891 Personal history of nicotine dependence: Secondary | ICD-10-CM | POA: Diagnosis not present

## 2021-09-13 DIAGNOSIS — M549 Dorsalgia, unspecified: Secondary | ICD-10-CM | POA: Insufficient documentation

## 2021-09-13 DIAGNOSIS — C7951 Secondary malignant neoplasm of bone: Secondary | ICD-10-CM | POA: Diagnosis present

## 2021-09-13 LAB — CMP (CANCER CENTER ONLY)
ALT: 16 U/L (ref 0–44)
AST: 9 U/L — ABNORMAL LOW (ref 15–41)
Albumin: 4 g/dL (ref 3.5–5.0)
Alkaline Phosphatase: 96 U/L (ref 38–126)
Anion gap: 7 (ref 5–15)
BUN: 31 mg/dL — ABNORMAL HIGH (ref 8–23)
CO2: 24 mmol/L (ref 22–32)
Calcium: 10.5 mg/dL — ABNORMAL HIGH (ref 8.9–10.3)
Chloride: 110 mmol/L (ref 98–111)
Creatinine: 1.08 mg/dL (ref 0.61–1.24)
GFR, Estimated: >60 mL/min (ref >60)
Glucose, Bld: 117 mg/dL — ABNORMAL HIGH (ref 70–99)
Potassium: 3.7 mmol/L (ref 3.5–5.1)
Sodium: 141 mmol/L (ref 135–145)
Total Bilirubin: 0.5 mg/dL (ref 0.3–1.2)
Total Protein: 6.3 g/dL — ABNORMAL LOW (ref 6.5–8.1)

## 2021-09-13 LAB — CBC WITH DIFFERENTIAL (CANCER CENTER ONLY)
Abs Immature Granulocytes: 0.01 10*3/uL (ref 0.00–0.07)
Basophils Absolute: 0 10*3/uL (ref 0.0–0.1)
Basophils Relative: 1 %
Eosinophils Absolute: 0.2 10*3/uL (ref 0.0–0.5)
Eosinophils Relative: 3 %
HCT: 39.8 % (ref 39.0–52.0)
Hemoglobin: 13.4 g/dL (ref 13.0–17.0)
Immature Granulocytes: 0 %
Lymphocytes Relative: 31 %
Lymphs Abs: 1.5 10*3/uL (ref 0.7–4.0)
MCH: 32.1 pg (ref 26.0–34.0)
MCHC: 33.7 g/dL (ref 30.0–36.0)
MCV: 95.2 fL (ref 80.0–100.0)
Monocytes Absolute: 0.6 10*3/uL (ref 0.1–1.0)
Monocytes Relative: 12 %
Neutro Abs: 2.6 10*3/uL (ref 1.7–7.7)
Neutrophils Relative %: 53 %
Platelet Count: 174 10*3/uL (ref 150–400)
RBC: 4.18 MIL/uL — ABNORMAL LOW (ref 4.22–5.81)
RDW: 13.2 % (ref 11.5–15.5)
WBC Count: 5 10*3/uL (ref 4.0–10.5)
nRBC: 0 % (ref 0.0–0.2)

## 2021-09-13 LAB — LACTATE DEHYDROGENASE: LDH: 145 U/L (ref 98–192)

## 2021-09-13 MED ORDER — SODIUM CHLORIDE 0.9 % IV SOLN
480.0000 mg | Freq: Once | INTRAVENOUS | Status: AC
  Administered 2021-09-13: 480 mg via INTRAVENOUS
  Filled 2021-09-13: qty 48

## 2021-09-13 MED ORDER — SODIUM CHLORIDE 0.9 % IV SOLN: Freq: Once | INTRAVENOUS | Status: AC

## 2021-09-13 NOTE — Addendum Note (Signed)
Addended by: Burney Gauze R on: 09/13/2021 09:24 AM   Modules accepted: Orders

## 2021-09-13 NOTE — Progress Notes (Signed)
Hematology and Oncology Follow Up Visit  Francis Cunningham [id number] 03-13-1950 71 y.o. 09/13/2021   Principle Diagnosis:  Metastatic renal cell carcinoma-bone only metastasis  Current Therapy:   Maintenance nivolumab-Q 4-week dosing     Interim History:  [name] is back for follow-up.  As expected, he is doing quite well.  He is staying active.  He is walking.  He and his wife are both incredibly active at [employer].  We did do a PET scan on him.  This was done on 09/11/2021.  Not surprisingly, the PET scan did not show any evidence of active disease.  He is done incredibly well with the nivolumab.  He has had no diarrhea.  He has had no cough or shortness of breath.  Did have his COVID-vaccine 2 weeks ago.  He has had no rashes.  There is been a little bit of leg swelling, which is chronic.  Overall, I would say that his performance status for now is probably ECOG 1.       Medications:  Current Outpatient Medications:    alfuzosin (UROXATRAL) 10 MG 24 hr tablet, Take 10 mg by mouth at bedtime., Disp: , Rfl:    ascorbic acid (VITAMIN C) 500 MG tablet, Take 500 mg by mouth in the morning., Disp: , Rfl:    Cholecalciferol 25 MCG (1000 UT) tablet, Take 2,000 Units by mouth daily., Disp: , Rfl:    escitalopram (LEXAPRO) 20 MG tablet, Take 20 mg by mouth daily., Disp: , Rfl:    finasteride (PROSCAR) 5 MG tablet, Take 5 mg by mouth daily., Disp: , Rfl:    guaiFENesin (MUCINEX) 600 MG 12 hr tablet, Take 600 mg by mouth in the morning and at bedtime., Disp: , Rfl:    Hypertonic Nasal Wash (SINUS RINSE) PACK, Irrigate with as directed., Disp: , Rfl:    LORazepam (ATIVAN) 1 MG tablet, Take 1 mg by mouth as needed. Ona tablest as needed for procedures and imaging, Disp: , Rfl:    Magnesium Carbonate POWD, Take 325 mg by mouth daily. 1 1/2  teaspoons daily., Disp: , Rfl:    melatonin 3 MG TABS tablet, Take 3 mg by mouth at bedtime., Disp: , Rfl:    midodrine (PROAMATINE) 2.5 MG tablet,  TAKE ONE (1) TABLET BY MOUTH 3 TIMES DAILY, Disp: 90 tablet, Rfl: 8   montelukast (SINGULAIR) 10 MG tablet, Take 1 tablet by mouth daily., Disp: , Rfl:    Multiple Vitamin (MULTI-VITAMINS) TABS, Take 1 tablet by mouth daily., Disp: , Rfl:    nivolumab (OPDIVO) 100 MG/10ML SOLN chemo injection, Inject into the vein., Disp: , Rfl:    Omega-3 Fatty Acids (FISH OIL PO), Take 5 mLs by mouth daily., Disp: , Rfl:    polyethylene glycol powder (GLYCOLAX/MIRALAX) 17 GM/SCOOP powder, Take 17 g by mouth at bedtime., Disp: , Rfl:    Potassium Citrate-Citric Acid 3300-1002 MG PACK, Take by mouth daily., Disp: , Rfl:    predniSONE (DELTASONE) 10 MG tablet, Take 1 tablet (10 mg total) by mouth daily., Disp: 90 tablet, Rfl: 6   Probiotic Product (PROBIOTIC ACIDOPHILUS BEADS PO), Take by mouth daily., Disp: , Rfl:    promethazine (PHENERGAN) 12.5 MG tablet, Take 1 tablet (12.5 mg total) by mouth every 6 (six) hours as needed for nausea or vomiting., Disp: 20 tablet, Rfl: 0   propranolol (INDERAL) 10 MG tablet, Take 10 mg by mouth daily as needed. For anxiety, Disp: , Rfl:    vitamin E 180  MG (400 UNITS) capsule, Take 400 Units by mouth every morning., Disp: , Rfl:   Allergies:  Allergies  Allergen Reactions   Morphine Nausea Only    Past Medical History, Surgical history, Social history, and Family History were reviewed and updated.  Review of Systems: Review of Systems  Constitutional: Negative.   HENT:   Positive for mouth sores.   Eyes: Negative.   Respiratory: Negative.    Cardiovascular: Negative.   Gastrointestinal: Negative.   Endocrine: Negative.   Genitourinary: Negative.    Musculoskeletal:  Positive for arthralgias and back pain.  Skin: Negative.   Neurological: Negative.   Hematological: Negative.   Psychiatric/Behavioral: Negative.      Physical Exam:  height is '6\' 3"'$  (1.905 m) and weight is 236 lb 1.9 oz (107.1 kg). His oral temperature is 97.8 F (36.6 C). His blood pressure is  92/59 (abnormal) and his pulse is 76. His respiration is 18 and oxygen saturation is 98%.   Wt Readings from Last 3 Encounters:  09/13/21 236 lb 1.9 oz (107.1 kg)  08/17/21 239 lb 0.6 oz (108.4 kg)  07/19/21 236 lb 12.8 oz (107.4 kg)    Physical Exam Vitals reviewed.  HENT:     Head: Normocephalic and atraumatic.  Eyes:     Pupils: Pupils are equal, round, and reactive to light.  Cardiovascular:     Rate and Rhythm: Normal rate and regular rhythm.     Heart sounds: Normal heart sounds.  Pulmonary:     Effort: Pulmonary effort is normal.     Breath sounds: Normal breath sounds.  Abdominal:     General: Bowel sounds are normal.     Palpations: Abdomen is soft.  Musculoskeletal:        General: No tenderness or deformity. Normal range of motion.     Cervical back: Normal range of motion.  Lymphadenopathy:     Cervical: No cervical adenopathy.  Skin:    General: Skin is warm and dry.     Findings: No erythema or rash.  Neurological:     Mental Status: He is alert and oriented to person, place, and time.  Psychiatric:        Behavior: Behavior normal.        Thought Content: Thought content normal.        Judgment: Judgment normal.      Lab Results  Component Value Date   WBC 5.0 09/13/2021   HGB 13.4 09/13/2021   HCT 39.8 09/13/2021   MCV 95.2 09/13/2021   PLT 174 09/13/2021     Chemistry      Component Value Date/Time   NA 141 09/13/2021 0807   K 3.7 09/13/2021 0807   CL 110 09/13/2021 0807   CO2 24 09/13/2021 0807   BUN 31 (H) 09/13/2021 0807   CREATININE 1.08 09/13/2021 0807      Component Value Date/Time   CALCIUM 10.5 (H) 09/13/2021 0807   ALKPHOS 96 09/13/2021 0807   AST 9 (L) 09/13/2021 0807   ALT 16 09/13/2021 0807   BILITOT 0.5 09/13/2021 0807      Impression and Plan: [name] is a very nice 71 year old white male.  He has metastatic renal cell carcinoma.  Thankfully, he is continuing to respond to immunotherapy.   I am just happy that his  quality of life is so good right now.  Both he and his wife are incredibly motivated and are incredibly proactive.  We will plan for another follow-up in 1  month.  At some point, we may wish to move his appointments out a little bit longer.  We probably will do another PET scan on him in November.   Volanda Napoleon, MD 8/16/20238:56 AM

## 2021-09-13 NOTE — Patient Instructions (Signed)
[facility]  Discharge Instructions: Thank you for choosing [facility] to provide your oncology and hematology care.   If you have a lab appointment with the Cancer Center, please go directly to the Cancer Center and check in at the registration area.  Wear comfortable clothing and clothing appropriate for easy access to any Portacath or PICC line.   We strive to give you quality time with your provider. You may need to reschedule your appointment if you arrive late (15 or more minutes).  Arriving late affects you and other patients whose appointments are after yours.  Also, if you miss three or more appointments without notifying the office, you may be dismissed from the clinic at the provider's discretion.      For prescription refill requests, have your pharmacy contact our office and allow 72 hours for refills to be completed.    Today you received the following chemotherapy and/or immunotherapy agents opdivo    To help prevent nausea and vomiting after your treatment, we encourage you to take your nausea medication as directed.  BELOW ARE SYMPTOMS THAT SHOULD BE REPORTED IMMEDIATELY: *FEVER GREATER THAN 100.4 F (38 C) OR HIGHER *CHILLS OR SWEATING *NAUSEA AND VOMITING THAT IS NOT CONTROLLED WITH YOUR NAUSEA MEDICATION *UNUSUAL SHORTNESS OF BREATH *UNUSUAL BRUISING OR BLEEDING *URINARY PROBLEMS (pain or burning when urinating, or frequent urination) *BOWEL PROBLEMS (unusual diarrhea, constipation, pain near the anus) TENDERNESS IN MOUTH AND THROAT WITH OR WITHOUT PRESENCE OF ULCERS (sore throat, sores in mouth, or a toothache) UNUSUAL RASH, SWELLING OR PAIN  UNUSUAL VAGINAL DISCHARGE OR ITCHING   Items with * indicate a potential emergency and should be followed up as soon as possible or go to the Emergency Department if any problems should occur.  Please show the CHEMOTHERAPY ALERT CARD or IMMUNOTHERAPY ALERT CARD at check-in to the  Emergency Department and triage nurse. Should you have questions after your visit or need to cancel or reschedule your appointment, please contact [facility]  [phone] and follow the prompts.  Office hours are 8:00 a.m. to 4:30 p.m. Monday - Friday. Please note that voicemails left after 4:00 p.m. may not be returned until the following business day.  We are closed weekends and major holidays. You have access to a nurse at all times for urgent questions. Please call the main number to the clinic [phone] and follow the prompts.  For any non-urgent questions, you may also contact your provider using MyChart. We now offer e-Visits for anyone 18 and older to request care online for non-urgent symptoms. For details visit [email or web address].   Also download the MyChart app! Go to the app store, search "MyChart", open the app, select [facility], and log in with your MyChart username and password.  Masks are optional in the cancer centers. If you would like for your care team to wear a mask while they are taking care of you, please let them know. You may have one support person who is at least 73 years old accompany you for your appointments. 

## 2021-09-14 ENCOUNTER — Other Ambulatory Visit: Payer: Self-pay

## 2021-09-29 ENCOUNTER — Other Ambulatory Visit: Payer: Self-pay

## 2021-10-01 ENCOUNTER — Other Ambulatory Visit: Payer: Self-pay

## 2021-10-11 ENCOUNTER — Other Ambulatory Visit: Payer: Medicare Other

## 2021-10-11 ENCOUNTER — Ambulatory Visit: Payer: Medicare Other | Admitting: Hematology & Oncology

## 2021-10-11 ENCOUNTER — Ambulatory Visit: Payer: Medicare Other

## 2021-10-12 ENCOUNTER — Inpatient Hospital Stay: Payer: Medicare Other | Attending: Hematology & Oncology

## 2021-10-12 ENCOUNTER — Encounter: Payer: Self-pay | Admitting: Hematology & Oncology

## 2021-10-12 ENCOUNTER — Telehealth: Payer: Self-pay | Admitting: *Deleted

## 2021-10-12 ENCOUNTER — Inpatient Hospital Stay (HOSPITAL_BASED_OUTPATIENT_CLINIC_OR_DEPARTMENT_OTHER): Payer: Medicare Other | Admitting: Hematology & Oncology

## 2021-10-12 ENCOUNTER — Other Ambulatory Visit: Payer: Self-pay

## 2021-10-12 ENCOUNTER — Inpatient Hospital Stay: Payer: Medicare Other

## 2021-10-12 VITALS — BP 104/65 | HR 63 | Resp 18

## 2021-10-12 VITALS — BP 88/56 | HR 75 | Temp 97.6°F | Resp 18 | Ht 75.0 in | Wt 239.1 lb

## 2021-10-12 DIAGNOSIS — Z79899 Other long term (current) drug therapy: Secondary | ICD-10-CM | POA: Insufficient documentation

## 2021-10-12 DIAGNOSIS — Z87891 Personal history of nicotine dependence: Secondary | ICD-10-CM | POA: Diagnosis not present

## 2021-10-12 DIAGNOSIS — Z5112 Encounter for antineoplastic immunotherapy: Secondary | ICD-10-CM | POA: Insufficient documentation

## 2021-10-12 DIAGNOSIS — C7951 Secondary malignant neoplasm of bone: Secondary | ICD-10-CM | POA: Insufficient documentation

## 2021-10-12 DIAGNOSIS — Z923 Personal history of irradiation: Secondary | ICD-10-CM | POA: Insufficient documentation

## 2021-10-12 DIAGNOSIS — C641 Malignant neoplasm of right kidney, except renal pelvis: Secondary | ICD-10-CM | POA: Diagnosis not present

## 2021-10-12 DIAGNOSIS — C649 Malignant neoplasm of unspecified kidney, except renal pelvis: Secondary | ICD-10-CM | POA: Insufficient documentation

## 2021-10-12 DIAGNOSIS — Z7952 Long term (current) use of systemic steroids: Secondary | ICD-10-CM | POA: Insufficient documentation

## 2021-10-12 DIAGNOSIS — M549 Dorsalgia, unspecified: Secondary | ICD-10-CM | POA: Insufficient documentation

## 2021-10-12 LAB — CMP (CANCER CENTER ONLY)
ALT: 18 U/L (ref 0–44)
AST: 11 U/L — ABNORMAL LOW (ref 15–41)
Albumin: 3.8 g/dL (ref 3.5–5.0)
Alkaline Phosphatase: 96 U/L (ref 38–126)
Anion gap: 5 (ref 5–15)
BUN: 26 mg/dL — ABNORMAL HIGH (ref 8–23)
CO2: 27 mmol/L (ref 22–32)
Calcium: 10.7 mg/dL — ABNORMAL HIGH (ref 8.9–10.3)
Chloride: 110 mmol/L (ref 98–111)
Creatinine: 0.96 mg/dL (ref 0.61–1.24)
GFR, Estimated: >60 mL/min (ref >60)
Glucose, Bld: 125 mg/dL — ABNORMAL HIGH (ref 70–99)
Potassium: 3.7 mmol/L (ref 3.5–5.1)
Sodium: 142 mmol/L (ref 135–145)
Total Bilirubin: 0.5 mg/dL (ref 0.3–1.2)
Total Protein: 6.1 g/dL — ABNORMAL LOW (ref 6.5–8.1)

## 2021-10-12 LAB — CBC WITH DIFFERENTIAL (CANCER CENTER ONLY)
Abs Immature Granulocytes: 0.01 10*3/uL (ref 0.00–0.07)
Basophils Absolute: 0 10*3/uL (ref 0.0–0.1)
Basophils Relative: 1 %
Eosinophils Absolute: 0.1 10*3/uL (ref 0.0–0.5)
Eosinophils Relative: 3 %
HCT: 40.4 % (ref 39.0–52.0)
Hemoglobin: 13.3 g/dL (ref 13.0–17.0)
Immature Granulocytes: 0 %
Lymphocytes Relative: 36 %
Lymphs Abs: 1.5 10*3/uL (ref 0.7–4.0)
MCH: 31.4 pg (ref 26.0–34.0)
MCHC: 32.9 g/dL (ref 30.0–36.0)
MCV: 95.5 fL (ref 80.0–100.0)
Monocytes Absolute: 0.5 10*3/uL (ref 0.1–1.0)
Monocytes Relative: 12 %
Neutro Abs: 2 10*3/uL (ref 1.7–7.7)
Neutrophils Relative %: 48 %
Platelet Count: 169 10*3/uL (ref 150–400)
RBC: 4.23 MIL/uL (ref 4.22–5.81)
RDW: 12.7 % (ref 11.5–15.5)
WBC Count: 4.1 10*3/uL (ref 4.0–10.5)
nRBC: 0 % (ref 0.0–0.2)

## 2021-10-12 LAB — LACTATE DEHYDROGENASE: LDH: 146 U/L (ref 98–192)

## 2021-10-12 MED ORDER — SODIUM CHLORIDE 0.9 % IV SOLN: Freq: Once | INTRAVENOUS | Status: AC

## 2021-10-12 MED ORDER — SODIUM CHLORIDE 0.9 % IV SOLN
480.0000 mg | Freq: Once | INTRAVENOUS | Status: AC
  Administered 2021-10-12: 480 mg via INTRAVENOUS
  Filled 2021-10-12: qty 48

## 2021-10-12 NOTE — Progress Notes (Signed)
Hematology and Oncology Follow Up Visit  Francis Cunningham [id number] [birth year] 71 y.o. 10/12/2021   Principle Diagnosis:  Metastatic renal cell carcinoma-bone only metastasis  Current Therapy:   Maintenance nivolumab-Q 4-week dosing     Interim History:  Francis Cunningham is back for follow-up.  As expected, he is doing quite well.  He is staying active.  He is walking.  He and his wife are both incredibly active at [employer].  He did have some lower back discomfort recently.  Sure this is probably more so degenerative changes.  He has had good appetite.  He has had no nausea or vomiting.  There is been no change in bowel or bladder habits.  He has had no rashes.  He has had no leg swelling.  He has had no headache.  Overall, I would say his performance status is probably ECOG 1.   Medications:  Current Outpatient Medications:    alfuzosin (UROXATRAL) 10 MG 24 hr tablet, Take 10 mg by mouth at bedtime., Disp: , Rfl:    ascorbic acid (VITAMIN C) 500 MG tablet, Take 500 mg by mouth in the morning., Disp: , Rfl:    Cholecalciferol 25 MCG (1000 UT) tablet, Take 2,000 Units by mouth daily., Disp: , Rfl:    escitalopram (LEXAPRO) 20 MG tablet, Take 20 mg by mouth daily., Disp: , Rfl:    finasteride (PROSCAR) 5 MG tablet, Take 5 mg by mouth daily., Disp: , Rfl:    guaiFENesin (MUCINEX) 600 MG 12 hr tablet, Take 600 mg by mouth in the morning and at bedtime., Disp: , Rfl:    Hypertonic Nasal Wash (SINUS RINSE) PACK, Irrigate with as directed., Disp: , Rfl:    LORazepam (ATIVAN) 1 MG tablet, Take 1 mg by mouth as needed. Ona tablest as needed for procedures and imaging, Disp: , Rfl:    Magnesium Carbonate POWD, Take 325 mg by mouth daily. 1 1/2  teaspoons daily., Disp: , Rfl:    melatonin 3 MG TABS tablet, Take 3 mg by mouth at bedtime., Disp: , Rfl:    midodrine (PROAMATINE) 2.5 MG tablet, TAKE ONE (1) TABLET BY MOUTH 3 TIMES DAILY, Disp: 90 tablet, Rfl: 8   montelukast (SINGULAIR) 10 MG tablet,  Take 1 tablet by mouth daily., Disp: , Rfl:    Multiple Vitamin (MULTI-VITAMINS) TABS, Take 1 tablet by mouth daily., Disp: , Rfl:    nivolumab (OPDIVO) 100 MG/10ML SOLN chemo injection, Inject into the vein., Disp: , Rfl:    Omega-3 Fatty Acids (FISH OIL PO), Take 5 mLs by mouth daily., Disp: , Rfl:    polyethylene glycol powder (GLYCOLAX/MIRALAX) 17 GM/SCOOP powder, Take 17 g by mouth at bedtime., Disp: , Rfl:    Potassium Citrate-Citric Acid 3300-1002 MG PACK, Take by mouth daily., Disp: , Rfl:    predniSONE (DELTASONE) 10 MG tablet, Take 1 tablet (10 mg total) by mouth daily., Disp: 90 tablet, Rfl: 6   Probiotic Product (PROBIOTIC ACIDOPHILUS BEADS PO), Take by mouth daily., Disp: , Rfl:    promethazine (PHENERGAN) 12.5 MG tablet, Take 1 tablet (12.5 mg total) by mouth every 6 (six) hours as needed for nausea or vomiting., Disp: 20 tablet, Rfl: 0   propranolol (INDERAL) 10 MG tablet, Take 10 mg by mouth daily as needed. For anxiety, Disp: , Rfl:    vitamin E 180 MG (400 UNITS) capsule, Take 400 Units by mouth every morning., Disp: , Rfl:   Allergies:  Allergies  Allergen Reactions   Morphine Nausea  Only    Past Medical History, Surgical history, Social history, and Family History were reviewed and updated.  Review of Systems: Review of Systems  Constitutional: Negative.   HENT:   Positive for mouth sores.   Eyes: Negative.   Respiratory: Negative.    Cardiovascular: Negative.   Gastrointestinal: Negative.   Endocrine: Negative.   Genitourinary: Negative.    Musculoskeletal:  Positive for arthralgias and back pain.  Skin: Negative.   Neurological: Negative.   Hematological: Negative.   Psychiatric/Behavioral: Negative.      Physical Exam:  height is '6\' 3"'$  (1.905 m) and weight is 239 lb 1.9 oz (108.5 kg). His oral temperature is 97.6 F (36.4 C). His blood pressure is 88/56 (abnormal). His respiration is 18 and oxygen saturation is 100%.   Wt Readings from Last 3  Encounters:  10/12/21 239 lb 1.9 oz (108.5 kg)  09/13/21 236 lb 1.9 oz (107.1 kg)  08/17/21 239 lb 0.6 oz (108.4 kg)    Physical Exam Vitals reviewed.  HENT:     Head: Normocephalic and atraumatic.  Eyes:     Pupils: Pupils are equal, round, and reactive to light.  Cardiovascular:     Rate and Rhythm: Normal rate and regular rhythm.     Heart sounds: Normal heart sounds.  Pulmonary:     Effort: Pulmonary effort is normal.     Breath sounds: Normal breath sounds.  Abdominal:     General: Bowel sounds are normal.     Palpations: Abdomen is soft.  Musculoskeletal:        General: No tenderness or deformity. Normal range of motion.     Cervical back: Normal range of motion.  Lymphadenopathy:     Cervical: No cervical adenopathy.  Skin:    General: Skin is warm and dry.     Findings: No erythema or rash.  Neurological:     Mental Status: He is alert and oriented to person, place, and time.  Psychiatric:        Behavior: Behavior normal.        Thought Content: Thought content normal.        Judgment: Judgment normal.      Lab Results  Component Value Date   WBC 4.1 10/12/2021   HGB 13.3 10/12/2021   HCT 40.4 10/12/2021   MCV 95.5 10/12/2021   PLT 169 10/12/2021     Chemistry      Component Value Date/Time   NA 141 09/13/2021 0807   K 3.7 09/13/2021 0807   CL 110 09/13/2021 0807   CO2 24 09/13/2021 0807   BUN 31 (H) 09/13/2021 0807   CREATININE 1.08 09/13/2021 0807      Component Value Date/Time   CALCIUM 10.5 (H) 09/13/2021 0807   ALKPHOS 96 09/13/2021 0807   AST 9 (L) 09/13/2021 0807   ALT 16 09/13/2021 0807   BILITOT 0.5 09/13/2021 0807      Impression and Plan: Francis Cunningham is a very nice 71 year old white male.  He has metastatic renal cell carcinoma.  Thankfully, he is continuing to respond to immunotherapy.   I am just happy that his quality of life is so good right now.  Both he and his wife are incredibly motivated and are incredibly  proactive.  I do not see a problem him having a flu vaccine.  I told him that he was going to get that she probably should get about a week before he has his immunotherapy treatment.  We will plan  for another PET scan on him in November.  We will plan for another follow-up in 1 month.    Volanda Napoleon, MD 9/14/20238:29 AM

## 2021-10-12 NOTE — Telephone Encounter (Signed)
Per 10/12/21 los - gave upcoming appointments - confirmed

## 2021-10-12 NOTE — Patient Instructions (Signed)
[facility]  Discharge Instructions: Thank you for choosing [facility] to provide your oncology and hematology care.   If you have a lab appointment with the Cancer Center, please go directly to the Cancer Center and check in at the registration area.  Wear comfortable clothing and clothing appropriate for easy access to any Portacath or PICC line.   We strive to give you quality time with your provider. You may need to reschedule your appointment if you arrive late (15 or more minutes).  Arriving late affects you and other patients whose appointments are after yours.  Also, if you miss three or more appointments without notifying the office, you may be dismissed from the clinic at the provider's discretion.      For prescription refill requests, have your pharmacy contact our office and allow 72 hours for refills to be completed.    Today you received the following chemotherapy and/or immunotherapy agents Opdivo      To help prevent nausea and vomiting after your treatment, we encourage you to take your nausea medication as directed.  BELOW ARE SYMPTOMS THAT SHOULD BE REPORTED IMMEDIATELY: *FEVER GREATER THAN 100.4 F (38 C) OR HIGHER *CHILLS OR SWEATING *NAUSEA AND VOMITING THAT IS NOT CONTROLLED WITH YOUR NAUSEA MEDICATION *UNUSUAL SHORTNESS OF BREATH *UNUSUAL BRUISING OR BLEEDING *URINARY PROBLEMS (pain or burning when urinating, or frequent urination) *BOWEL PROBLEMS (unusual diarrhea, constipation, pain near the anus) TENDERNESS IN MOUTH AND THROAT WITH OR WITHOUT PRESENCE OF ULCERS (sore throat, sores in mouth, or a toothache) UNUSUAL RASH, SWELLING OR PAIN  UNUSUAL VAGINAL DISCHARGE OR ITCHING   Items with * indicate a potential emergency and should be followed up as soon as possible or go to the Emergency Department if any problems should occur.  Please show the CHEMOTHERAPY ALERT CARD or IMMUNOTHERAPY ALERT CARD at check-in to the  Emergency Department and triage nurse. Should you have questions after your visit or need to cancel or reschedule your appointment, please contact [facility]  [phone] and follow the prompts.  Office hours are 8:00 a.m. to 4:30 p.m. Monday - Friday. Please note that voicemails left after 4:00 p.m. may not be returned until the following business day.  We are closed weekends and major holidays. You have access to a nurse at all times for urgent questions. Please call the main number to the clinic [phone] and follow the prompts.  For any non-urgent questions, you may also contact your provider using MyChart. We now offer e-Visits for anyone 18 and older to request care online for non-urgent symptoms. For details visit [email or web address].   Also download the MyChart app! Go to the app store, search "MyChart", open the app, select [facility], and log in with your MyChart username and password.  Masks are optional in the cancer centers. If you would like for your care team to wear a mask while they are taking care of you, please let them know. You may have one support person who is at least 73 years old accompany you for your appointments. 

## 2021-10-13 ENCOUNTER — Other Ambulatory Visit: Payer: Self-pay

## 2021-10-14 ENCOUNTER — Other Ambulatory Visit: Payer: Self-pay

## 2021-10-19 ENCOUNTER — Other Ambulatory Visit: Payer: Self-pay

## 2021-10-29 ENCOUNTER — Encounter: Payer: Self-pay | Admitting: Hematology & Oncology

## 2021-11-08 ENCOUNTER — Inpatient Hospital Stay: Payer: Medicare Other | Attending: Hematology & Oncology

## 2021-11-08 ENCOUNTER — Other Ambulatory Visit: Payer: Self-pay

## 2021-11-08 ENCOUNTER — Inpatient Hospital Stay: Payer: Medicare Other

## 2021-11-08 ENCOUNTER — Inpatient Hospital Stay (HOSPITAL_BASED_OUTPATIENT_CLINIC_OR_DEPARTMENT_OTHER): Payer: Medicare Other | Admitting: Hematology & Oncology

## 2021-11-08 VITALS — BP 93/59 | HR 74 | Temp 97.6°F | Resp 18 | Ht 75.0 in | Wt 240.0 lb

## 2021-11-08 DIAGNOSIS — Z923 Personal history of irradiation: Secondary | ICD-10-CM | POA: Insufficient documentation

## 2021-11-08 DIAGNOSIS — M549 Dorsalgia, unspecified: Secondary | ICD-10-CM | POA: Diagnosis not present

## 2021-11-08 DIAGNOSIS — C641 Malignant neoplasm of right kidney, except renal pelvis: Secondary | ICD-10-CM | POA: Diagnosis not present

## 2021-11-08 DIAGNOSIS — Z7952 Long term (current) use of systemic steroids: Secondary | ICD-10-CM | POA: Diagnosis not present

## 2021-11-08 DIAGNOSIS — Z5112 Encounter for antineoplastic immunotherapy: Secondary | ICD-10-CM | POA: Diagnosis present

## 2021-11-08 DIAGNOSIS — C7951 Secondary malignant neoplasm of bone: Secondary | ICD-10-CM | POA: Insufficient documentation

## 2021-11-08 DIAGNOSIS — C649 Malignant neoplasm of unspecified kidney, except renal pelvis: Secondary | ICD-10-CM | POA: Insufficient documentation

## 2021-11-08 DIAGNOSIS — Z79899 Other long term (current) drug therapy: Secondary | ICD-10-CM | POA: Insufficient documentation

## 2021-11-08 DIAGNOSIS — Z87891 Personal history of nicotine dependence: Secondary | ICD-10-CM | POA: Insufficient documentation

## 2021-11-08 LAB — CBC WITH DIFFERENTIAL (CANCER CENTER ONLY)
Abs Immature Granulocytes: 0.01 10*3/uL (ref 0.00–0.07)
Basophils Absolute: 0 10*3/uL (ref 0.0–0.1)
Basophils Relative: 1 %
Eosinophils Absolute: 0.1 10*3/uL (ref 0.0–0.5)
Eosinophils Relative: 3 %
HCT: 39.5 % (ref 39.0–52.0)
Hemoglobin: 13 g/dL (ref 13.0–17.0)
Immature Granulocytes: 0 %
Lymphocytes Relative: 37 %
Lymphs Abs: 1.6 10*3/uL (ref 0.7–4.0)
MCH: 31.4 pg (ref 26.0–34.0)
MCHC: 32.9 g/dL (ref 30.0–36.0)
MCV: 95.4 fL (ref 80.0–100.0)
Monocytes Absolute: 0.5 10*3/uL (ref 0.1–1.0)
Monocytes Relative: 12 %
Neutro Abs: 2 10*3/uL (ref 1.7–7.7)
Neutrophils Relative %: 47 %
Platelet Count: 166 10*3/uL (ref 150–400)
RBC: 4.14 MIL/uL — ABNORMAL LOW (ref 4.22–5.81)
RDW: 12.8 % (ref 11.5–15.5)
WBC Count: 4.3 10*3/uL (ref 4.0–10.5)
nRBC: 0 % (ref 0.0–0.2)

## 2021-11-08 LAB — CMP (CANCER CENTER ONLY)
ALT: 15 U/L (ref 0–44)
AST: 10 U/L — ABNORMAL LOW (ref 15–41)
Albumin: 3.7 g/dL (ref 3.5–5.0)
Alkaline Phosphatase: 104 U/L (ref 38–126)
Anion gap: 8 (ref 5–15)
BUN: 26 mg/dL — ABNORMAL HIGH (ref 8–23)
CO2: 24 mmol/L (ref 22–32)
Calcium: 10.7 mg/dL — ABNORMAL HIGH (ref 8.9–10.3)
Chloride: 109 mmol/L (ref 98–111)
Creatinine: 0.96 mg/dL (ref 0.61–1.24)
GFR, Estimated: >60 mL/min (ref >60)
Glucose, Bld: 127 mg/dL — ABNORMAL HIGH (ref 70–99)
Potassium: 3.4 mmol/L — ABNORMAL LOW (ref 3.5–5.1)
Sodium: 141 mmol/L (ref 135–145)
Total Bilirubin: 0.5 mg/dL (ref 0.3–1.2)
Total Protein: 6 g/dL — ABNORMAL LOW (ref 6.5–8.1)

## 2021-11-08 LAB — LACTATE DEHYDROGENASE: LDH: 143 U/L (ref 98–192)

## 2021-11-08 MED ORDER — SODIUM CHLORIDE 0.9 % IV SOLN
480.0000 mg | Freq: Once | INTRAVENOUS | Status: AC
  Administered 2021-11-08: 480 mg via INTRAVENOUS
  Filled 2021-11-08: qty 48

## 2021-11-08 MED ORDER — SODIUM CHLORIDE 0.9 % IV SOLN: Freq: Once | INTRAVENOUS | Status: AC

## 2021-11-08 NOTE — Progress Notes (Signed)
Hematology and Oncology Follow Up Visit  Jordon Kristiansen [id number] 16-Jun-1950 71 y.o. 11/08/2021   Principle Diagnosis:  Metastatic renal cell carcinoma-bone only metastasis  Current Therapy:   Maintenance nivolumab-Q 4-week dosing     Interim History:  Mr. Kaeser is back for follow-up.  He comes in with his wife.  They both are doing quite well.  He really has had no problems since we last saw him.  He has had no issues with cough or shortness of breath.  He did get the flu vaccine.  He got the low-dose flu vaccine.  He has had no problems with bowels or bladder.  He has had no problems with leg swelling.  He has been quite active.  He does a lot of walking with his wife.  He is try to watch his appetite.  His weight is up a little bit.  He has had no bleeding.  He has had no fever.  He has had no headache.  His right hip does bother him a little bit.  This is the hip that he had surgery on a couple years ago.  He does see the Orthopedist for this.  I think he has an appointment in the Spring.  Overall, I would say his performance status is probably ECOG 0.    Medications:  Current Outpatient Medications:    alfuzosin (UROXATRAL) 10 MG 24 hr tablet, Take 10 mg by mouth at bedtime., Disp: , Rfl:    ascorbic acid (VITAMIN C) 500 MG tablet, Take 500 mg by mouth in the morning., Disp: , Rfl:    Cholecalciferol 25 MCG (1000 UT) tablet, Take 2,000 Units by mouth daily., Disp: , Rfl:    escitalopram (LEXAPRO) 20 MG tablet, Take 20 mg by mouth daily., Disp: , Rfl:    finasteride (PROSCAR) 5 MG tablet, Take 5 mg by mouth daily., Disp: , Rfl:    guaiFENesin (MUCINEX) 600 MG 12 hr tablet, Take 600 mg by mouth in the morning and at bedtime., Disp: , Rfl:    Hypertonic Nasal Wash (SINUS RINSE) PACK, Irrigate with as directed., Disp: , Rfl:    LORazepam (ATIVAN) 1 MG tablet, Take 1 mg by mouth as needed. Ona tablest as needed for procedures and imaging, Disp: , Rfl:    Magnesium Carbonate POWD,  Take 325 mg by mouth daily. 1 1/2  teaspoons daily., Disp: , Rfl:    melatonin 3 MG TABS tablet, Take 3 mg by mouth at bedtime., Disp: , Rfl:    midodrine (PROAMATINE) 2.5 MG tablet, TAKE ONE (1) TABLET BY MOUTH 3 TIMES DAILY, Disp: 90 tablet, Rfl: 8   montelukast (SINGULAIR) 10 MG tablet, Take 1 tablet by mouth daily., Disp: , Rfl:    Multiple Vitamin (MULTI-VITAMINS) TABS, Take 1 tablet by mouth daily., Disp: , Rfl:    nivolumab (OPDIVO) 100 MG/10ML SOLN chemo injection, Inject into the vein., Disp: , Rfl:    Omega-3 Fatty Acids (FISH OIL PO), Take 5 mLs by mouth daily., Disp: , Rfl:    polyethylene glycol powder (GLYCOLAX/MIRALAX) 17 GM/SCOOP powder, Take 17 g by mouth at bedtime., Disp: , Rfl:    Potassium Citrate-Citric Acid 3300-1002 MG PACK, Take by mouth daily., Disp: , Rfl:    predniSONE (DELTASONE) 10 MG tablet, Take 1 tablet (10 mg total) by mouth daily., Disp: 90 tablet, Rfl: 6   Probiotic Product (PROBIOTIC ACIDOPHILUS BEADS PO), Take by mouth daily., Disp: , Rfl:    promethazine (PHENERGAN) 12.5 MG tablet, Take 1  tablet (12.5 mg total) by mouth every 6 (six) hours as needed for nausea or vomiting., Disp: 20 tablet, Rfl: 0   propranolol (INDERAL) 10 MG tablet, Take 10 mg by mouth daily as needed. For anxiety, Disp: , Rfl:    vitamin E 180 MG (400 UNITS) capsule, Take 400 Units by mouth every morning., Disp: , Rfl:   Allergies:  Allergies  Allergen Reactions   Morphine Nausea Only    Past Medical History, Surgical history, Social history, and Family History were reviewed and updated.  Review of Systems: Review of Systems  Constitutional: Negative.   HENT:   Positive for mouth sores.   Eyes: Negative.   Respiratory: Negative.    Cardiovascular: Negative.   Gastrointestinal: Negative.   Endocrine: Negative.   Genitourinary: Negative.    Musculoskeletal:  Positive for arthralgias and back pain.  Skin: Negative.   Neurological: Negative.   Hematological: Negative.    Psychiatric/Behavioral: Negative.      Physical Exam:  height is '6\' 3"'$  (1.905 m) and weight is 240 lb (108.9 kg). His oral temperature is 97.6 F (36.4 C). His blood pressure is 93/59 (abnormal) and his pulse is 74. His respiration is 18 and oxygen saturation is 98%.   Wt Readings from Last 3 Encounters:  11/08/21 240 lb (108.9 kg)  10/12/21 239 lb 1.9 oz (108.5 kg)  09/13/21 236 lb 1.9 oz (107.1 kg)    Physical Exam Vitals reviewed.  HENT:     Head: Normocephalic and atraumatic.  Eyes:     Pupils: Pupils are equal, round, and reactive to light.  Cardiovascular:     Rate and Rhythm: Normal rate and regular rhythm.     Heart sounds: Normal heart sounds.  Pulmonary:     Effort: Pulmonary effort is normal.     Breath sounds: Normal breath sounds.  Abdominal:     General: Bowel sounds are normal.     Palpations: Abdomen is soft.  Musculoskeletal:        General: No tenderness or deformity. Normal range of motion.     Cervical back: Normal range of motion.  Lymphadenopathy:     Cervical: No cervical adenopathy.  Skin:    General: Skin is warm and dry.     Findings: No erythema or rash.  Neurological:     Mental Status: He is alert and oriented to person, place, and time.  Psychiatric:        Behavior: Behavior normal.        Thought Content: Thought content normal.        Judgment: Judgment normal.      Lab Results  Component Value Date   WBC 4.3 11/08/2021   HGB 13.0 11/08/2021   HCT 39.5 11/08/2021   MCV 95.4 11/08/2021   PLT 166 11/08/2021     Chemistry      Component Value Date/Time   NA 142 10/12/2021 0808   K 3.7 10/12/2021 0808   CL 110 10/12/2021 0808   CO2 27 10/12/2021 0808   BUN 26 (H) 10/12/2021 0808   CREATININE 0.96 10/12/2021 0808      Component Value Date/Time   CALCIUM 10.7 (H) 10/12/2021 0808   ALKPHOS 96 10/12/2021 0808   AST 11 (L) 10/12/2021 0808   ALT 18 10/12/2021 0808   BILITOT 0.5 10/12/2021 0808      Impression and  Plan: Mr. Watlington is a very nice 71 year old white male.  He has metastatic renal cell carcinoma.  Thankfully, he is  continuing to respond to immunotherapy.   We will go ahead and get another PET scan on him.  We will set this up for November.  I am just happy that his quality life is doing so well.  Both he and his wife are very active.  They are very motivated and very proactive.  We will plan to get him back in November right before Thanksgiving.  We will get the PET scan before we see him back.    Volanda Napoleon, MD 10/11/20238:27 AM

## 2021-11-08 NOTE — Patient Instructions (Signed)
Nivolumab Injection What is this medication? NIVOLUMAB (nye VOL ue mab) treats some types of cancer. It works by helping your immune system slow or stop the spread of cancer cells. It is a monoclonal antibody. This medicine may be used for other purposes; ask your health care provider or pharmacist if you have questions. COMMON BRAND NAME(S): Opdivo What should I tell my care team before I take this medication? They need to know if you have any of these conditions: Allogeneic stem cell transplant (uses someone else's stem cells) Autoimmune diseases, such as Crohn disease, ulcerative colitis, lupus History of chest radiation Nervous system problems, such as Guillain-Barre syndrome or myasthenia gravis Organ transplant An unusual or allergic reaction to nivolumab, other medications, foods, dyes, or preservatives Pregnant or trying to get pregnant Breast-feeding How should I use this medication? This medication is infused into a vein. It is given in a hospital or clinic setting. A special MedGuide will be given to you before each treatment. Be sure to read this information carefully each time. Talk to your care team about the use of this medication in children. While it may be prescribed for children as young as 12 years for selected conditions, precautions do apply. Overdosage: If you think you have taken too much of this medicine contact a poison control center or emergency room at once. NOTE: This medicine is only for you. Do not share this medicine with others. What if I miss a dose? Keep appointments for follow-up doses. It is important not to miss your dose. Call your care team if you are unable to keep an appointment. What may interact with this medication? Interactions have not been studied. This list may not describe all possible interactions. Give your health care provider a list of all the medicines, herbs, non-prescription drugs, or dietary supplements you use. Also tell them if you  smoke, drink alcohol, or use illegal drugs. Some items may interact with your medicine. What should I watch for while using this medication? Your condition will be monitored carefully while you are receiving this medication. You may need blood work while taking this medication. This medication may cause serious skin reactions. They can happen weeks to months after starting the medication. Contact your care team right away if you notice fevers or flu-like symptoms with a rash. The rash may be red or purple and then turn into blisters or peeling of the skin. You may also notice a red rash with swelling of the face, lips, or lymph nodes in your neck or under your arms. Tell your care team right away if you have any change in your eyesight. Talk to your care team if you are pregnant or think you might be pregnant. A negative pregnancy test is required before starting this medication. A reliable form of contraception is recommended while taking this medication and for 5 months after the last dose. Talk to your care team about effective forms of contraception. Do not breast-feed while taking this medication and for 5 months after the last dose. What side effects may I notice from receiving this medication? Side effects that you should report to your care team as soon as possible: Allergic reactions--skin rash, itching, hives, swelling of the face, lips, tongue, or throat Dry cough, shortness of breath or trouble breathing Eye pain, redness, irritation, or discharge with blurry or decreased vision Heart muscle inflammation--unusual weakness or fatigue, shortness of breath, chest pain, fast or irregular heartbeat, dizziness, swelling of the ankles, feet, or hands Hormone   gland problems--headache, sensitivity to light, unusual weakness or fatigue, dizziness, fast or irregular heartbeat, increased sensitivity to cold or heat, excessive sweating, constipation, hair loss, increased thirst or amount of urine,  tremors or shaking, irritability Infusion reactions--chest pain, shortness of breath or trouble breathing, feeling faint or lightheaded Kidney injury (glomerulonephritis)--decrease in the amount of urine, red or dark brown urine, foamy or bubbly urine, swelling of the ankles, hands, or feet Liver injury--right upper belly pain, loss of appetite, nausea, light-colored stool, dark yellow or brown urine, yellowing skin or eyes, unusual weakness or fatigue Pain, tingling, or numbness in the hands or feet, muscle weakness, change in vision, confusion or trouble speaking, loss of balance or coordination, trouble walking, seizures Rash, fever, and swollen lymph nodes Redness, blistering, peeling, or loosening of the skin, including inside the mouth Sudden or severe stomach pain, bloody diarrhea, fever, nausea, vomiting Side effects that usually do not require medical attention (report these to your care team if they continue or are bothersome): Bone, joint, or muscle pain Diarrhea Fatigue Loss of appetite Nausea Skin rash This list may not describe all possible side effects. Call your doctor for medical advice about side effects. You may report side effects to FDA at 1-800-FDA-1088. Where should I keep my medication? This medication is given in a hospital or clinic. It will not be stored at home. NOTE: This sheet is a summary. It may not cover all possible information. If you have questions about this medicine, talk to your doctor, pharmacist, or health care provider.  2023 Elsevier/Gold Standard (2020-12-16 00:00:00)  

## 2021-11-09 ENCOUNTER — Other Ambulatory Visit: Payer: Self-pay

## 2021-11-10 ENCOUNTER — Other Ambulatory Visit: Payer: Self-pay

## 2021-12-03 ENCOUNTER — Other Ambulatory Visit: Payer: Self-pay

## 2021-12-04 ENCOUNTER — Encounter (HOSPITAL_COMMUNITY)
Admission: RE | Admit: 2021-12-04 | Discharge: 2021-12-04 | Disposition: A | Discharge status: Home / Self Care | Payer: Medicare Other | Source: Ambulatory Visit | Attending: Hematology & Oncology | Admitting: Hematology & Oncology

## 2021-12-04 DIAGNOSIS — C641 Malignant neoplasm of right kidney, except renal pelvis: Secondary | ICD-10-CM | POA: Diagnosis present

## 2021-12-04 LAB — GLUCOSE, CAPILLARY: Glucose-Capillary: 86 mg/dL (ref 70–99)

## 2021-12-04 MED ORDER — FLUDEOXYGLUCOSE F - 18 (FDG) INJECTION
12.0000 | Freq: Once | INTRAVENOUS | Status: AC
  Administered 2021-12-04: 12 via INTRAVENOUS

## 2021-12-05 ENCOUNTER — Encounter: Payer: Self-pay | Admitting: *Deleted

## 2021-12-06 ENCOUNTER — Ambulatory Visit: Payer: Medicare Other

## 2021-12-06 ENCOUNTER — Ambulatory Visit: Payer: Medicare Other | Admitting: Hematology & Oncology

## 2021-12-06 ENCOUNTER — Inpatient Hospital Stay: Payer: Medicare Other

## 2021-12-13 ENCOUNTER — Inpatient Hospital Stay (HOSPITAL_BASED_OUTPATIENT_CLINIC_OR_DEPARTMENT_OTHER): Payer: Medicare Other | Admitting: Hematology & Oncology

## 2021-12-13 ENCOUNTER — Inpatient Hospital Stay: Payer: Medicare Other | Attending: Hematology & Oncology

## 2021-12-13 ENCOUNTER — Encounter: Payer: Self-pay | Admitting: Hematology & Oncology

## 2021-12-13 ENCOUNTER — Inpatient Hospital Stay: Payer: Medicare Other

## 2021-12-13 VITALS — BP 92/56 | HR 75 | Temp 98.2°F | Resp 18

## 2021-12-13 VITALS — BP 88/55 | HR 74 | Temp 98.0°F | Resp 20 | Ht 75.0 in | Wt 241.0 lb

## 2021-12-13 DIAGNOSIS — C641 Malignant neoplasm of right kidney, except renal pelvis: Secondary | ICD-10-CM

## 2021-12-13 DIAGNOSIS — C7951 Secondary malignant neoplasm of bone: Secondary | ICD-10-CM | POA: Diagnosis present

## 2021-12-13 DIAGNOSIS — Z5112 Encounter for antineoplastic immunotherapy: Secondary | ICD-10-CM | POA: Insufficient documentation

## 2021-12-13 LAB — CMP (CANCER CENTER ONLY)
ALT: 16 U/L (ref 0–44)
AST: 10 U/L — ABNORMAL LOW (ref 15–41)
Albumin: 3.7 g/dL (ref 3.5–5.0)
Alkaline Phosphatase: 102 U/L (ref 38–126)
Anion gap: 5 (ref 5–15)
BUN: 27 mg/dL — ABNORMAL HIGH (ref 8–23)
CO2: 26 mmol/L (ref 22–32)
Calcium: 10.2 mg/dL (ref 8.9–10.3)
Chloride: 111 mmol/L (ref 98–111)
Creatinine: 0.96 mg/dL (ref 0.61–1.24)
GFR, Estimated: >60 mL/min (ref >60)
Glucose, Bld: 118 mg/dL — ABNORMAL HIGH (ref 70–99)
Potassium: 3.8 mmol/L (ref 3.5–5.1)
Sodium: 142 mmol/L (ref 135–145)
Total Bilirubin: 0.4 mg/dL (ref 0.3–1.2)
Total Protein: 5.8 g/dL — ABNORMAL LOW (ref 6.5–8.1)

## 2021-12-13 LAB — CBC WITH DIFFERENTIAL (CANCER CENTER ONLY)
Abs Immature Granulocytes: 0.01 10*3/uL (ref 0.00–0.07)
Basophils Absolute: 0 10*3/uL (ref 0.0–0.1)
Basophils Relative: 1 %
Eosinophils Absolute: 0.1 10*3/uL (ref 0.0–0.5)
Eosinophils Relative: 2 %
HCT: 40 % (ref 39.0–52.0)
Hemoglobin: 13.2 g/dL (ref 13.0–17.0)
Immature Granulocytes: 0 %
Lymphocytes Relative: 29 %
Lymphs Abs: 1.3 10*3/uL (ref 0.7–4.0)
MCH: 31.5 pg (ref 26.0–34.0)
MCHC: 33 g/dL (ref 30.0–36.0)
MCV: 95.5 fL (ref 80.0–100.0)
Monocytes Absolute: 0.5 10*3/uL (ref 0.1–1.0)
Monocytes Relative: 11 %
Neutro Abs: 2.5 10*3/uL (ref 1.7–7.7)
Neutrophils Relative %: 57 %
Platelet Count: 173 10*3/uL (ref 150–400)
RBC: 4.19 MIL/uL — ABNORMAL LOW (ref 4.22–5.81)
RDW: 12.9 % (ref 11.5–15.5)
WBC Count: 4.5 10*3/uL (ref 4.0–10.5)
nRBC: 0 % (ref 0.0–0.2)

## 2021-12-13 LAB — LACTATE DEHYDROGENASE: LDH: 136 U/L (ref 98–192)

## 2021-12-13 MED ORDER — SODIUM CHLORIDE 0.9 % IV SOLN: Freq: Once | INTRAVENOUS | Status: AC

## 2021-12-13 MED ORDER — SODIUM CHLORIDE 0.9 % IV SOLN
480.0000 mg | Freq: Once | INTRAVENOUS | Status: AC
  Administered 2021-12-13: 480 mg via INTRAVENOUS
  Filled 2021-12-13: qty 48

## 2021-12-13 NOTE — Progress Notes (Signed)
Hematology and Oncology Follow Up Visit  Francis Cunningham [id number] 12/22/1950 71 y.o. 12/13/2021   Principle Diagnosis:  Metastatic renal cell carcinoma-bone only metastasis  Current Therapy:   Maintenance nivolumab-Q 4-week dosing -- s/p cycle #19     Interim History:  Francis Cunningham is back for follow-up.  He comes in with his wife.  They both are doing quite well.  He is going need cataract surgery.  I think he will meet with the ophthalmologist in January.  I do not see a problem with him having cataract surgery.  The nivolumab should have no effect on this.  Otherwise, he is doing well.  His PET scan that was done last week did not show any evidence of recurrent/progressive kidney cancer.  He has had no fever.  He has had no problems with cough or shortness of breath.  He has had no change in bowel or bladder habits.  He has had no problems with bony pain.  His right hip does bother him on occasion.  He has had no rashes.  There is been no bleeding.  Overall, I would say his performance status is probably ECOG 1.    Medications:  Current Outpatient Medications:    alfuzosin (UROXATRAL) 10 MG 24 hr tablet, Take 10 mg by mouth at bedtime., Disp: , Rfl:    ascorbic acid (VITAMIN C) 500 MG tablet, Take 500 mg by mouth in the morning., Disp: , Rfl:    Cholecalciferol 25 MCG (1000 UT) tablet, Take 2,000 Units by mouth daily., Disp: , Rfl:    escitalopram (LEXAPRO) 20 MG tablet, Take 20 mg by mouth daily., Disp: , Rfl:    finasteride (PROSCAR) 5 MG tablet, Take 5 mg by mouth daily., Disp: , Rfl:    guaiFENesin (MUCINEX) 600 MG 12 hr tablet, Take 600 mg by mouth in the morning and at bedtime., Disp: , Rfl:    Hypertonic Nasal Wash (SINUS RINSE) PACK, Irrigate with as directed., Disp: , Rfl:    Magnesium Carbonate POWD, Take 325 mg by mouth daily. 1 1/2  teaspoons daily., Disp: , Rfl:    melatonin 3 MG TABS tablet, Take 3 mg by mouth at bedtime., Disp: , Rfl:    midodrine  (PROAMATINE) 2.5 MG tablet, TAKE ONE (1) TABLET BY MOUTH 3 TIMES DAILY, Disp: 90 tablet, Rfl: 8   montelukast (SINGULAIR) 10 MG tablet, Take 1 tablet by mouth daily., Disp: , Rfl:    Multiple Vitamin (MULTI-VITAMINS) TABS, Take 1 tablet by mouth daily., Disp: , Rfl:    nivolumab (OPDIVO) 100 MG/10ML SOLN chemo injection, Inject into the vein., Disp: , Rfl:    Omega-3 Fatty Acids (FISH OIL PO), Take 5 mLs by mouth daily., Disp: , Rfl:    polyethylene glycol powder (GLYCOLAX/MIRALAX) 17 GM/SCOOP powder, Take 17 g by mouth at bedtime., Disp: , Rfl:    Potassium Citrate-Citric Acid 3300-1002 MG PACK, Take by mouth daily., Disp: , Rfl:    predniSONE (DELTASONE) 10 MG tablet, Take 1 tablet (10 mg total) by mouth daily., Disp: 90 tablet, Rfl: 6   Probiotic Product (PROBIOTIC ACIDOPHILUS BEADS PO), Take by mouth daily., Disp: , Rfl:    vitamin E 180 MG (400 UNITS) capsule, Take 400 Units by mouth every morning., Disp: , Rfl:    LORazepam (ATIVAN) 1 MG tablet, Take 1 mg by mouth as needed. Ona tablest as needed for procedures and imaging (Patient not taking: Reported on 12/13/2021), Disp: , Rfl:    propranolol (INDERAL)  10 MG tablet, Take 10 mg by mouth daily as needed. For anxiety (Patient not taking: Reported on 12/13/2021), Disp: , Rfl:   Allergies:  Allergies  Allergen Reactions   Morphine Nausea Only    Past Medical History, Surgical history, Social history, and Family History were reviewed and updated.  Review of Systems: Review of Systems  Constitutional: Negative.   HENT:   Positive for mouth sores.   Eyes: Negative.   Respiratory: Negative.    Cardiovascular: Negative.   Gastrointestinal: Negative.   Endocrine: Negative.   Genitourinary: Negative.    Musculoskeletal:  Positive for arthralgias and back pain.  Skin: Negative.   Neurological: Negative.   Hematological: Negative.   Psychiatric/Behavioral: Negative.      Physical Exam:  height is '6\' 3"'$  (1.905 m) and weight is 241 lb  (109.3 kg). His oral temperature is 98 F (36.7 C). His blood pressure is 88/55 (abnormal) and his pulse is 74. His respiration is 20 and oxygen saturation is 98%.   Wt Readings from Last 3 Encounters:  12/13/21 241 lb (109.3 kg)  11/08/21 240 lb (108.9 kg)  10/12/21 239 lb 1.9 oz (108.5 kg)    Physical Exam Vitals reviewed.  HENT:     Head: Normocephalic and atraumatic.  Eyes:     Pupils: Pupils are equal, round, and reactive to light.  Cardiovascular:     Rate and Rhythm: Normal rate and regular rhythm.     Heart sounds: Normal heart sounds.  Pulmonary:     Effort: Pulmonary effort is normal.     Breath sounds: Normal breath sounds.  Abdominal:     General: Bowel sounds are normal.     Palpations: Abdomen is soft.  Musculoskeletal:        General: No tenderness or deformity. Normal range of motion.     Cervical back: Normal range of motion.  Lymphadenopathy:     Cervical: No cervical adenopathy.  Skin:    General: Skin is warm and dry.     Findings: No erythema or rash.  Neurological:     Mental Status: He is alert and oriented to person, place, and time.  Psychiatric:        Behavior: Behavior normal.        Thought Content: Thought content normal.        Judgment: Judgment normal.     Lab Results  Component Value Date   WBC 4.5 12/13/2021   HGB 13.2 12/13/2021   HCT 40.0 12/13/2021   MCV 95.5 12/13/2021   PLT 173 12/13/2021     Chemistry      Component Value Date/Time   NA 141 11/08/2021 0804   K 3.4 (L) 11/08/2021 0804   CL 109 11/08/2021 0804   CO2 24 11/08/2021 0804   BUN 26 (H) 11/08/2021 0804   CREATININE 0.96 11/08/2021 0804      Component Value Date/Time   CALCIUM 10.7 (H) 11/08/2021 0804   ALKPHOS 104 11/08/2021 0804   AST 10 (L) 11/08/2021 0804   ALT 15 11/08/2021 0804   BILITOT 0.5 11/08/2021 0804      Impression and Plan: Francis Cunningham is a very nice 71 year old white male.  He has metastatic renal cell carcinoma.  Thankfully, he is  continuing to respond to immunotherapy.   The PET scan is quite reassuring.  There is no active disease on the PET scan.  As such, I do not think we need another PET scan until probably February or even March  of next year.  Again, I do not see a problem with him having cataract surgery.  We will go ahead with treatment today.  We will still treat him every month.  If everything continues to look stable, maybe we can move his treatments out to every 6 weeks.    Volanda Napoleon, MD 11/15/20238:27 AM

## 2021-12-13 NOTE — Patient Instructions (Signed)
[facility]  Discharge Instructions: Thank you for choosing [facility] to provide your oncology and hematology care.   If you have a lab appointment with the Cancer Center, please go directly to the Cancer Center and check in at the registration area.  Wear comfortable clothing and clothing appropriate for easy access to any Portacath or PICC line.   We strive to give you quality time with your provider. You may need to reschedule your appointment if you arrive late (15 or more minutes).  Arriving late affects you and other patients whose appointments are after yours.  Also, if you miss three or more appointments without notifying the office, you may be dismissed from the clinic at the provider's discretion.      For prescription refill requests, have your pharmacy contact our office and allow 72 hours for refills to be completed.    Today you received the following chemotherapy and/or immunotherapy agents Opdivo      To help prevent nausea and vomiting after your treatment, we encourage you to take your nausea medication as directed.  BELOW ARE SYMPTOMS THAT SHOULD BE REPORTED IMMEDIATELY: *FEVER GREATER THAN 100.4 F (38 C) OR HIGHER *CHILLS OR SWEATING *NAUSEA AND VOMITING THAT IS NOT CONTROLLED WITH YOUR NAUSEA MEDICATION *UNUSUAL SHORTNESS OF BREATH *UNUSUAL BRUISING OR BLEEDING *URINARY PROBLEMS (pain or burning when urinating, or frequent urination) *BOWEL PROBLEMS (unusual diarrhea, constipation, pain near the anus) TENDERNESS IN MOUTH AND THROAT WITH OR WITHOUT PRESENCE OF ULCERS (sore throat, sores in mouth, or a toothache) UNUSUAL RASH, SWELLING OR PAIN  UNUSUAL VAGINAL DISCHARGE OR ITCHING   Items with * indicate a potential emergency and should be followed up as soon as possible or go to the Emergency Department if any problems should occur.  Please show the CHEMOTHERAPY ALERT CARD or IMMUNOTHERAPY ALERT CARD at check-in to the  Emergency Department and triage nurse. Should you have questions after your visit or need to cancel or reschedule your appointment, please contact [facility]  [phone] and follow the prompts.  Office hours are 8:00 a.m. to 4:30 p.m. Monday - Friday. Please note that voicemails left after 4:00 p.m. may not be returned until the following business day.  We are closed weekends and major holidays. You have access to a nurse at all times for urgent questions. Please call the main number to the clinic [phone] and follow the prompts.  For any non-urgent questions, you may also contact your provider using MyChart. We now offer e-Visits for anyone 18 and older to request care online for non-urgent symptoms. For details visit [email or web address].   Also download the MyChart app! Go to the app store, search "MyChart", open the app, select [facility], and log in with your MyChart username and password.  Masks are optional in the cancer centers. If you would like for your care team to wear a mask while they are taking care of you, please let them know. You may have one support person who is at least 73 years old accompany you for your appointments. 

## 2021-12-14 ENCOUNTER — Other Ambulatory Visit: Payer: Self-pay

## 2021-12-18 ENCOUNTER — Other Ambulatory Visit: Payer: Self-pay

## 2021-12-22 ENCOUNTER — Other Ambulatory Visit: Payer: Self-pay

## 2022-01-03 ENCOUNTER — Ambulatory Visit: Payer: Medicare Other

## 2022-01-03 ENCOUNTER — Other Ambulatory Visit: Payer: Medicare Other

## 2022-01-03 ENCOUNTER — Ambulatory Visit: Payer: Medicare Other | Admitting: Hematology & Oncology

## 2022-01-10 ENCOUNTER — Inpatient Hospital Stay (HOSPITAL_BASED_OUTPATIENT_CLINIC_OR_DEPARTMENT_OTHER): Payer: Medicare Other | Admitting: Hematology & Oncology

## 2022-01-10 ENCOUNTER — Inpatient Hospital Stay: Payer: Medicare Other | Attending: Hematology & Oncology

## 2022-01-10 ENCOUNTER — Inpatient Hospital Stay: Payer: Medicare Other

## 2022-01-10 ENCOUNTER — Encounter: Payer: Self-pay | Admitting: Hematology & Oncology

## 2022-01-10 ENCOUNTER — Other Ambulatory Visit: Payer: Self-pay

## 2022-01-10 VITALS — BP 112/66 | HR 84 | Temp 97.8°F | Resp 18 | Ht 75.0 in | Wt 240.0 lb

## 2022-01-10 DIAGNOSIS — M7989 Other specified soft tissue disorders: Secondary | ICD-10-CM | POA: Diagnosis not present

## 2022-01-10 DIAGNOSIS — C641 Malignant neoplasm of right kidney, except renal pelvis: Secondary | ICD-10-CM | POA: Insufficient documentation

## 2022-01-10 DIAGNOSIS — C7951 Secondary malignant neoplasm of bone: Secondary | ICD-10-CM | POA: Insufficient documentation

## 2022-01-10 DIAGNOSIS — R93819 Abnormal radiologic findings on diagnostic imaging of unspecified testicle: Secondary | ICD-10-CM

## 2022-01-10 DIAGNOSIS — Z5112 Encounter for antineoplastic immunotherapy: Secondary | ICD-10-CM | POA: Insufficient documentation

## 2022-01-10 LAB — CMP (CANCER CENTER ONLY)
ALT: 16 U/L (ref 0–44)
AST: 11 U/L — ABNORMAL LOW (ref 15–41)
Albumin: 3.9 g/dL (ref 3.5–5.0)
Alkaline Phosphatase: 105 U/L (ref 38–126)
Anion gap: 8 (ref 5–15)
BUN: 24 mg/dL — ABNORMAL HIGH (ref 8–23)
CO2: 24 mmol/L (ref 22–32)
Calcium: 10.1 mg/dL (ref 8.9–10.3)
Chloride: 110 mmol/L (ref 98–111)
Creatinine: 0.94 mg/dL (ref 0.61–1.24)
GFR, Estimated: >60 mL/min (ref >60)
Glucose, Bld: 109 mg/dL — ABNORMAL HIGH (ref 70–99)
Potassium: 3.6 mmol/L (ref 3.5–5.1)
Sodium: 142 mmol/L (ref 135–145)
Total Bilirubin: 0.6 mg/dL (ref 0.3–1.2)
Total Protein: 6.1 g/dL — ABNORMAL LOW (ref 6.5–8.1)

## 2022-01-10 LAB — CBC WITH DIFFERENTIAL (CANCER CENTER ONLY)
Abs Immature Granulocytes: 0.01 10*3/uL (ref 0.00–0.07)
Basophils Absolute: 0 10*3/uL (ref 0.0–0.1)
Basophils Relative: 1 %
Eosinophils Absolute: 0.1 10*3/uL (ref 0.0–0.5)
Eosinophils Relative: 3 %
HCT: 40.7 % (ref 39.0–52.0)
Hemoglobin: 13.4 g/dL (ref 13.0–17.0)
Immature Granulocytes: 0 %
Lymphocytes Relative: 36 %
Lymphs Abs: 1.4 10*3/uL (ref 0.7–4.0)
MCH: 31.5 pg (ref 26.0–34.0)
MCHC: 32.9 g/dL (ref 30.0–36.0)
MCV: 95.8 fL (ref 80.0–100.0)
Monocytes Absolute: 0.5 10*3/uL (ref 0.1–1.0)
Monocytes Relative: 13 %
Neutro Abs: 1.8 10*3/uL (ref 1.7–7.7)
Neutrophils Relative %: 47 %
Platelet Count: 169 10*3/uL (ref 150–400)
RBC: 4.25 MIL/uL (ref 4.22–5.81)
RDW: 13 % (ref 11.5–15.5)
WBC Count: 3.8 10*3/uL — ABNORMAL LOW (ref 4.0–10.5)
nRBC: 0 % (ref 0.0–0.2)

## 2022-01-10 LAB — LACTATE DEHYDROGENASE: LDH: 136 U/L (ref 98–192)

## 2022-01-10 MED ORDER — SODIUM CHLORIDE 0.9 % IV SOLN
480.0000 mg | Freq: Once | INTRAVENOUS | Status: AC
  Administered 2022-01-10: 480 mg via INTRAVENOUS
  Filled 2022-01-10: qty 48

## 2022-01-10 MED ORDER — SODIUM CHLORIDE 0.9 % IV SOLN: Freq: Once | INTRAVENOUS | Status: AC

## 2022-01-10 NOTE — Patient Instructions (Signed)
Nivolumab Injection What is this medication? NIVOLUMAB (nye VOL ue mab) treats some types of cancer. It works by helping your immune system slow or stop the spread of cancer cells. It is a monoclonal antibody. This medicine may be used for other purposes; ask your health care provider or pharmacist if you have questions. COMMON BRAND NAME(S): Opdivo What should I tell my care team before I take this medication? They need to know if you have any of these conditions: Allogeneic stem cell transplant (uses someone else's stem cells) Autoimmune diseases, such as Crohn disease, ulcerative colitis, lupus History of chest radiation Nervous system problems, such as Guillain-Barre syndrome or myasthenia gravis Organ transplant An unusual or allergic reaction to nivolumab, other medications, foods, dyes, or preservatives Pregnant or trying to get pregnant Breast-feeding How should I use this medication? This medication is infused into a vein. It is given in a hospital or clinic setting. A special MedGuide will be given to you before each treatment. Be sure to read this information carefully each time. Talk to your care team about the use of this medication in children. While it may be prescribed for children as young as 12 years for selected conditions, precautions do apply. Overdosage: If you think you have taken too much of this medicine contact a poison control center or emergency room at once. NOTE: This medicine is only for you. Do not share this medicine with others. What if I miss a dose? Keep appointments for follow-up doses. It is important not to miss your dose. Call your care team if you are unable to keep an appointment. What may interact with this medication? Interactions have not been studied. This list may not describe all possible interactions. Give your health care provider a list of all the medicines, herbs, non-prescription drugs, or dietary supplements you use. Also tell them if you  smoke, drink alcohol, or use illegal drugs. Some items may interact with your medicine. What should I watch for while using this medication? Your condition will be monitored carefully while you are receiving this medication. You may need blood work while taking this medication. This medication may cause serious skin reactions. They can happen weeks to months after starting the medication. Contact your care team right away if you notice fevers or flu-like symptoms with a rash. The rash may be red or purple and then turn into blisters or peeling of the skin. You may also notice a red rash with swelling of the face, lips, or lymph nodes in your neck or under your arms. Tell your care team right away if you have any change in your eyesight. Talk to your care team if you are pregnant or think you might be pregnant. A negative pregnancy test is required before starting this medication. A reliable form of contraception is recommended while taking this medication and for 5 months after the last dose. Talk to your care team about effective forms of contraception. Do not breast-feed while taking this medication and for 5 months after the last dose. What side effects may I notice from receiving this medication? Side effects that you should report to your care team as soon as possible: Allergic reactions--skin rash, itching, hives, swelling of the face, lips, tongue, or throat Dry cough, shortness of breath or trouble breathing Eye pain, redness, irritation, or discharge with blurry or decreased vision Heart muscle inflammation--unusual weakness or fatigue, shortness of breath, chest pain, fast or irregular heartbeat, dizziness, swelling of the ankles, feet, or hands Hormone   gland problems--headache, sensitivity to light, unusual weakness or fatigue, dizziness, fast or irregular heartbeat, increased sensitivity to cold or heat, excessive sweating, constipation, hair loss, increased thirst or amount of urine,  tremors or shaking, irritability Infusion reactions--chest pain, shortness of breath or trouble breathing, feeling faint or lightheaded Kidney injury (glomerulonephritis)--decrease in the amount of urine, red or dark brown urine, foamy or bubbly urine, swelling of the ankles, hands, or feet Liver injury--right upper belly pain, loss of appetite, nausea, light-colored stool, dark yellow or brown urine, yellowing skin or eyes, unusual weakness or fatigue Pain, tingling, or numbness in the hands or feet, muscle weakness, change in vision, confusion or trouble speaking, loss of balance or coordination, trouble walking, seizures Rash, fever, and swollen lymph nodes Redness, blistering, peeling, or loosening of the skin, including inside the mouth Sudden or severe stomach pain, bloody diarrhea, fever, nausea, vomiting Side effects that usually do not require medical attention (report these to your care team if they continue or are bothersome): Bone, joint, or muscle pain Diarrhea Fatigue Loss of appetite Nausea Skin rash This list may not describe all possible side effects. Call your doctor for medical advice about side effects. You may report side effects to FDA at 1-800-FDA-1088. Where should I keep my medication? This medication is given in a hospital or clinic. It will not be stored at home. NOTE: This sheet is a summary. It may not cover all possible information. If you have questions about this medicine, talk to your doctor, pharmacist, or health care provider.  2023 Elsevier/Gold Standard (2021-05-15 00:00:00)  

## 2022-01-10 NOTE — Progress Notes (Signed)
Hematology and Oncology Follow Up Visit  Francis Cunningham [id number] 1950-07-02 71 y.o. 01/10/2022   Principle Diagnosis:  Metastatic renal cell carcinoma-bone only metastasis  Current Therapy:   Maintenance nivolumab-Q 4-week dosing -- s/p cycle #20     Interim History:  Francis Cunningham is back for follow-up.  Comes in by himself.  Francis wife is at a business meeting.  He is doing okay.  He really has not no complaints.  He and Francis wife had a wonderful Thanksgiving.  It was a quiet Thanksgiving at their house.  He has had no problems with cough or shortness of breath.  He has had no problems with bony pain.  He has had no change in bowel or bladder habits.  He has had no rashes.  He has a little bit of leg swelling but this is chronic.  Of note, Francis last TSH was 1.4 back in July.  He is wife both are going to need cataract surgery.  It sounds like this might be done in January.  He has had no problems with headache.  He has had no fever.  Overall, I would say Francis performance status is probably ECOG 0.   Medications:  Current Outpatient Medications:    alfuzosin (UROXATRAL) 10 MG 24 hr tablet, Take 10 mg by mouth at bedtime., Disp: , Rfl:    ascorbic acid (VITAMIN C) 500 MG tablet, Take 500 mg by mouth in the morning., Disp: , Rfl:    Cholecalciferol 25 MCG (1000 UT) tablet, Take 2,000 Units by mouth daily., Disp: , Rfl:    escitalopram (LEXAPRO) 20 MG tablet, Take 20 mg by mouth daily., Disp: , Rfl:    finasteride (PROSCAR) 5 MG tablet, Take 5 mg by mouth daily., Disp: , Rfl:    guaiFENesin (MUCINEX) 600 MG 12 hr tablet, Take 600 mg by mouth in the morning and at bedtime., Disp: , Rfl:    Hypertonic Nasal Wash (SINUS RINSE) PACK, Irrigate with as directed., Disp: , Rfl:    LORazepam (ATIVAN) 1 MG tablet, Take 1 mg by mouth as needed. Ona tablest as needed for procedures and imaging (Patient not taking: Reported on 12/13/2021), Disp: , Rfl:    Magnesium Carbonate POWD, Take 325 mg  by mouth daily. 1 1/2  teaspoons daily., Disp: , Rfl:    melatonin 3 MG TABS tablet, Take 3 mg by mouth at bedtime., Disp: , Rfl:    midodrine (PROAMATINE) 2.5 MG tablet, TAKE ONE (1) TABLET BY MOUTH 3 TIMES DAILY, Disp: 90 tablet, Rfl: 8   montelukast (SINGULAIR) 10 MG tablet, Take 1 tablet by mouth daily., Disp: , Rfl:    Multiple Vitamin (MULTI-VITAMINS) TABS, Take 1 tablet by mouth daily., Disp: , Rfl:    nivolumab (OPDIVO) 100 MG/10ML SOLN chemo injection, Inject into the vein., Disp: , Rfl:    Omega-3 Fatty Acids (FISH OIL PO), Take 5 mLs by mouth daily., Disp: , Rfl:    polyethylene glycol powder (GLYCOLAX/MIRALAX) 17 GM/SCOOP powder, Take 17 g by mouth at bedtime., Disp: , Rfl:    Potassium Citrate-Citric Acid 3300-1002 MG PACK, Take by mouth daily., Disp: , Rfl:    predniSONE (DELTASONE) 10 MG tablet, Take 1 tablet (10 mg total) by mouth daily., Disp: 90 tablet, Rfl: 6   Probiotic Product (PROBIOTIC ACIDOPHILUS BEADS PO), Take by mouth daily., Disp: , Rfl:    propranolol (INDERAL) 10 MG tablet, Take 10 mg by mouth daily as needed. For anxiety (Patient not taking: Reported  on 12/13/2021), Disp: , Rfl:    vitamin E 180 MG (400 UNITS) capsule, Take 400 Units by mouth every morning., Disp: , Rfl:   Allergies:  Allergies  Allergen Reactions   Morphine Nausea Only    Past Medical History, Surgical history, Social history, and Family History were reviewed and updated.  Review of Systems: Review of Systems  Constitutional: Negative.   HENT:   Positive for mouth sores.   Eyes: Negative.   Respiratory: Negative.    Cardiovascular: Negative.   Gastrointestinal: Negative.   Endocrine: Negative.   Genitourinary: Negative.    Musculoskeletal:  Positive for arthralgias and back pain.  Skin: Negative.   Neurological: Negative.   Hematological: Negative.   Psychiatric/Behavioral: Negative.      Physical Exam:  height is '6\' 3"'$  (1.905 m) and weight is 240 lb (108.9 kg). Francis oral  temperature is 97.8 F (36.6 C). Francis blood pressure is 112/66 and Francis pulse is 84. Francis respiration is 18 and oxygen saturation is 99%.   Wt Readings from Last 3 Encounters:  01/10/22 240 lb (108.9 kg)  12/13/21 241 lb (109.3 kg)  11/08/21 240 lb (108.9 kg)    Physical Exam Vitals reviewed.  HENT:     Head: Normocephalic and atraumatic.  Eyes:     Pupils: Pupils are equal, round, and reactive to light.  Cardiovascular:     Rate and Rhythm: Normal rate and regular rhythm.     Heart sounds: Normal heart sounds.  Pulmonary:     Effort: Pulmonary effort is normal.     Breath sounds: Normal breath sounds.  Abdominal:     General: Bowel sounds are normal.     Palpations: Abdomen is soft.  Musculoskeletal:        General: No tenderness or deformity. Normal range of motion.     Cervical back: Normal range of motion.  Lymphadenopathy:     Cervical: No cervical adenopathy.  Skin:    General: Skin is warm and dry.     Findings: No erythema or rash.  Neurological:     Mental Status: He is alert and oriented to person, place, and time.  Psychiatric:        Behavior: Behavior normal.        Thought Content: Thought content normal.        Judgment: Judgment normal.      Lab Results  Component Value Date   WBC 3.8 (L) 01/10/2022   HGB 13.4 01/10/2022   HCT 40.7 01/10/2022   MCV 95.8 01/10/2022   PLT 169 01/10/2022     Chemistry      Component Value Date/Time   NA 142 01/10/2022 0806   K 3.6 01/10/2022 0806   CL 110 01/10/2022 0806   CO2 24 01/10/2022 0806   BUN 24 (H) 01/10/2022 0806   CREATININE 0.94 01/10/2022 0806      Component Value Date/Time   CALCIUM 10.1 01/10/2022 0806   ALKPHOS 105 01/10/2022 0806   AST 11 (L) 01/10/2022 0806   ALT 16 01/10/2022 0806   BILITOT 0.6 01/10/2022 0806      Impression and Plan: Francis Cunningham is a very nice 71 year old white male.  He has metastatic renal cell carcinoma.  Thankfully, he is continuing to respond to immunotherapy.    So far, everything really looks great.  I do not see a problem with respect to the renal cell carcinoma coming back.  I know he will have a wonderful Christmas and new year.  He really has done well this year.  Will plan to get him back in 4 more weeks.  I think that if he continues to stabilize, we can try to move Francis treatments out to every 5 or 6 weeks.   Volanda Napoleon, MD 12/13/20239:03 AM

## 2022-01-11 ENCOUNTER — Other Ambulatory Visit: Payer: Self-pay

## 2022-01-14 ENCOUNTER — Other Ambulatory Visit: Payer: Self-pay

## 2022-02-07 ENCOUNTER — Inpatient Hospital Stay (HOSPITAL_BASED_OUTPATIENT_CLINIC_OR_DEPARTMENT_OTHER): Payer: Medicare Other | Admitting: Family

## 2022-02-07 ENCOUNTER — Inpatient Hospital Stay: Payer: Medicare Other

## 2022-02-07 ENCOUNTER — Encounter: Payer: Self-pay | Admitting: Family

## 2022-02-07 ENCOUNTER — Other Ambulatory Visit: Payer: Self-pay

## 2022-02-07 ENCOUNTER — Inpatient Hospital Stay: Payer: Medicare Other | Attending: Hematology & Oncology

## 2022-02-07 ENCOUNTER — Ambulatory Visit: Payer: Medicare Other

## 2022-02-07 VITALS — BP 104/66 | HR 67 | Temp 97.6°F | Resp 18 | Ht 75.0 in | Wt 238.0 lb

## 2022-02-07 VITALS — BP 102/55 | HR 60

## 2022-02-07 DIAGNOSIS — Z5112 Encounter for antineoplastic immunotherapy: Secondary | ICD-10-CM | POA: Insufficient documentation

## 2022-02-07 DIAGNOSIS — R200 Anesthesia of skin: Secondary | ICD-10-CM | POA: Diagnosis not present

## 2022-02-07 DIAGNOSIS — C641 Malignant neoplasm of right kidney, except renal pelvis: Secondary | ICD-10-CM

## 2022-02-07 DIAGNOSIS — R93819 Abnormal radiologic findings on diagnostic imaging of unspecified testicle: Secondary | ICD-10-CM

## 2022-02-07 DIAGNOSIS — C7951 Secondary malignant neoplasm of bone: Secondary | ICD-10-CM | POA: Diagnosis present

## 2022-02-07 DIAGNOSIS — Z79899 Other long term (current) drug therapy: Secondary | ICD-10-CM | POA: Diagnosis not present

## 2022-02-07 DIAGNOSIS — R202 Paresthesia of skin: Secondary | ICD-10-CM | POA: Diagnosis not present

## 2022-02-07 DIAGNOSIS — E032 Hypothyroidism due to medicaments and other exogenous substances: Secondary | ICD-10-CM

## 2022-02-07 LAB — CBC WITH DIFFERENTIAL (CANCER CENTER ONLY)
Abs Immature Granulocytes: 0.01 10*3/uL (ref 0.00–0.07)
Basophils Absolute: 0 10*3/uL (ref 0.0–0.1)
Basophils Relative: 1 %
Eosinophils Absolute: 0.1 10*3/uL (ref 0.0–0.5)
Eosinophils Relative: 2 %
HCT: 40.5 % (ref 39.0–52.0)
Hemoglobin: 13.3 g/dL (ref 13.0–17.0)
Immature Granulocytes: 0 %
Lymphocytes Relative: 35 %
Lymphs Abs: 1.3 10*3/uL (ref 0.7–4.0)
MCH: 31.5 pg (ref 26.0–34.0)
MCHC: 32.8 g/dL (ref 30.0–36.0)
MCV: 96 fL (ref 80.0–100.0)
Monocytes Absolute: 0.5 10*3/uL (ref 0.1–1.0)
Monocytes Relative: 12 %
Neutro Abs: 1.8 10*3/uL (ref 1.7–7.7)
Neutrophils Relative %: 50 %
Platelet Count: 169 10*3/uL (ref 150–400)
RBC: 4.22 MIL/uL (ref 4.22–5.81)
RDW: 13 % (ref 11.5–15.5)
WBC Count: 3.8 10*3/uL — ABNORMAL LOW (ref 4.0–10.5)
nRBC: 0 % (ref 0.0–0.2)

## 2022-02-07 LAB — CMP (CANCER CENTER ONLY)
ALT: 18 U/L (ref 0–44)
AST: 14 U/L — ABNORMAL LOW (ref 15–41)
Albumin: 3.3 g/dL — ABNORMAL LOW (ref 3.5–5.0)
Alkaline Phosphatase: 109 U/L (ref 38–126)
Anion gap: 6 (ref 5–15)
BUN: 27 mg/dL — ABNORMAL HIGH (ref 8–23)
CO2: 23 mmol/L (ref 22–32)
Calcium: 10 mg/dL (ref 8.9–10.3)
Chloride: 109 mmol/L (ref 98–111)
Creatinine: 0.87 mg/dL (ref 0.61–1.24)
GFR, Estimated: >60 mL/min (ref >60)
Glucose, Bld: 101 mg/dL — ABNORMAL HIGH (ref 70–99)
Potassium: 3.8 mmol/L (ref 3.5–5.1)
Sodium: 138 mmol/L (ref 135–145)
Total Bilirubin: 0.6 mg/dL (ref 0.3–1.2)
Total Protein: 5.9 g/dL — ABNORMAL LOW (ref 6.5–8.1)

## 2022-02-07 LAB — TSH: TSH: 1.863 u[IU]/mL (ref 0.350–4.500)

## 2022-02-07 LAB — LACTATE DEHYDROGENASE: LDH: 137 U/L (ref 98–192)

## 2022-02-07 MED ORDER — SODIUM CHLORIDE 0.9 % IV SOLN
480.0000 mg | Freq: Once | INTRAVENOUS | Status: AC
  Administered 2022-02-07: 480 mg via INTRAVENOUS
  Filled 2022-02-07: qty 48

## 2022-02-07 MED ORDER — SODIUM CHLORIDE 0.9 % IV SOLN: Freq: Once | INTRAVENOUS | Status: AC

## 2022-02-07 NOTE — Patient Instructions (Signed)
[facility] [place]  Discharge Instructions: Thank you for choosing [facility] to provide your oncology and hematology care.   If you have a lab appointment with the [facility], please go directly to the [facility] and check in at the registration area.  Wear comfortable clothing and clothing appropriate for easy access to any Portacath or PICC line.   We strive to give you quality time with your provider. You may need to reschedule your appointment if you arrive late (15 or more minutes).  Arriving late affects you and other patients whose appointments are after yours.  Also, if you miss three or more appointments without notifying the office, you may be dismissed from the clinic at the provider's discretion.      For prescription refill requests, have your pharmacy contact our office and allow 72 hours for refills to be completed.    Today you received the following chemotherapy and/or immunotherapy agents opdivo     To help prevent nausea and vomiting after your treatment, we encourage you to take your nausea medication as directed.  BELOW ARE SYMPTOMS THAT SHOULD BE REPORTED IMMEDIATELY: *FEVER GREATER THAN 100.4 F (38 C) OR HIGHER *CHILLS OR SWEATING *NAUSEA AND VOMITING THAT IS NOT CONTROLLED WITH YOUR NAUSEA MEDICATION *UNUSUAL SHORTNESS OF BREATH *UNUSUAL BRUISING OR BLEEDING *URINARY PROBLEMS (pain or burning when urinating, or frequent urination) *BOWEL PROBLEMS (unusual diarrhea, constipation, pain near the anus) TENDERNESS IN MOUTH AND THROAT WITH OR WITHOUT PRESENCE OF ULCERS (sore throat, sores in mouth, or a toothache) UNUSUAL RASH, SWELLING OR PAIN  UNUSUAL VAGINAL DISCHARGE OR ITCHING   Items with * indicate a potential emergency and should be followed up as soon as possible or go to the Emergency Department if any problems should occur.  Please show the CHEMOTHERAPY ALERT CARD or IMMUNOTHERAPY ALERT CARD at check-in to the  Emergency Department and triage nurse. Should you have questions after your visit or need to cancel or reschedule your appointment, please contact [facility]  [phone] and follow the prompts.  Office hours are 8:00 a.m. to 4:30 p.m. Monday - Friday. Please note that voicemails left after 4:00 p.m. may not be returned until the following business day.  We are closed weekends and major holidays. You have access to a nurse at all times for urgent questions. Please call the main number to the clinic [phone] and follow the prompts.  For any non-urgent questions, you may also contact your provider using MyChart. We now offer e-Visits for anyone 73 and older to request care online for non-urgent symptoms. For details visit [email or web address].   Also download the MyChart app! Go to the app store, search "MyChart", open the app, select [facility], and log in with your MyChart username and password.

## 2022-02-07 NOTE — Progress Notes (Signed)
Hematology and Oncology Follow Up Visit  Francis Cunningham Francis Cunningham [id number] 05/28/1950 72 y.o. 02/07/2022   Principle Diagnosis:  Metastatic renal cell carcinoma-bone only metastasis   Current Therapy:        Maintenance nivolumab-Q 4-week dosing -- s/p cycle 21   Interim History:  Francis Cunningham is here today with his lovely wife for follow-up. He is doing quite well and has no complaints at this time.  No issue with fatigue. He does enjoy an afternoon nap.  No fever, chills, n/v, cough, rash, dizziness, SOB, chest pain, palpitations, abdominal pain or changes in bowel or bladder habits.  No blood loss. No bruising or petechiae.  Mild puffiness in his ankles unchanged from baseline.  Numbness and tingling in toes comes and goes.  No falls or syncope reported.  Appetite and hydration are good. Weight is stable at 238 lbs.   ECOG Performance Status: 1 - Symptomatic but completely ambulatory  Medications:  Allergies as of 02/07/2022       Reactions   Morphine Nausea Only        Medication List        Accurate as of February 07, 2022  8:46 AM. If you have any questions, ask your nurse or doctor.          alfuzosin 10 MG 24 hr tablet Commonly known as: UROXATRAL Take 10 mg by mouth at bedtime.   ascorbic acid 500 MG tablet Commonly known as: VITAMIN C Take 500 mg by mouth in the morning.   Cholecalciferol 25 MCG (1000 UT) tablet Take 2,000 Units by mouth daily.   escitalopram 20 MG tablet Commonly known as: LEXAPRO Take 20 mg by mouth daily.   finasteride 5 MG tablet Commonly known as: PROSCAR Take 5 mg by mouth daily.   FISH OIL PO Take 5 mLs by mouth daily.   guaiFENesin 600 MG 12 hr tablet Commonly known as: MUCINEX Take 600 mg by mouth in the morning and at bedtime.   LORazepam 1 MG tablet Commonly known as: ATIVAN Take 1 mg by mouth as needed. Ona tablest as needed for procedures and imaging   Magnesium Carbonate Powd Take 325 mg by mouth daily. 1 1/2   teaspoons daily.   melatonin 3 MG Tabs tablet Take 3 mg by mouth at bedtime.   midodrine 2.5 MG tablet Commonly known as: PROAMATINE TAKE ONE (1) TABLET BY MOUTH 3 TIMES DAILY   montelukast 10 MG tablet Commonly known as: SINGULAIR Take 1 tablet by mouth daily.   Multi-Vitamins Tabs Take 1 tablet by mouth daily.   nivolumab 100 MG/10ML Soln chemo injection Commonly known as: OPDIVO Inject into the vein.   polyethylene glycol powder 17 GM/SCOOP powder Commonly known as: GLYCOLAX/MIRALAX Take 17 g by mouth at bedtime.   Potassium Citrate-Citric Acid 3300-1002 MG Pack Take by mouth daily.   predniSONE 10 MG tablet Commonly known as: DELTASONE Take 1 tablet (10 mg total) by mouth daily.   PROBIOTIC ACIDOPHILUS BEADS PO Take by mouth daily.   propranolol 10 MG tablet Commonly known as: INDERAL Take 10 mg by mouth daily as needed. For anxiety   Sinus Rinse Pack Irrigate with as directed.   vitamin E 180 MG (400 UNITS) capsule Take 400 Units by mouth every morning.        Allergies:  Allergies  Allergen Reactions   Morphine Nausea Only    Past Medical History, Surgical history, Social history, and Family History were reviewed and updated.  Review  of Systems: All other 10 point review of systems is negative.   Physical Exam:  height is '6\' 3"'$  (1.905 m) and weight is 238 lb (108 kg). His oral temperature is 97.6 F (36.4 C). His blood pressure is 104/66 and his pulse is 67. His respiration is 18 and oxygen saturation is 100%.   Wt Readings from Last 3 Encounters:  02/07/22 238 lb (108 kg)  01/10/22 240 lb (108.9 kg)  12/13/21 241 lb (109.3 kg)    Ocular: Sclerae unicteric, pupils equal, round and reactive to light Ear-nose-throat: Oropharynx clear, dentition fair Lymphatic: No cervical or supraclavicular adenopathy Lungs no rales or rhonchi, good excursion bilaterally Heart regular rate and rhythm, no murmur appreciated Abd soft, nontender, positive  bowel sounds MSK no focal spinal tenderness, no joint edema Neuro: non-focal, well-oriented, appropriate affect Breasts: Deferred   Lab Results  Component Value Date   WBC 3.8 (L) 02/07/2022   HGB 13.3 02/07/2022   HCT 40.5 02/07/2022   MCV 96.0 02/07/2022   PLT 169 02/07/2022   No results found for: "FERRITIN", "IRON", "TIBC", "UIBC", "IRONPCTSAT" Lab Results  Component Value Date   RBC 4.22 02/07/2022   No results found for: "KPAFRELGTCHN", "LAMBDASER", "KAPLAMBRATIO" No results found for: "IGGSERUM", "IGA", "IGMSERUM" No results found for: "TOTALPROTELP", "ALBUMINELP", "A1GS", "A2GS", "BETS", "BETA2SER", "GAMS", "MSPIKE", "SPEI"   Chemistry      Component Value Date/Time   NA 142 01/10/2022 0806   K 3.6 01/10/2022 0806   CL 110 01/10/2022 0806   CO2 24 01/10/2022 0806   BUN 24 (H) 01/10/2022 0806   CREATININE 0.94 01/10/2022 0806      Component Value Date/Time   CALCIUM 10.1 01/10/2022 0806   ALKPHOS 105 01/10/2022 0806   AST 11 (L) 01/10/2022 0806   ALT 16 01/10/2022 0806   BILITOT 0.6 01/10/2022 0806       Impression and Plan: Francis Cunningham is a very pleasant 72 yo caucasian gentleman with history of metastatic renal cell carcinoma.  He continues to do well on immunotherapy.  We will proceed with treatment today as planned.  Follow-up in 4 weeks.   Francis Dawson, NP 1/10/20248:46 AM

## 2022-02-08 ENCOUNTER — Other Ambulatory Visit: Payer: Self-pay

## 2022-02-09 ENCOUNTER — Other Ambulatory Visit: Payer: Self-pay

## 2022-02-21 ENCOUNTER — Other Ambulatory Visit (HOSPITAL_BASED_OUTPATIENT_CLINIC_OR_DEPARTMENT_OTHER): Payer: Self-pay

## 2022-02-21 ENCOUNTER — Other Ambulatory Visit: Payer: Self-pay

## 2022-02-21 MED ORDER — COMIRNATY 30 MCG/0.3ML IM SUSY
PREFILLED_SYRINGE | INTRAMUSCULAR | 0 refills | Status: DC
  Filled 2022-02-21: qty 0.3, 1d supply, fill #0

## 2022-03-07 ENCOUNTER — Inpatient Hospital Stay: Payer: Medicare Other

## 2022-03-07 ENCOUNTER — Inpatient Hospital Stay: Payer: Medicare Other | Attending: Hematology & Oncology

## 2022-03-07 ENCOUNTER — Inpatient Hospital Stay (HOSPITAL_BASED_OUTPATIENT_CLINIC_OR_DEPARTMENT_OTHER): Payer: Medicare Other | Admitting: Hematology & Oncology

## 2022-03-07 ENCOUNTER — Encounter: Payer: Self-pay | Admitting: Hematology & Oncology

## 2022-03-07 ENCOUNTER — Other Ambulatory Visit: Payer: Self-pay

## 2022-03-07 VITALS — BP 101/58 | HR 70 | Temp 98.0°F | Resp 17 | Ht 75.0 in | Wt 237.0 lb

## 2022-03-07 VITALS — BP 99/62 | HR 60 | Resp 18

## 2022-03-07 DIAGNOSIS — C641 Malignant neoplasm of right kidney, except renal pelvis: Secondary | ICD-10-CM

## 2022-03-07 DIAGNOSIS — C7951 Secondary malignant neoplasm of bone: Secondary | ICD-10-CM | POA: Diagnosis present

## 2022-03-07 DIAGNOSIS — Z5112 Encounter for antineoplastic immunotherapy: Secondary | ICD-10-CM | POA: Insufficient documentation

## 2022-03-07 DIAGNOSIS — Z79899 Other long term (current) drug therapy: Secondary | ICD-10-CM | POA: Insufficient documentation

## 2022-03-07 DIAGNOSIS — M7989 Other specified soft tissue disorders: Secondary | ICD-10-CM | POA: Diagnosis not present

## 2022-03-07 DIAGNOSIS — E032 Hypothyroidism due to medicaments and other exogenous substances: Secondary | ICD-10-CM

## 2022-03-07 LAB — CMP (CANCER CENTER ONLY)
ALT: 17 U/L (ref 0–44)
AST: 11 U/L — ABNORMAL LOW (ref 15–41)
Albumin: 3.9 g/dL (ref 3.5–5.0)
Alkaline Phosphatase: 120 U/L (ref 38–126)
Anion gap: 8 (ref 5–15)
BUN: 26 mg/dL — ABNORMAL HIGH (ref 8–23)
CO2: 25 mmol/L (ref 22–32)
Calcium: 10.7 mg/dL — ABNORMAL HIGH (ref 8.9–10.3)
Chloride: 109 mmol/L (ref 98–111)
Creatinine: 0.9 mg/dL (ref 0.61–1.24)
GFR, Estimated: >60 mL/min (ref >60)
Glucose, Bld: 115 mg/dL — ABNORMAL HIGH (ref 70–99)
Potassium: 3.6 mmol/L (ref 3.5–5.1)
Sodium: 142 mmol/L (ref 135–145)
Total Bilirubin: 0.5 mg/dL (ref 0.3–1.2)
Total Protein: 6.3 g/dL — ABNORMAL LOW (ref 6.5–8.1)

## 2022-03-07 LAB — LACTATE DEHYDROGENASE: LDH: 143 U/L (ref 98–192)

## 2022-03-07 LAB — CBC WITH DIFFERENTIAL (CANCER CENTER ONLY)
Abs Immature Granulocytes: 0.01 10*3/uL (ref 0.00–0.07)
Basophils Absolute: 0 10*3/uL (ref 0.0–0.1)
Basophils Relative: 1 %
Eosinophils Absolute: 0.1 10*3/uL (ref 0.0–0.5)
Eosinophils Relative: 2 %
HCT: 42.3 % (ref 39.0–52.0)
Hemoglobin: 13.8 g/dL (ref 13.0–17.0)
Immature Granulocytes: 0 %
Lymphocytes Relative: 25 %
Lymphs Abs: 1.3 10*3/uL (ref 0.7–4.0)
MCH: 31.4 pg (ref 26.0–34.0)
MCHC: 32.6 g/dL (ref 30.0–36.0)
MCV: 96.1 fL (ref 80.0–100.0)
Monocytes Absolute: 0.6 10*3/uL (ref 0.1–1.0)
Monocytes Relative: 11 %
Neutro Abs: 3.3 10*3/uL (ref 1.7–7.7)
Neutrophils Relative %: 61 %
Platelet Count: 174 10*3/uL (ref 150–400)
RBC: 4.4 MIL/uL (ref 4.22–5.81)
RDW: 12.9 % (ref 11.5–15.5)
WBC Count: 5.4 10*3/uL (ref 4.0–10.5)
nRBC: 0 % (ref 0.0–0.2)

## 2022-03-07 LAB — TSH: TSH: 1.698 u[IU]/mL (ref 0.350–4.500)

## 2022-03-07 MED ORDER — SODIUM CHLORIDE 0.9 % IV SOLN: Freq: Once | INTRAVENOUS | Status: AC

## 2022-03-07 MED ORDER — SODIUM CHLORIDE 0.9 % IV SOLN
480.0000 mg | Freq: Once | INTRAVENOUS | Status: AC
  Administered 2022-03-07: 480 mg via INTRAVENOUS
  Filled 2022-03-07: qty 48

## 2022-03-07 NOTE — Patient Instructions (Signed)
[facility] [place]  Discharge Instructions: Thank you for choosing [facility] to provide your oncology and hematology care.   If you have a lab appointment with the [facility], please go directly to the [facility] and check in at the registration area.  Wear comfortable clothing and clothing appropriate for easy access to any Portacath or PICC line.   We strive to give you quality time with your provider. You may need to reschedule your appointment if you arrive late (15 or more minutes).  Arriving late affects you and other patients whose appointments are after yours.  Also, if you miss three or more appointments without notifying the office, you may be dismissed from the clinic at the provider's discretion.      For prescription refill requests, have your pharmacy contact our office and allow 72 hours for refills to be completed.    Today you received the following chemotherapy and/or immunotherapy agents OPdivo      To help prevent nausea and vomiting after your treatment, we encourage you to take your nausea medication as directed.  BELOW ARE SYMPTOMS THAT SHOULD BE REPORTED IMMEDIATELY: *FEVER GREATER THAN 100.4 F (38 C) OR HIGHER *CHILLS OR SWEATING *NAUSEA AND VOMITING THAT IS NOT CONTROLLED WITH YOUR NAUSEA MEDICATION *UNUSUAL SHORTNESS OF BREATH *UNUSUAL BRUISING OR BLEEDING *URINARY PROBLEMS (pain or burning when urinating, or frequent urination) *BOWEL PROBLEMS (unusual diarrhea, constipation, pain near the anus) TENDERNESS IN MOUTH AND THROAT WITH OR WITHOUT PRESENCE OF ULCERS (sore throat, sores in mouth, or a toothache) UNUSUAL RASH, SWELLING OR PAIN  UNUSUAL VAGINAL DISCHARGE OR ITCHING   Items with * indicate a potential emergency and should be followed up as soon as possible or go to the Emergency Department if any problems should occur.  Please show the CHEMOTHERAPY ALERT CARD or IMMUNOTHERAPY ALERT CARD at check-in  to the Emergency Department and triage nurse. Should you have questions after your visit or need to cancel or reschedule your appointment, please contact [facility]  [phone] and follow the prompts.  Office hours are 8:00 a.m. to 4:30 p.m. Monday - Friday. Please note that voicemails left after 4:00 p.m. may not be returned until the following business day.  We are closed weekends and major holidays. You have access to a nurse at all times for urgent questions. Please call the main number to the clinic [phone] and follow the prompts.  For any non-urgent questions, you may also contact your provider using MyChart. We now offer e-Visits for anyone 64 and older to request care online for non-urgent symptoms. For details visit [email or web address].   Also download the MyChart app! Go to the app store, search "MyChart", open the app, select [facility], and log in with your MyChart username and password.

## 2022-03-07 NOTE — Progress Notes (Signed)
Hematology and Oncology Follow Up Visit  Francis Cunningham [id number] Dec 11, 1950 72 y.o. 03/07/2022   Principle Diagnosis:  Metastatic renal cell carcinoma-bone only metastasis  Current Therapy:   Maintenance nivolumab-Q 4-week dosing -- s/p cycle #22     Interim History:  Francis Cunningham is back for follow-up.  He comes in with his wife.  He is doing quite well.  As always, they really enjoy where they live.  They have a lot of activities there.  He has had no problems with cough or shortness of breath.  He has had no nausea or vomiting.  He has had no bleeding.  There is been no change in bowel or bladder habits.  He is going to have cataract surgery in April.  The first surgery was April 15.  Second 1 is May 6.  I do not see a problem with him having cataract surgery and taking immunotherapy.  He has had no headache.  There is been no mouth sores.  He had a little bit of leg swelling which is chronic.  Overall, I would say that his performance status is ECOG 0.  .   Medications:  Current Outpatient Medications:    alfuzosin (UROXATRAL) 10 MG 24 hr tablet, Take 10 mg by mouth at bedtime., Disp: , Rfl:    ascorbic acid (VITAMIN C) 500 MG tablet, Take 500 mg by mouth in the morning., Disp: , Rfl:    Cholecalciferol 25 MCG (1000 UT) tablet, Take 2,000 Units by mouth daily., Disp: , Rfl:    COVID-19 mRNA vaccine 2023-2024 (COMIRNATY) syringe, Inject into the muscle., Disp: 0.3 mL, Rfl: 0   escitalopram (LEXAPRO) 20 MG tablet, Take 20 mg by mouth daily., Disp: , Rfl:    finasteride (PROSCAR) 5 MG tablet, Take 5 mg by mouth daily., Disp: , Rfl:    guaiFENesin (MUCINEX) 600 MG 12 hr tablet, Take 600 mg by mouth in the morning and at bedtime., Disp: , Rfl:    Hypertonic Nasal Wash (SINUS RINSE) PACK, Irrigate with as directed., Disp: , Rfl:    LORazepam (ATIVAN) 1 MG tablet, Take 1 mg by mouth as needed. Ona tablest as needed for procedures and imaging, Disp: , Rfl:    Magnesium Carbonate  POWD, Take 325 mg by mouth daily. 1 1/2  teaspoons daily., Disp: , Rfl:    melatonin 3 MG TABS tablet, Take 3 mg by mouth at bedtime., Disp: , Rfl:    midodrine (PROAMATINE) 2.5 MG tablet, TAKE ONE (1) TABLET BY MOUTH 3 TIMES DAILY, Disp: 90 tablet, Rfl: 8   montelukast (SINGULAIR) 10 MG tablet, Take 1 tablet by mouth daily., Disp: , Rfl:    Multiple Vitamin (MULTI-VITAMINS) TABS, Take 1 tablet by mouth daily., Disp: , Rfl:    nivolumab (OPDIVO) 100 MG/10ML SOLN chemo injection, Inject into the vein., Disp: , Rfl:    Omega-3 Fatty Acids (FISH OIL PO), Take 5 mLs by mouth daily., Disp: , Rfl:    polyethylene glycol powder (GLYCOLAX/MIRALAX) 17 GM/SCOOP powder, Take 17 g by mouth at bedtime., Disp: , Rfl:    Potassium Citrate-Citric Acid 3300-1002 MG PACK, Take by mouth daily., Disp: , Rfl:    predniSONE (DELTASONE) 10 MG tablet, Take 1 tablet (10 mg total) by mouth daily., Disp: 90 tablet, Rfl: 6   Probiotic Product (PROBIOTIC ACIDOPHILUS BEADS PO), Take by mouth daily., Disp: , Rfl:    propranolol (INDERAL) 10 MG tablet, Take 10 mg by mouth daily as needed. For anxiety, Disp: ,  Rfl:    vitamin E 180 MG (400 UNITS) capsule, Take 400 Units by mouth every morning., Disp: , Rfl:   Allergies:  Allergies  Allergen Reactions   Morphine Nausea Only    Past Medical History, Surgical history, Social history, and Family History were reviewed and updated.  Review of Systems: Review of Systems  Constitutional: Negative.   HENT:   Positive for mouth sores.   Eyes: Negative.   Respiratory: Negative.    Cardiovascular: Negative.   Gastrointestinal: Negative.   Endocrine: Negative.   Genitourinary: Negative.    Musculoskeletal:  Positive for arthralgias and back pain.  Skin: Negative.   Neurological: Negative.   Hematological: Negative.   Psychiatric/Behavioral: Negative.      Physical Exam:  height is '6\' 3"'$  (1.905 m) and weight is 237 lb (107.5 kg). His oral temperature is 98 F (36.7 C).  His blood pressure is 101/58 (abnormal) and his pulse is 70. His respiration is 17 and oxygen saturation is 99%.   Wt Readings from Last 3 Encounters:  03/07/22 237 lb (107.5 kg)  02/07/22 238 lb (108 kg)  01/10/22 240 lb (108.9 kg)    Physical Exam Vitals reviewed.  HENT:     Head: Normocephalic and atraumatic.  Eyes:     Pupils: Pupils are equal, round, and reactive to light.  Cardiovascular:     Rate and Rhythm: Normal rate and regular rhythm.     Heart sounds: Normal heart sounds.  Pulmonary:     Effort: Pulmonary effort is normal.     Breath sounds: Normal breath sounds.  Abdominal:     General: Bowel sounds are normal.     Palpations: Abdomen is soft.  Musculoskeletal:        General: No tenderness or deformity. Normal range of motion.     Cervical back: Normal range of motion.  Lymphadenopathy:     Cervical: No cervical adenopathy.  Skin:    General: Skin is warm and dry.     Findings: No erythema or rash.  Neurological:     Mental Status: He is alert and oriented to person, place, and time.  Psychiatric:        Behavior: Behavior normal.        Thought Content: Thought content normal.        Judgment: Judgment normal.      Lab Results  Component Value Date   WBC 5.4 03/07/2022   HGB 13.8 03/07/2022   HCT 42.3 03/07/2022   MCV 96.1 03/07/2022   PLT 174 03/07/2022     Chemistry      Component Value Date/Time   NA 142 03/07/2022 0831   K 3.6 03/07/2022 0831   CL 109 03/07/2022 0831   CO2 25 03/07/2022 0831   BUN 26 (H) 03/07/2022 0831   CREATININE 0.90 03/07/2022 0831      Component Value Date/Time   CALCIUM 10.7 (H) 03/07/2022 0831   ALKPHOS 120 03/07/2022 0831   AST 11 (L) 03/07/2022 0831   ALT 17 03/07/2022 0831   BILITOT 0.5 03/07/2022 0831      Impression and Plan: Francis Cunningham is a very nice 72 year old white male.  He has metastatic renal cell carcinoma.  Thankfully, he is continuing to respond to immunotherapy.   So far, everything  really looks great.  I do not see a problem with respect to the renal cell carcinoma coming back.  We will go ahead and get him set up with another PET scan.  Will do this in late February or early March.  If this PET scan looks fine, then we will move his appointments out to every 6 weeks.  Again, I do not see a problem with him having the cataract surgery.   Volanda Napoleon, MD 2/7/20249:21 AM

## 2022-03-08 ENCOUNTER — Other Ambulatory Visit: Payer: Self-pay

## 2022-03-09 ENCOUNTER — Other Ambulatory Visit: Payer: Self-pay

## 2022-03-29 ENCOUNTER — Other Ambulatory Visit: Payer: Self-pay

## 2022-04-02 ENCOUNTER — Encounter (HOSPITAL_COMMUNITY)
Admission: RE | Admit: 2022-04-02 | Discharge: 2022-04-02 | Disposition: A | Discharge status: Home / Self Care | Payer: Medicare Other | Source: Ambulatory Visit | Attending: Hematology & Oncology | Admitting: Hematology & Oncology

## 2022-04-02 ENCOUNTER — Encounter: Payer: Self-pay | Admitting: *Deleted

## 2022-04-02 DIAGNOSIS — C641 Malignant neoplasm of right kidney, except renal pelvis: Secondary | ICD-10-CM

## 2022-04-02 LAB — GLUCOSE, CAPILLARY: Glucose-Capillary: 94 mg/dL (ref 70–99)

## 2022-04-02 MED ORDER — FLUDEOXYGLUCOSE F - 18 (FDG) INJECTION
11.8000 | Freq: Once | INTRAVENOUS | Status: AC
  Administered 2022-04-02: 11.8 via INTRAVENOUS

## 2022-04-03 ENCOUNTER — Inpatient Hospital Stay: Payer: Medicare Other

## 2022-04-03 ENCOUNTER — Inpatient Hospital Stay: Payer: Medicare Other | Attending: Hematology & Oncology

## 2022-04-03 ENCOUNTER — Encounter: Payer: Self-pay | Admitting: Hematology & Oncology

## 2022-04-03 ENCOUNTER — Inpatient Hospital Stay (HOSPITAL_BASED_OUTPATIENT_CLINIC_OR_DEPARTMENT_OTHER): Payer: Medicare Other | Admitting: Hematology & Oncology

## 2022-04-03 VITALS — BP 93/51 | HR 50 | Resp 17

## 2022-04-03 VITALS — BP 106/62 | HR 81 | Temp 98.1°F | Resp 20 | Ht 75.0 in | Wt 235.0 lb

## 2022-04-03 DIAGNOSIS — C641 Malignant neoplasm of right kidney, except renal pelvis: Secondary | ICD-10-CM

## 2022-04-03 DIAGNOSIS — Z5112 Encounter for antineoplastic immunotherapy: Secondary | ICD-10-CM | POA: Insufficient documentation

## 2022-04-03 DIAGNOSIS — C7951 Secondary malignant neoplasm of bone: Secondary | ICD-10-CM | POA: Insufficient documentation

## 2022-04-03 LAB — CBC WITH DIFFERENTIAL (CANCER CENTER ONLY)
Abs Immature Granulocytes: 0.01 10*3/uL (ref 0.00–0.07)
Basophils Absolute: 0 10*3/uL (ref 0.0–0.1)
Basophils Relative: 1 %
Eosinophils Absolute: 0.1 10*3/uL (ref 0.0–0.5)
Eosinophils Relative: 2 %
HCT: 41.7 % (ref 39.0–52.0)
Hemoglobin: 13.9 g/dL (ref 13.0–17.0)
Immature Granulocytes: 0 %
Lymphocytes Relative: 26 %
Lymphs Abs: 1.3 10*3/uL (ref 0.7–4.0)
MCH: 32 pg (ref 26.0–34.0)
MCHC: 33.3 g/dL (ref 30.0–36.0)
MCV: 96.1 fL (ref 80.0–100.0)
Monocytes Absolute: 0.5 10*3/uL (ref 0.1–1.0)
Monocytes Relative: 10 %
Neutro Abs: 2.9 10*3/uL (ref 1.7–7.7)
Neutrophils Relative %: 61 %
Platelet Count: 174 10*3/uL (ref 150–400)
RBC: 4.34 MIL/uL (ref 4.22–5.81)
RDW: 12.8 % (ref 11.5–15.5)
WBC Count: 4.8 10*3/uL (ref 4.0–10.5)
nRBC: 0 % (ref 0.0–0.2)

## 2022-04-03 LAB — CMP (CANCER CENTER ONLY)
ALT: 16 U/L (ref 0–44)
AST: 10 U/L — ABNORMAL LOW (ref 15–41)
Albumin: 3.8 g/dL (ref 3.5–5.0)
Alkaline Phosphatase: 116 U/L (ref 38–126)
Anion gap: 7 (ref 5–15)
BUN: 26 mg/dL — ABNORMAL HIGH (ref 8–23)
CO2: 25 mmol/L (ref 22–32)
Calcium: 10.5 mg/dL — ABNORMAL HIGH (ref 8.9–10.3)
Chloride: 109 mmol/L (ref 98–111)
Creatinine: 0.95 mg/dL (ref 0.61–1.24)
GFR, Estimated: >60 mL/min (ref >60)
Glucose, Bld: 117 mg/dL — ABNORMAL HIGH (ref 70–99)
Potassium: 3.7 mmol/L (ref 3.5–5.1)
Sodium: 141 mmol/L (ref 135–145)
Total Bilirubin: 0.6 mg/dL (ref 0.3–1.2)
Total Protein: 6 g/dL — ABNORMAL LOW (ref 6.5–8.1)

## 2022-04-03 LAB — LACTATE DEHYDROGENASE: LDH: 131 U/L (ref 98–192)

## 2022-04-03 MED ORDER — SODIUM CHLORIDE 0.9 % IV SOLN: Freq: Once | INTRAVENOUS | Status: AC

## 2022-04-03 MED ORDER — SODIUM CHLORIDE 0.9 % IV SOLN
480.0000 mg | Freq: Once | INTRAVENOUS | Status: AC
  Administered 2022-04-03: 480 mg via INTRAVENOUS
  Filled 2022-04-03: qty 48

## 2022-04-03 NOTE — Progress Notes (Signed)
Hematology and Oncology Follow Up Visit  Kaylor Bort III [id number] 1950/11/15 72 y.o. 04/03/2022   Principle Diagnosis:  Metastatic renal cell carcinoma-bone only metastasis  Current Therapy:   Maintenance nivolumab-Q 6-week dosing -- s/p cycle #23 --changed to 6-week dosing on 04/03/2022     Interim History:  Mr. Nakano is back for follow-up.  He is doing quite well.  He and his wife had a wonderful weekend.  There was so active.  We did do a PET scan on him.  This was done last week.  Thankfully, the PET scan did not show any evidence of active renal cell carcinoma.  I think we can now move his treatments out every 6 weeks.  I feel very confident that he will still do very well with 6-week intervals.  Both he and his wife are going to have cataract surgery.  Sounds like he may have his done first.  They have had no problems with nausea or vomiting.  He has had no change in bowel or bladder habits.  There has been no cough or shortness of breath.  He has had no fever.  He has had no problems with COVID.  We have watching his thyroid.  His last TSH in February was 1.7.  Overall, I would say that his performance status is probably ECOG 0.     Medications:  Current Outpatient Medications:    acetaminophen (TYLENOL) 325 MG tablet, Take 325 mg by mouth every 6 (six) hours as needed., Disp: , Rfl:    alfuzosin (UROXATRAL) 10 MG 24 hr tablet, Take 10 mg by mouth at bedtime., Disp: , Rfl:    ascorbic acid (VITAMIN C) 500 MG tablet, Take 500 mg by mouth in the morning., Disp: , Rfl:    Cholecalciferol 25 MCG (1000 UT) tablet, Take 2,000 Units by mouth daily., Disp: , Rfl:    escitalopram (LEXAPRO) 20 MG tablet, Take 20 mg by mouth daily., Disp: , Rfl:    finasteride (PROSCAR) 5 MG tablet, Take 5 mg by mouth daily., Disp: , Rfl:    guaiFENesin (MUCINEX) 600 MG 12 hr tablet, Take 600 mg by mouth in the morning and at bedtime., Disp: , Rfl:    Hypertonic Nasal Wash (SINUS RINSE) PACK,  Irrigate with as directed., Disp: , Rfl:    LORazepam (ATIVAN) 1 MG tablet, Take 2 mg by mouth as needed. Ona tablest as needed for procedures and imaging, Disp: , Rfl:    Magnesium Carbonate POWD, Take 325 mg by mouth daily. 1 1/2  teaspoons daily., Disp: , Rfl:    melatonin 3 MG TABS tablet, Take 3 mg by mouth at bedtime., Disp: , Rfl:    midodrine (PROAMATINE) 2.5 MG tablet, TAKE ONE (1) TABLET BY MOUTH 3 TIMES DAILY, Disp: 90 tablet, Rfl: 8   montelukast (SINGULAIR) 10 MG tablet, Take 1 tablet by mouth daily., Disp: , Rfl:    Multiple Vitamin (MULTI-VITAMINS) TABS, Take 1 tablet by mouth daily., Disp: , Rfl:    nivolumab (OPDIVO) 100 MG/10ML SOLN chemo injection, Inject into the vein., Disp: , Rfl:    Omega-3 Fatty Acids (FISH OIL PO), Take 5 mLs by mouth daily., Disp: , Rfl:    polyethylene glycol powder (GLYCOLAX/MIRALAX) 17 GM/SCOOP powder, Take 17 g by mouth at bedtime., Disp: , Rfl:    Potassium Citrate-Citric Acid 3300-1002 MG PACK, Take by mouth daily., Disp: , Rfl:    predniSONE (DELTASONE) 10 MG tablet, Take 1 tablet (10 mg total) by mouth  daily., Disp: 90 tablet, Rfl: 6   Probiotic Product (PROBIOTIC ACIDOPHILUS BEADS PO), Take by mouth daily., Disp: , Rfl:    vitamin E 180 MG (400 UNITS) capsule, Take 400 Units by mouth every morning., Disp: , Rfl:    propranolol (INDERAL) 10 MG tablet, Take 10 mg by mouth daily as needed. For anxiety (Patient not taking: Reported on 04/03/2022), Disp: , Rfl:   Allergies:  Allergies  Allergen Reactions   Morphine Nausea Only    Past Medical History, Surgical history, Social history, and Family History were reviewed and updated.  Review of Systems: Review of Systems  Constitutional: Negative.   HENT:   Positive for mouth sores.   Eyes: Negative.   Respiratory: Negative.    Cardiovascular: Negative.   Gastrointestinal: Negative.   Endocrine: Negative.   Genitourinary: Negative.    Musculoskeletal:  Positive for arthralgias and back pain.   Skin: Negative.   Neurological: Negative.   Hematological: Negative.   Psychiatric/Behavioral: Negative.      Physical Exam:  height is '6\' 3"'$  (1.905 m) and weight is 235 lb 0.6 oz (106.6 kg). His oral temperature is 98.1 F (36.7 C). His blood pressure is 106/62 and his pulse is 81. His respiration is 20 and oxygen saturation is 96%.   Wt Readings from Last 3 Encounters:  04/03/22 235 lb 0.6 oz (106.6 kg)  03/07/22 237 lb (107.5 kg)  02/07/22 238 lb (108 kg)    Physical Exam Vitals reviewed.  HENT:     Head: Normocephalic and atraumatic.  Eyes:     Pupils: Pupils are equal, round, and reactive to light.  Cardiovascular:     Rate and Rhythm: Normal rate and regular rhythm.     Heart sounds: Normal heart sounds.  Pulmonary:     Effort: Pulmonary effort is normal.     Breath sounds: Normal breath sounds.  Abdominal:     General: Bowel sounds are normal.     Palpations: Abdomen is soft.  Musculoskeletal:        General: No tenderness or deformity. Normal range of motion.     Cervical back: Normal range of motion.  Lymphadenopathy:     Cervical: No cervical adenopathy.  Skin:    General: Skin is warm and dry.     Findings: No erythema or rash.  Neurological:     Mental Status: He is alert and oriented to person, place, and time.  Psychiatric:        Behavior: Behavior normal.        Thought Content: Thought content normal.        Judgment: Judgment normal.     Lab Results  Component Value Date   WBC 4.8 04/03/2022   HGB 13.9 04/03/2022   HCT 41.7 04/03/2022   MCV 96.1 04/03/2022   PLT 174 04/03/2022     Chemistry      Component Value Date/Time   NA 142 03/07/2022 0831   K 3.6 03/07/2022 0831   CL 109 03/07/2022 0831   CO2 25 03/07/2022 0831   BUN 26 (H) 03/07/2022 0831   CREATININE 0.90 03/07/2022 0831      Component Value Date/Time   CALCIUM 10.7 (H) 03/07/2022 0831   ALKPHOS 120 03/07/2022 0831   AST 11 (L) 03/07/2022 0831   ALT 17 03/07/2022 0831    BILITOT 0.5 03/07/2022 0831      Impression and Plan: Mr. Deruiter is a very nice 72 year old white male.  He has metastatic renal cell  carcinoma.  Thankfully, he is continuing to respond to immunotherapy.   Again, we will move his treatments out every 6 weeks.  I am just happy for him.  Will still follow-up with our PET scan.  He will need 1 in June.  I will plan to see him back in 6 weeks.  I do not see about him having cataract surgery.  I do not see any precautions that need to be taken.    Volanda Napoleon, MD 3/5/20249:24 AM

## 2022-04-03 NOTE — Patient Instructions (Signed)
Nivolumab Injection What is this medication? NIVOLUMAB (nye VOL ue mab) treats some types of cancer. It works by helping your immune system slow or stop the spread of cancer cells. It is a monoclonal antibody. This medicine may be used for other purposes; ask your health care provider or pharmacist if you have questions. COMMON BRAND NAME(S): Opdivo What should I tell my care team before I take this medication? They need to know if you have any of these conditions: Allogeneic stem cell transplant (uses someone else's stem cells) Autoimmune diseases, such as Crohn disease, ulcerative colitis, lupus History of chest radiation Nervous system problems, such as Guillain-Barre syndrome or myasthenia gravis Organ transplant An unusual or allergic reaction to nivolumab, other medications, foods, dyes, or preservatives Pregnant or trying to get pregnant Breast-feeding How should I use this medication? This medication is infused into a vein. It is given in a hospital or clinic setting. A special MedGuide will be given to you before each treatment. Be sure to read this information carefully each time. Talk to your care team about the use of this medication in children. While it may be prescribed for children as young as 12 years for selected conditions, precautions do apply. Overdosage: If you think you have taken too much of this medicine contact a poison control center or emergency room at once. NOTE: This medicine is only for you. Do not share this medicine with others. What if I miss a dose? Keep appointments for follow-up doses. It is important not to miss your dose. Call your care team if you are unable to keep an appointment. What may interact with this medication? Interactions have not been studied. This list may not describe all possible interactions. Give your health care provider a list of all the medicines, herbs, non-prescription drugs, or dietary supplements you use. Also tell them if you  smoke, drink alcohol, or use illegal drugs. Some items may interact with your medicine. What should I watch for while using this medication? Your condition will be monitored carefully while you are receiving this medication. You may need blood work while taking this medication. This medication may cause serious skin reactions. They can happen weeks to months after starting the medication. Contact your care team right away if you notice fevers or flu-like symptoms with a rash. The rash may be red or purple and then turn into blisters or peeling of the skin. You may also notice a red rash with swelling of the face, lips, or lymph nodes in your neck or under your arms. Tell your care team right away if you have any change in your eyesight. Talk to your care team if you are pregnant or think you might be pregnant. A negative pregnancy test is required before starting this medication. A reliable form of contraception is recommended while taking this medication and for 5 months after the last dose. Talk to your care team about effective forms of contraception. Do not breast-feed while taking this medication and for 5 months after the last dose. What side effects may I notice from receiving this medication? Side effects that you should report to your care team as soon as possible: Allergic reactions--skin rash, itching, hives, swelling of the face, lips, tongue, or throat Dry cough, shortness of breath or trouble breathing Eye pain, redness, irritation, or discharge with blurry or decreased vision Heart muscle inflammation--unusual weakness or fatigue, shortness of breath, chest pain, fast or irregular heartbeat, dizziness, swelling of the ankles, feet, or hands Hormone  gland problems--headache, sensitivity to light, unusual weakness or fatigue, dizziness, fast or irregular heartbeat, increased sensitivity to cold or heat, excessive sweating, constipation, hair loss, increased thirst or amount of urine,  tremors or shaking, irritability Infusion reactions--chest pain, shortness of breath or trouble breathing, feeling faint or lightheaded Kidney injury (glomerulonephritis)--decrease in the amount of urine, red or dark brown urine, foamy or bubbly urine, swelling of the ankles, hands, or feet Liver injury--right upper belly pain, loss of appetite, nausea, light-colored stool, dark yellow or brown urine, yellowing skin or eyes, unusual weakness or fatigue Pain, tingling, or numbness in the hands or feet, muscle weakness, change in vision, confusion or trouble speaking, loss of balance or coordination, trouble walking, seizures Rash, fever, and swollen lymph nodes Redness, blistering, peeling, or loosening of the skin, including inside the mouth Sudden or severe stomach pain, bloody diarrhea, fever, nausea, vomiting Side effects that usually do not require medical attention (report these to your care team if they continue or are bothersome): Bone, joint, or muscle pain Diarrhea Fatigue Loss of appetite Nausea Skin rash This list may not describe all possible side effects. Call your doctor for medical advice about side effects. You may report side effects to FDA at 1-800-FDA-1088. Where should I keep my medication? This medication is given in a hospital or clinic. It will not be stored at home. NOTE: This sheet is a summary. It may not cover all possible information. If you have questions about this medicine, talk to your doctor, pharmacist, or health care provider.  2023 Elsevier/Gold Standard (2013-12-07 00:00:00)

## 2022-04-04 ENCOUNTER — Ambulatory Visit: Payer: Medicare Other | Admitting: Hematology & Oncology

## 2022-04-04 ENCOUNTER — Inpatient Hospital Stay: Payer: Medicare Other

## 2022-04-04 ENCOUNTER — Ambulatory Visit: Payer: Medicare Other

## 2022-04-04 ENCOUNTER — Other Ambulatory Visit: Payer: Self-pay

## 2022-04-20 ENCOUNTER — Encounter: Payer: Self-pay | Admitting: Hematology and Oncology

## 2022-04-20 ENCOUNTER — Other Ambulatory Visit: Payer: Self-pay | Admitting: Hematology and Oncology

## 2022-04-20 NOTE — Progress Notes (Signed)
START ON PATHWAY REGIMEN - Renal Cell     A cycle is every 28 days:     Nivolumab   **Always confirm dose/schedule in your pharmacy ordering system**  Patient Characteristics: Stage IV (Unresected T4M0 or Any T, M1)/Metastatic Disease, Clear Cell, Second Line, No Prior Checkpoint Inhibitor Therapeutic Status: Stage IV (Unresected T4M0 or Any T, M1)/Metastatic Disease Histology: Clear Cell Line of Therapy: Second Line Intent of Therapy: Curative Intent, Not Discussed with Patient

## 2022-04-27 ENCOUNTER — Other Ambulatory Visit: Payer: Self-pay | Admitting: Hematology & Oncology

## 2022-04-27 DIAGNOSIS — C641 Malignant neoplasm of right kidney, except renal pelvis: Secondary | ICD-10-CM

## 2022-05-01 ENCOUNTER — Other Ambulatory Visit: Payer: Self-pay

## 2022-05-02 ENCOUNTER — Inpatient Hospital Stay: Payer: Medicare Other

## 2022-05-02 ENCOUNTER — Ambulatory Visit: Payer: Medicare Other | Admitting: Hematology & Oncology

## 2022-05-02 ENCOUNTER — Inpatient Hospital Stay: Payer: Medicare Other | Attending: Hematology & Oncology

## 2022-05-02 ENCOUNTER — Inpatient Hospital Stay (HOSPITAL_BASED_OUTPATIENT_CLINIC_OR_DEPARTMENT_OTHER): Payer: Medicare Other | Admitting: Hematology & Oncology

## 2022-05-02 ENCOUNTER — Ambulatory Visit: Payer: Medicare Other

## 2022-05-02 ENCOUNTER — Encounter: Payer: Self-pay | Admitting: Hematology & Oncology

## 2022-05-02 VITALS — BP 112/64 | HR 67 | Resp 16

## 2022-05-02 DIAGNOSIS — C641 Malignant neoplasm of right kidney, except renal pelvis: Secondary | ICD-10-CM | POA: Insufficient documentation

## 2022-05-02 DIAGNOSIS — Z79620 Long term (current) use of immunosuppressive biologic: Secondary | ICD-10-CM | POA: Diagnosis not present

## 2022-05-02 DIAGNOSIS — C7951 Secondary malignant neoplasm of bone: Secondary | ICD-10-CM | POA: Diagnosis present

## 2022-05-02 DIAGNOSIS — Z5112 Encounter for antineoplastic immunotherapy: Secondary | ICD-10-CM | POA: Diagnosis present

## 2022-05-02 DIAGNOSIS — E034 Atrophy of thyroid (acquired): Secondary | ICD-10-CM | POA: Diagnosis not present

## 2022-05-02 DIAGNOSIS — M7989 Other specified soft tissue disorders: Secondary | ICD-10-CM | POA: Diagnosis not present

## 2022-05-02 LAB — CMP (CANCER CENTER ONLY)
ALT: 17 U/L (ref 0–44)
AST: 12 U/L — ABNORMAL LOW (ref 15–41)
Albumin: 3.9 g/dL (ref 3.5–5.0)
Alkaline Phosphatase: 105 U/L (ref 38–126)
Anion gap: 7 (ref 5–15)
BUN: 32 mg/dL — ABNORMAL HIGH (ref 8–23)
CO2: 26 mmol/L (ref 22–32)
Calcium: 10.2 mg/dL (ref 8.9–10.3)
Chloride: 108 mmol/L (ref 98–111)
Creatinine: 0.86 mg/dL (ref 0.61–1.24)
GFR, Estimated: >60 mL/min (ref >60)
Glucose, Bld: 118 mg/dL — ABNORMAL HIGH (ref 70–99)
Potassium: 4.1 mmol/L (ref 3.5–5.1)
Sodium: 141 mmol/L (ref 135–145)
Total Bilirubin: 0.5 mg/dL (ref 0.3–1.2)
Total Protein: 6 g/dL — ABNORMAL LOW (ref 6.5–8.1)

## 2022-05-02 LAB — CBC WITH DIFFERENTIAL (CANCER CENTER ONLY)
Abs Immature Granulocytes: 0.02 10*3/uL (ref 0.00–0.07)
Basophils Absolute: 0 10*3/uL (ref 0.0–0.1)
Basophils Relative: 1 %
Eosinophils Absolute: 0.1 10*3/uL (ref 0.0–0.5)
Eosinophils Relative: 1 %
HCT: 39.3 % (ref 39.0–52.0)
Hemoglobin: 13.2 g/dL (ref 13.0–17.0)
Immature Granulocytes: 0 %
Lymphocytes Relative: 18 %
Lymphs Abs: 1 10*3/uL (ref 0.7–4.0)
MCH: 32.2 pg (ref 26.0–34.0)
MCHC: 33.6 g/dL (ref 30.0–36.0)
MCV: 95.9 fL (ref 80.0–100.0)
Monocytes Absolute: 0.6 10*3/uL (ref 0.1–1.0)
Monocytes Relative: 11 %
Neutro Abs: 3.9 10*3/uL (ref 1.7–7.7)
Neutrophils Relative %: 69 %
Platelet Count: 165 10*3/uL (ref 150–400)
RBC: 4.1 MIL/uL — ABNORMAL LOW (ref 4.22–5.81)
RDW: 12.9 % (ref 11.5–15.5)
WBC Count: 5.6 10*3/uL (ref 4.0–10.5)
nRBC: 0 % (ref 0.0–0.2)

## 2022-05-02 LAB — TSH: TSH: 1.545 u[IU]/mL (ref 0.350–4.500)

## 2022-05-02 MED ORDER — SODIUM CHLORIDE 0.9 % IV SOLN
480.0000 mg | Freq: Once | INTRAVENOUS | Status: AC
  Administered 2022-05-02: 480 mg via INTRAVENOUS
  Filled 2022-05-02: qty 48

## 2022-05-02 MED ORDER — SODIUM CHLORIDE 0.9 % IV SOLN: Freq: Once | INTRAVENOUS | Status: AC

## 2022-05-02 NOTE — Progress Notes (Signed)
Hematology and Oncology Follow Up Visit  Francis Cunningham [id number] 18-Oct-1950 72 y.o. 05/02/2022   Principle Diagnosis:  Metastatic renal cell carcinoma-bone only metastasis  Current Therapy:   Maintenance nivolumab-Q 6-week dosing -- s/p cycle #23 --changed to 6-week dosing on 04/03/2022     Interim History:  Francis Cunningham is back for follow-up.  As always, he is doing quite nicely.  He was seen by his orthopedic surgeon right for the follow-up for his right hip surgery.  Everything seemed to be going quite well.  He only has to go once a year to see him.  He is still set up for his cataract surgery on April 15.  The second surgery will be May 6.  He and his wife had a wonderful Easter weekend.  He is walking.  He does sit quite a bit during the daytime.  Has little bit of swelling in his lower extremities-more so on the right than on the left.  He does not have any problems with hypothyroidism.  His last TSH was 1.7 back in February.  He has had no fever.  There has been no cough or shortness of breath.  He has had no change in bowel or bladder habits.  He has had no nausea or vomiting.  There has been no rashes.  He has had no bleeding.  Overall, I would say his performance status is probably ECOG 1.       Medications:  Current Outpatient Medications:    acetaminophen (TYLENOL) 325 MG tablet, Take 325 mg by mouth every 6 (six) hours as needed., Disp: , Rfl:    alfuzosin (UROXATRAL) 10 MG 24 hr tablet, Take 10 mg by mouth at bedtime., Disp: , Rfl:    ascorbic acid (VITAMIN C) 500 MG tablet, Take 500 mg by mouth in the morning., Disp: , Rfl:    Cholecalciferol 25 MCG (1000 UT) tablet, Take 2,000 Units by mouth daily., Disp: , Rfl:    escitalopram (LEXAPRO) 20 MG tablet, Take 20 mg by mouth daily., Disp: , Rfl:    finasteride (PROSCAR) 5 MG tablet, Take 5 mg by mouth daily., Disp: , Rfl:    guaiFENesin (MUCINEX) 600 MG 12 hr tablet, Take 600 mg by mouth in the morning and at  bedtime., Disp: , Rfl:    Hypertonic Nasal Wash (SINUS RINSE) PACK, Irrigate with as directed., Disp: , Rfl:    Magnesium Carbonate POWD, Take 325 mg by mouth daily. 1 1/2  teaspoons daily., Disp: , Rfl:    melatonin 3 MG TABS tablet, Take 3 mg by mouth at bedtime., Disp: , Rfl:    midodrine (PROAMATINE) 2.5 MG tablet, TAKE ONE (1) TABLET BY MOUTH 3 TIMES DAILY, Disp: 90 tablet, Rfl: 8   montelukast (SINGULAIR) 10 MG tablet, Take 1 tablet by mouth daily., Disp: , Rfl:    Multiple Vitamin (MULTI-VITAMINS) TABS, Take 1 tablet by mouth daily., Disp: , Rfl:    nivolumab (OPDIVO) 100 MG/10ML SOLN chemo injection, Inject into the vein., Disp: , Rfl:    Omega-3 Fatty Acids (FISH OIL PO), Take 5 mLs by mouth daily., Disp: , Rfl:    polyethylene glycol powder (GLYCOLAX/MIRALAX) 17 GM/SCOOP powder, Take 17 g by mouth at bedtime., Disp: , Rfl:    Potassium Citrate-Citric Acid 3300-1002 MG PACK, Take by mouth daily., Disp: , Rfl:    predniSONE (DELTASONE) 10 MG tablet, Take 1 tablet (10 mg total) by mouth daily., Disp: 90 tablet, Rfl: 6   Probiotic Product (  PROBIOTIC ACIDOPHILUS BEADS PO), Take by mouth daily., Disp: , Rfl:    vitamin E 180 MG (400 UNITS) capsule, Take 400 Units by mouth every morning., Disp: , Rfl:    gatifloxacin (ZYMAXID) 0.5 % SOLN, Place 1 drop into the right eye 4 (four) times daily. (Patient not taking: Reported on 05/02/2022), Disp: , Rfl:    ketorolac (ACULAR) 0.5 % ophthalmic solution, Place 1 drop into the right eye 4 (four) times daily. (Patient not taking: Reported on 05/02/2022), Disp: , Rfl:    LORazepam (ATIVAN) 1 MG tablet, Take 2 mg by mouth as needed. Ona tablest as needed for procedures and imaging (Patient not taking: Reported on 05/02/2022), Disp: , Rfl:    prednisoLONE acetate (PRED FORTE) 1 % ophthalmic suspension, Place 1 drop into the right eye 4 (four) times daily. (Patient not taking: Reported on 05/02/2022), Disp: , Rfl:    propranolol (INDERAL) 10 MG tablet, Take 10 mg by  mouth daily as needed. For anxiety (Patient not taking: Reported on 04/03/2022), Disp: , Rfl:   Allergies:  Allergies  Allergen Reactions   Morphine Nausea Only    Past Medical History, Surgical history, Social history, and Family History were reviewed and updated.  Review of Systems: Review of Systems  Constitutional: Negative.   HENT:   Positive for mouth sores.   Eyes: Negative.   Respiratory: Negative.    Cardiovascular: Negative.   Gastrointestinal: Negative.   Endocrine: Negative.   Genitourinary: Negative.    Musculoskeletal:  Positive for arthralgias and back pain.  Skin: Negative.   Neurological: Negative.   Hematological: Negative.   Psychiatric/Behavioral: Negative.      Physical Exam:  height is 6\' 3"  (1.905 m) and weight is 241 lb 0.6 oz (109.3 kg). His oral temperature is 98.3 F (36.8 C). His blood pressure is 118/69 and his pulse is 65. His respiration is 20 and oxygen saturation is 99%.   Wt Readings from Last 3 Encounters:  05/02/22 241 lb 0.6 oz (109.3 kg)  04/03/22 235 lb 0.6 oz (106.6 kg)  03/07/22 237 lb (107.5 kg)    Physical Exam Vitals reviewed.  HENT:     Head: Normocephalic and atraumatic.  Eyes:     Pupils: Pupils are equal, round, and reactive to light.  Cardiovascular:     Rate and Rhythm: Normal rate and regular rhythm.     Heart sounds: Normal heart sounds.  Pulmonary:     Effort: Pulmonary effort is normal.     Breath sounds: Normal breath sounds.  Abdominal:     General: Bowel sounds are normal.     Palpations: Abdomen is soft.  Musculoskeletal:        General: No tenderness or deformity. Normal range of motion.     Cervical back: Normal range of motion.  Lymphadenopathy:     Cervical: No cervical adenopathy.  Skin:    General: Skin is warm and dry.     Findings: No erythema or rash.  Neurological:     Mental Status: He is alert and oriented to person, place, and time.  Psychiatric:        Behavior: Behavior normal.         Thought Content: Thought content normal.        Judgment: Judgment normal.      Lab Results  Component Value Date   WBC 5.6 05/02/2022   HGB 13.2 05/02/2022   HCT 39.3 05/02/2022   MCV 95.9 05/02/2022   PLT 165 05/02/2022  Chemistry      Component Value Date/Time   NA 141 04/03/2022 0853   K 3.7 04/03/2022 0853   CL 109 04/03/2022 0853   CO2 25 04/03/2022 0853   BUN 26 (H) 04/03/2022 0853   CREATININE 0.95 04/03/2022 0853      Component Value Date/Time   CALCIUM 10.5 (H) 04/03/2022 0853   ALKPHOS 116 04/03/2022 0853   AST 10 (L) 04/03/2022 0853   ALT 16 04/03/2022 0853   BILITOT 0.6 04/03/2022 0853      Impression and Plan: Mr. Strole is a very nice 72 year old white male.  He has metastatic renal cell carcinoma.  Thankfully, he is continuing to respond to immunotherapy.   I do not think we need another PET scan probably until June.  We are doing his treatments every 6 weeks now.  I think this is reasonable.  I do not see a problem him having cataract surgery.  Will see him back in 6 weeks.   Volanda Napoleon, MD 4/3/20248:49 AM

## 2022-05-02 NOTE — Patient Instructions (Signed)
[facility]  Discharge Instructions: Thank you for choosing [facility] to provide your oncology and hematology care.   If you have a lab appointment with the Cancer Center, please go directly to the Cancer Center and check in at the registration area.  Wear comfortable clothing and clothing appropriate for easy access to any Portacath or PICC line.   We strive to give you quality time with your provider. You may need to reschedule your appointment if you arrive late (15 or more minutes).  Arriving late affects you and other patients whose appointments are after yours.  Also, if you miss three or more appointments without notifying the office, you may be dismissed from the clinic at the provider's discretion.      For prescription refill requests, have your pharmacy contact our office and allow 72 hours for refills to be completed.    Today you received the following chemotherapy and/or immunotherapy agents Opdivo.      To help prevent nausea and vomiting after your treatment, we encourage you to take your nausea medication as directed.  BELOW ARE SYMPTOMS THAT SHOULD BE REPORTED IMMEDIATELY: *FEVER GREATER THAN 100.4 F (38 C) OR HIGHER *CHILLS OR SWEATING *NAUSEA AND VOMITING THAT IS NOT CONTROLLED WITH YOUR NAUSEA MEDICATION *UNUSUAL SHORTNESS OF BREATH *UNUSUAL BRUISING OR BLEEDING *URINARY PROBLEMS (pain or burning when urinating, or frequent urination) *BOWEL PROBLEMS (unusual diarrhea, constipation, pain near the anus) TENDERNESS IN MOUTH AND THROAT WITH OR WITHOUT PRESENCE OF ULCERS (sore throat, sores in mouth, or a toothache) UNUSUAL RASH, SWELLING OR PAIN  UNUSUAL VAGINAL DISCHARGE OR ITCHING   Items with * indicate a potential emergency and should be followed up as soon as possible or go to the Emergency Department if any problems should occur.  Please show the CHEMOTHERAPY ALERT CARD or IMMUNOTHERAPY ALERT CARD at check-in  to the Emergency Department and triage nurse. Should you have questions after your visit or need to cancel or reschedule your appointment, please contact [facility]  [phone] and follow the prompts.  Office hours are 8:00 a.m. to 4:30 p.m. Monday - Friday. Please note that voicemails left after 4:00 p.m. may not be returned until the following business day.  We are closed weekends and major holidays. You have access to a nurse at all times for urgent questions. Please call the main number to the clinic [phone] and follow the prompts.  For any non-urgent questions, you may also contact your provider using MyChart. We now offer e-Visits for anyone 18 and older to request care online for non-urgent symptoms. For details visit [email or web address].   Also download the MyChart app! Go to the app store, search "MyChart", open the app, select [facility], and log in with your MyChart username and password.   

## 2022-05-03 ENCOUNTER — Other Ambulatory Visit: Payer: Self-pay

## 2022-05-03 LAB — T4: T4, Total: 3.8 ug/dL — ABNORMAL LOW (ref 4.5–12.0)

## 2022-06-13 ENCOUNTER — Inpatient Hospital Stay: Payer: Medicare Other

## 2022-06-13 ENCOUNTER — Inpatient Hospital Stay: Payer: Medicare Other | Attending: Hematology & Oncology

## 2022-06-13 ENCOUNTER — Encounter: Payer: Self-pay | Admitting: Hematology & Oncology

## 2022-06-13 ENCOUNTER — Inpatient Hospital Stay (HOSPITAL_BASED_OUTPATIENT_CLINIC_OR_DEPARTMENT_OTHER): Payer: Medicare Other | Admitting: Hematology & Oncology

## 2022-06-13 ENCOUNTER — Other Ambulatory Visit: Payer: Self-pay

## 2022-06-13 VITALS — BP 103/58 | HR 56 | Resp 18

## 2022-06-13 VITALS — BP 93/67 | HR 67 | Temp 98.0°F | Resp 18 | Ht 75.0 in | Wt 237.0 lb

## 2022-06-13 DIAGNOSIS — E032 Hypothyroidism due to medicaments and other exogenous substances: Secondary | ICD-10-CM | POA: Diagnosis not present

## 2022-06-13 DIAGNOSIS — C641 Malignant neoplasm of right kidney, except renal pelvis: Secondary | ICD-10-CM | POA: Diagnosis present

## 2022-06-13 DIAGNOSIS — C7951 Secondary malignant neoplasm of bone: Secondary | ICD-10-CM | POA: Insufficient documentation

## 2022-06-13 DIAGNOSIS — M7989 Other specified soft tissue disorders: Secondary | ICD-10-CM | POA: Insufficient documentation

## 2022-06-13 DIAGNOSIS — Z79620 Long term (current) use of immunosuppressive biologic: Secondary | ICD-10-CM | POA: Diagnosis not present

## 2022-06-13 DIAGNOSIS — E034 Atrophy of thyroid (acquired): Secondary | ICD-10-CM

## 2022-06-13 DIAGNOSIS — Z5112 Encounter for antineoplastic immunotherapy: Secondary | ICD-10-CM | POA: Insufficient documentation

## 2022-06-13 LAB — CMP (CANCER CENTER ONLY)
ALT: 15 U/L (ref 0–44)
AST: 11 U/L — ABNORMAL LOW (ref 15–41)
Albumin: 3.9 g/dL (ref 3.5–5.0)
Alkaline Phosphatase: 116 U/L (ref 38–126)
Anion gap: 7 (ref 5–15)
BUN: 31 mg/dL — ABNORMAL HIGH (ref 8–23)
CO2: 26 mmol/L (ref 22–32)
Calcium: 10.6 mg/dL — ABNORMAL HIGH (ref 8.9–10.3)
Chloride: 110 mmol/L (ref 98–111)
Creatinine: 0.89 mg/dL (ref 0.61–1.24)
GFR, Estimated: >60 mL/min (ref >60)
Glucose, Bld: 119 mg/dL — ABNORMAL HIGH (ref 70–99)
Potassium: 3.8 mmol/L (ref 3.5–5.1)
Sodium: 143 mmol/L (ref 135–145)
Total Bilirubin: 0.6 mg/dL (ref 0.3–1.2)
Total Protein: 5.8 g/dL — ABNORMAL LOW (ref 6.5–8.1)

## 2022-06-13 LAB — LACTATE DEHYDROGENASE: LDH: 141 U/L (ref 98–192)

## 2022-06-13 LAB — CBC WITH DIFFERENTIAL (CANCER CENTER ONLY)
Abs Immature Granulocytes: 0.01 10*3/uL (ref 0.00–0.07)
Basophils Absolute: 0 10*3/uL (ref 0.0–0.1)
Basophils Relative: 1 %
Eosinophils Absolute: 0.1 10*3/uL (ref 0.0–0.5)
Eosinophils Relative: 1 %
HCT: 40.3 % (ref 39.0–52.0)
Hemoglobin: 13.4 g/dL (ref 13.0–17.0)
Immature Granulocytes: 0 %
Lymphocytes Relative: 20 %
Lymphs Abs: 1.2 10*3/uL (ref 0.7–4.0)
MCH: 32.3 pg (ref 26.0–34.0)
MCHC: 33.3 g/dL (ref 30.0–36.0)
MCV: 97.1 fL (ref 80.0–100.0)
Monocytes Absolute: 0.6 10*3/uL (ref 0.1–1.0)
Monocytes Relative: 11 %
Neutro Abs: 3.9 10*3/uL (ref 1.7–7.7)
Neutrophils Relative %: 67 %
Platelet Count: 163 10*3/uL (ref 150–400)
RBC: 4.15 MIL/uL — ABNORMAL LOW (ref 4.22–5.81)
RDW: 13.2 % (ref 11.5–15.5)
WBC Count: 5.8 10*3/uL (ref 4.0–10.5)
nRBC: 0 % (ref 0.0–0.2)

## 2022-06-13 LAB — TSH: TSH: 1.456 u[IU]/mL (ref 0.350–4.500)

## 2022-06-13 MED ORDER — SODIUM CHLORIDE 0.9 % IV SOLN
480.0000 mg | Freq: Once | INTRAVENOUS | Status: AC
  Administered 2022-06-13: 480 mg via INTRAVENOUS
  Filled 2022-06-13: qty 48

## 2022-06-13 MED ORDER — SODIUM CHLORIDE 0.9 % IV SOLN: Freq: Once | INTRAVENOUS | Status: AC

## 2022-06-13 NOTE — Progress Notes (Signed)
Hematology and Oncology Follow Up Visit  Francis Cunningham [id number] [birth year] 72 y.o. 06/13/2022   Principle Diagnosis:  Metastatic renal cell carcinoma-bone only metastasis  Current Therapy:   Maintenance nivolumab-Q 6-week dosing -- s/p cycle #24 --changed to 6-week dosing on 04/03/2022     Interim History:  Francis Cunningham is back for follow-up.  He is looking quite good.  He feels good.  He had cataract surgery for both eyes.  This went well quite well.  He has had no problems with bowels or bladder.  He has had no problems with nausea or vomiting.  He does have little bit of leg swelling, more so on the right leg than left leg.  His last TSH was 1.54.  He has had no headache.  There is been no problems with his oral cavity.  His teeth have been doing fairly well.  He has had no pain with his dentition.  There is been no problems with rashes.  He has had no bleeding.  Overall, I would have said that his performance status is probably ECOG 0.   Medications:  Current Outpatient Medications:    acetaminophen (TYLENOL) 325 MG tablet, Take 325 mg by mouth every 6 (six) hours as needed., Disp: , Rfl:    alfuzosin (UROXATRAL) 10 MG 24 hr tablet, Take 10 mg by mouth at bedtime., Disp: , Rfl:    ascorbic acid (VITAMIN C) 500 MG tablet, Take 500 mg by mouth in the morning., Disp: , Rfl:    Cholecalciferol 25 MCG (1000 UT) tablet, Take 2,000 Units by mouth daily., Disp: , Rfl:    escitalopram (LEXAPRO) 20 MG tablet, Take 20 mg by mouth daily., Disp: , Rfl:    finasteride (PROSCAR) 5 MG tablet, Take 5 mg by mouth daily., Disp: , Rfl:    gatifloxacin (ZYMAXID) 0.5 % SOLN, Place 1 drop into the right eye 4 (four) times daily., Disp: , Rfl:    guaiFENesin (MUCINEX) 600 MG 12 hr tablet, Take 600 mg by mouth in the morning and at bedtime., Disp: , Rfl:    Hypertonic Nasal Wash (SINUS RINSE) PACK, Irrigate with as directed., Disp: , Rfl:    ketorolac (ACULAR) 0.5 % ophthalmic solution, Place 1  drop into the right eye 4 (four) times daily., Disp: , Rfl:    LORazepam (ATIVAN) 1 MG tablet, Take 2 mg by mouth as needed. Ona tablest as needed for procedures and imaging, Disp: , Rfl:    Magnesium Carbonate POWD, Take 325 mg by mouth daily. 1 1/2  teaspoons daily., Disp: , Rfl:    melatonin 3 MG TABS tablet, Take 3 mg by mouth at bedtime., Disp: , Rfl:    midodrine (PROAMATINE) 2.5 MG tablet, TAKE ONE (1) TABLET BY MOUTH 3 TIMES DAILY, Disp: 90 tablet, Rfl: 8   montelukast (SINGULAIR) 10 MG tablet, Take 1 tablet by mouth daily., Disp: , Rfl:    Multiple Vitamin (MULTI-VITAMINS) TABS, Take 1 tablet by mouth daily., Disp: , Rfl:    nivolumab (OPDIVO) 100 MG/10ML SOLN chemo injection, Inject into the vein., Disp: , Rfl:    Omega-3 Fatty Acids (FISH OIL PO), Take 5 mLs by mouth daily., Disp: , Rfl:    polyethylene glycol powder (GLYCOLAX/MIRALAX) 17 GM/SCOOP powder, Take 17 g by mouth at bedtime., Disp: , Rfl:    Potassium Citrate-Citric Acid 3300-1002 MG PACK, Take by mouth daily., Disp: , Rfl:    prednisoLONE acetate (PRED FORTE) 1 % ophthalmic suspension, Place 1 drop into  the right eye 4 (four) times daily., Disp: , Rfl:    predniSONE (DELTASONE) 10 MG tablet, Take 1 tablet (10 mg total) by mouth daily., Disp: 90 tablet, Rfl: 6   Probiotic Product (PROBIOTIC ACIDOPHILUS BEADS PO), Take by mouth daily., Disp: , Rfl:    propranolol (INDERAL) 10 MG tablet, Take 10 mg by mouth daily as needed. For anxiety, Disp: , Rfl:    vitamin E 180 MG (400 UNITS) capsule, Take 400 Units by mouth every morning., Disp: , Rfl:   Allergies:  Allergies  Allergen Reactions   Morphine Nausea Only    Past Medical History, Surgical history, Social history, and Family History were reviewed and updated.  Review of Systems: Review of Systems  Constitutional: Negative.   HENT:   Positive for mouth sores.   Eyes: Negative.   Respiratory: Negative.    Cardiovascular: Negative.   Gastrointestinal: Negative.    Endocrine: Negative.   Genitourinary: Negative.    Musculoskeletal:  Positive for arthralgias and back pain.  Skin: Negative.   Neurological: Negative.   Hematological: Negative.   Psychiatric/Behavioral: Negative.      Physical Exam: Temperature is 98.  Pulse 67.  Blood pressure 93/67.  Weight is 237 pounds.  Wt Readings from Last 3 Encounters:  05/02/22 241 lb 0.6 oz (109.3 kg)  04/03/22 235 lb 0.6 oz (106.6 kg)  03/07/22 237 lb (107.5 kg)    Physical Exam Vitals reviewed.  HENT:     Head: Normocephalic and atraumatic.  Eyes:     Pupils: Pupils are equal, round, and reactive to light.  Cardiovascular:     Rate and Rhythm: Normal rate and regular rhythm.     Heart sounds: Normal heart sounds.  Pulmonary:     Effort: Pulmonary effort is normal.     Breath sounds: Normal breath sounds.  Abdominal:     General: Bowel sounds are normal.     Palpations: Abdomen is soft.  Musculoskeletal:        General: No tenderness or deformity. Normal range of motion.     Cervical back: Normal range of motion.  Lymphadenopathy:     Cervical: No cervical adenopathy.  Skin:    General: Skin is warm and dry.     Findings: No erythema or rash.  Neurological:     Mental Status: He is alert and oriented to person, place, and time.  Psychiatric:        Behavior: Behavior normal.        Thought Content: Thought content normal.        Judgment: Judgment normal.      Lab Results  Component Value Date   WBC 5.8 06/13/2022   HGB 13.4 06/13/2022   HCT 40.3 06/13/2022   MCV 97.1 06/13/2022   PLT 163 06/13/2022     Chemistry      Component Value Date/Time   NA 141 05/02/2022 0804   K 4.1 05/02/2022 0804   CL 108 05/02/2022 0804   CO2 26 05/02/2022 0804   BUN 32 (H) 05/02/2022 0804   CREATININE 0.86 05/02/2022 0804      Component Value Date/Time   CALCIUM 10.2 05/02/2022 0804   ALKPHOS 105 05/02/2022 0804   AST 12 (L) 05/02/2022 0804   ALT 17 05/02/2022 0804   BILITOT 0.5  05/02/2022 0804      Impression and Plan: Francis Cunningham is a very nice 72 year old white male.  He has metastatic renal cell carcinoma.  Thankfully, he is continuing to  respond to immunotherapy.   From my point of view, I think everything is doing quite well.  I think we can probably hold on a PET scan probably until July.  Just happy that he has done so well.  His quality of life is so good right now.  I am glad to get through his cataract surgery.  Will see him back in 6 weeks.   [name], MD 5/15/20249:45 AM

## 2022-06-13 NOTE — Patient Instructions (Signed)
[facility]  Discharge Instructions: Thank you for choosing [facility] to provide your oncology and hematology care.   If you have a lab appointment with the Cancer Center, please go directly to the Cancer Center and check in at the registration area.  Wear comfortable clothing and clothing appropriate for easy access to any Portacath or PICC line.   We strive to give you quality time with your provider. You may need to reschedule your appointment if you arrive late (15 or more minutes).  Arriving late affects you and other patients whose appointments are after yours.  Also, if you miss three or more appointments without notifying the office, you may be dismissed from the clinic at the provider's discretion.      For prescription refill requests, have your pharmacy contact our office and allow 72 hours for refills to be completed.    Today you received the following chemotherapy and/or immunotherapy agents Opdivo.      To help prevent nausea and vomiting after your treatment, we encourage you to take your nausea medication as directed.  BELOW ARE SYMPTOMS THAT SHOULD BE REPORTED IMMEDIATELY: *FEVER GREATER THAN 100.4 F (38 C) OR HIGHER *CHILLS OR SWEATING *NAUSEA AND VOMITING THAT IS NOT CONTROLLED WITH YOUR NAUSEA MEDICATION *UNUSUAL SHORTNESS OF BREATH *UNUSUAL BRUISING OR BLEEDING *URINARY PROBLEMS (pain or burning when urinating, or frequent urination) *BOWEL PROBLEMS (unusual diarrhea, constipation, pain near the anus) TENDERNESS IN MOUTH AND THROAT WITH OR WITHOUT PRESENCE OF ULCERS (sore throat, sores in mouth, or a toothache) UNUSUAL RASH, SWELLING OR PAIN  UNUSUAL VAGINAL DISCHARGE OR ITCHING   Items with * indicate a potential emergency and should be followed up as soon as possible or go to the Emergency Department if any problems should occur.  Please show the CHEMOTHERAPY ALERT CARD or IMMUNOTHERAPY ALERT CARD at check-in  to the Emergency Department and triage nurse. Should you have questions after your visit or need to cancel or reschedule your appointment, please contact [facility]  [phone] and follow the prompts.  Office hours are 8:00 a.m. to 4:30 p.m. Monday - Friday. Please note that voicemails left after 4:00 p.m. may not be returned until the following business day.  We are closed weekends and major holidays. You have access to a nurse at all times for urgent questions. Please call the main number to the clinic [phone] and follow the prompts.  For any non-urgent questions, you may also contact your provider using MyChart. We now offer e-Visits for anyone 18 and older to request care online for non-urgent symptoms. For details visit [email or web address].   Also download the MyChart app! Go to the app store, search "MyChart", open the app, select , and log in with your MyChart username and password.   

## 2022-06-14 ENCOUNTER — Other Ambulatory Visit: Payer: Self-pay

## 2022-07-05 ENCOUNTER — Other Ambulatory Visit: Payer: Self-pay | Admitting: Oncology

## 2022-07-25 ENCOUNTER — Inpatient Hospital Stay: Payer: Medicare Other

## 2022-07-25 ENCOUNTER — Inpatient Hospital Stay (HOSPITAL_BASED_OUTPATIENT_CLINIC_OR_DEPARTMENT_OTHER): Payer: Medicare Other | Admitting: Hematology & Oncology

## 2022-07-25 ENCOUNTER — Inpatient Hospital Stay: Payer: Medicare Other | Attending: Hematology & Oncology

## 2022-07-25 ENCOUNTER — Encounter: Payer: Self-pay | Admitting: Hematology & Oncology

## 2022-07-25 VITALS — BP 122/68 | HR 68 | Temp 98.0°F | Resp 20 | Ht 75.0 in | Wt 236.0 lb

## 2022-07-25 VITALS — BP 109/67 | HR 59 | Resp 18

## 2022-07-25 DIAGNOSIS — C7951 Secondary malignant neoplasm of bone: Secondary | ICD-10-CM | POA: Insufficient documentation

## 2022-07-25 DIAGNOSIS — Z79620 Long term (current) use of immunosuppressive biologic: Secondary | ICD-10-CM | POA: Insufficient documentation

## 2022-07-25 DIAGNOSIS — M25471 Effusion, right ankle: Secondary | ICD-10-CM | POA: Insufficient documentation

## 2022-07-25 DIAGNOSIS — Z7952 Long term (current) use of systemic steroids: Secondary | ICD-10-CM | POA: Insufficient documentation

## 2022-07-25 DIAGNOSIS — M25472 Effusion, left ankle: Secondary | ICD-10-CM | POA: Insufficient documentation

## 2022-07-25 DIAGNOSIS — C641 Malignant neoplasm of right kidney, except renal pelvis: Secondary | ICD-10-CM

## 2022-07-25 DIAGNOSIS — E032 Hypothyroidism due to medicaments and other exogenous substances: Secondary | ICD-10-CM

## 2022-07-25 DIAGNOSIS — Z5112 Encounter for antineoplastic immunotherapy: Secondary | ICD-10-CM | POA: Diagnosis present

## 2022-07-25 LAB — CBC WITH DIFFERENTIAL (CANCER CENTER ONLY)
Abs Immature Granulocytes: 0.01 10*3/uL (ref 0.00–0.07)
Basophils Absolute: 0 10*3/uL (ref 0.0–0.1)
Basophils Relative: 1 %
Eosinophils Absolute: 0.1 10*3/uL (ref 0.0–0.5)
Eosinophils Relative: 1 %
HCT: 40.8 % (ref 39.0–52.0)
Hemoglobin: 13.5 g/dL (ref 13.0–17.0)
Immature Granulocytes: 0 %
Lymphocytes Relative: 27 %
Lymphs Abs: 1.3 10*3/uL (ref 0.7–4.0)
MCH: 32.2 pg (ref 26.0–34.0)
MCHC: 33.1 g/dL (ref 30.0–36.0)
MCV: 97.4 fL (ref 80.0–100.0)
Monocytes Absolute: 0.6 10*3/uL (ref 0.1–1.0)
Monocytes Relative: 12 %
Neutro Abs: 2.9 10*3/uL (ref 1.7–7.7)
Neutrophils Relative %: 59 %
Platelet Count: 172 10*3/uL (ref 150–400)
RBC: 4.19 MIL/uL — ABNORMAL LOW (ref 4.22–5.81)
RDW: 13 % (ref 11.5–15.5)
WBC Count: 4.9 10*3/uL (ref 4.0–10.5)
nRBC: 0 % (ref 0.0–0.2)

## 2022-07-25 LAB — CMP (CANCER CENTER ONLY)
ALT: 14 U/L (ref 0–44)
AST: 10 U/L — ABNORMAL LOW (ref 15–41)
Albumin: 3.8 g/dL (ref 3.5–5.0)
Alkaline Phosphatase: 100 U/L (ref 38–126)
Anion gap: 6 (ref 5–15)
BUN: 30 mg/dL — ABNORMAL HIGH (ref 8–23)
CO2: 27 mmol/L (ref 22–32)
Calcium: 10.7 mg/dL — ABNORMAL HIGH (ref 8.9–10.3)
Chloride: 109 mmol/L (ref 98–111)
Creatinine: 0.92 mg/dL (ref 0.61–1.24)
GFR, Estimated: >60 mL/min (ref >60)
Glucose, Bld: 91 mg/dL (ref 70–99)
Potassium: 4.2 mmol/L (ref 3.5–5.1)
Sodium: 142 mmol/L (ref 135–145)
Total Bilirubin: 0.7 mg/dL (ref 0.3–1.2)
Total Protein: 5.6 g/dL — ABNORMAL LOW (ref 6.5–8.1)

## 2022-07-25 LAB — LACTATE DEHYDROGENASE: LDH: 133 U/L (ref 98–192)

## 2022-07-25 LAB — TSH: TSH: 1.568 u[IU]/mL (ref 0.350–4.500)

## 2022-07-25 MED ORDER — PREDNISONE 10 MG PO TABS: 5.0000 mg | ORAL_TABLET | Freq: Every day | ORAL | 6 refills | Status: AC

## 2022-07-25 MED ORDER — SODIUM CHLORIDE 0.9 % IV SOLN
480.0000 mg | Freq: Once | INTRAVENOUS | Status: AC
  Administered 2022-07-25: 480 mg via INTRAVENOUS
  Filled 2022-07-25: qty 48

## 2022-07-25 MED ORDER — SODIUM CHLORIDE 0.9 % IV SOLN: Freq: Once | INTRAVENOUS | Status: AC

## 2022-07-25 NOTE — Addendum Note (Signed)
Addended by: Arlan Organ R on: 07/25/2022 12:32 PM   Modules accepted: Orders

## 2022-07-25 NOTE — Patient Instructions (Signed)
[facility]  Discharge Instructions: Thank you for choosing [facility] to provide your oncology and hematology care.   If you have a lab appointment with the Cancer Center, please go directly to the Cancer Center and check in at the registration area.  Wear comfortable clothing and clothing appropriate for easy access to any Portacath or PICC line.   We strive to give you quality time with your provider. You may need to reschedule your appointment if you arrive late (15 or more minutes).  Arriving late affects you and other patients whose appointments are after yours.  Also, if you miss three or more appointments without notifying the office, you may be dismissed from the clinic at the provider's discretion.      For prescription refill requests, have your pharmacy contact our office and allow 72 hours for refills to be completed.    Today you received the following chemotherapy and/or immunotherapy agents Opdivo.      To help prevent nausea and vomiting after your treatment, we encourage you to take your nausea medication as directed.  BELOW ARE SYMPTOMS THAT SHOULD BE REPORTED IMMEDIATELY: *FEVER GREATER THAN 100.4 F (38 C) OR HIGHER *CHILLS OR SWEATING *NAUSEA AND VOMITING THAT IS NOT CONTROLLED WITH YOUR NAUSEA MEDICATION *UNUSUAL SHORTNESS OF BREATH *UNUSUAL BRUISING OR BLEEDING *URINARY PROBLEMS (pain or burning when urinating, or frequent urination) *BOWEL PROBLEMS (unusual diarrhea, constipation, pain near the anus) TENDERNESS IN MOUTH AND THROAT WITH OR WITHOUT PRESENCE OF ULCERS (sore throat, sores in mouth, or a toothache) UNUSUAL RASH, SWELLING OR PAIN  UNUSUAL VAGINAL DISCHARGE OR ITCHING   Items with * indicate a potential emergency and should be followed up as soon as possible or go to the Emergency Department if any problems should occur.  Please show the CHEMOTHERAPY ALERT CARD or IMMUNOTHERAPY ALERT CARD at check-in  to the Emergency Department and triage nurse. Should you have questions after your visit or need to cancel or reschedule your appointment, please contact [facility]  [phone] and follow the prompts.  Office hours are 8:00 a.m. to 4:30 p.m. Monday - Friday. Please note that voicemails left after 4:00 p.m. may not be returned until the following business day.  We are closed weekends and major holidays. You have access to a nurse at all times for urgent questions. Please call the main number to the clinic [phone] and follow the prompts.  For any non-urgent questions, you may also contact your provider using MyChart. We now offer e-Visits for anyone 18 and older to request care online for non-urgent symptoms. For details visit [email or web address].   Also download the MyChart app! Go to the app store, search "MyChart", open the app, select [facility], and log in with your MyChart username and password.   

## 2022-07-25 NOTE — Progress Notes (Signed)
Hematology and Oncology Follow Up Visit  Francis Cunningham [id number] 1950/04/30 72 y.o. 07/25/2022   Principle Diagnosis:  Metastatic renal cell carcinoma-bone only metastasis  Current Therapy:   Maintenance nivolumab-Q 6-week dosing -- s/p cycle #24 --changed to 6-week dosing on 04/03/2022     Interim History:  [name] is back for follow-up.  He is doing quite well.  He comes in with his wife.  He had cataract surgery I seem to do well for him.  His wife will have cataract surgery in August.  Because of the hot weather, has not been able to do as much.  He still tries to walk.  He is on prednisone 10 mg a day.  He is not sure why he is on the prednisone.  As such, we will try to taper the prednisone down.  I will put him on 5 mg a day.  He has had no change in bowel or bladder habits.  There is been no bleeding.  He had a little bit of swelling in the ankles.  He has had no rashes.  Is been no cough or shortness of breath.  He has had no fever.  His last TSH was 1.46.    Overall, I was his performance status is probably ECOG 1.  Medications:  Current Outpatient Medications:    acetaminophen (TYLENOL) 325 MG tablet, Take 325 mg by mouth every 6 (six) hours as needed., Disp: , Rfl:    alfuzosin (UROXATRAL) 10 MG 24 hr tablet, Take 10 mg by mouth at bedtime., Disp: , Rfl:    ascorbic acid (VITAMIN C) 500 MG tablet, Take 500 mg by mouth in the morning., Disp: , Rfl:    Cholecalciferol 25 MCG (1000 UT) tablet, Take 2,000 Units by mouth daily., Disp: , Rfl:    escitalopram (LEXAPRO) 20 MG tablet, Take 20 mg by mouth daily., Disp: , Rfl:    finasteride (PROSCAR) 5 MG tablet, Take 5 mg by mouth daily., Disp: , Rfl:    guaiFENesin (MUCINEX) 600 MG 12 hr tablet, Take 600 mg by mouth in the morning and at bedtime., Disp: , Rfl:    Hypertonic Nasal Wash (SINUS RINSE) PACK, Irrigate with as directed., Disp: , Rfl:    Magnesium Carbonate POWD, Take 325 mg by mouth daily. 1 1/2  teaspoons  daily., Disp: , Rfl:    melatonin 3 MG TABS tablet, Take 3 mg by mouth at bedtime., Disp: , Rfl:    midodrine (PROAMATINE) 2.5 MG tablet, TAKE ONE (1) TABLET BY MOUTH 3 TIMES DAILY, Disp: 90 tablet, Rfl: 8   montelukast (SINGULAIR) 10 MG tablet, Take 1 tablet by mouth daily., Disp: , Rfl:    Multiple Vitamin (MULTI-VITAMINS) TABS, Take 1 tablet by mouth daily., Disp: , Rfl:    nivolumab (OPDIVO) 100 MG/10ML SOLN chemo injection, Inject into the vein., Disp: , Rfl:    Omega-3 Fatty Acids (FISH OIL PO), Take 5 mLs by mouth daily., Disp: , Rfl:    polyethylene glycol powder (GLYCOLAX/MIRALAX) 17 GM/SCOOP powder, Take 17 g by mouth at bedtime., Disp: , Rfl:    Potassium Citrate-Citric Acid 3300-1002 MG PACK, Take by mouth daily., Disp: , Rfl:    predniSONE (DELTASONE) 10 MG tablet, Take 1 tablet (10 mg total) by mouth daily., Disp: 90 tablet, Rfl: 6   Probiotic Product (PROBIOTIC ACIDOPHILUS BEADS PO), Take by mouth daily., Disp: , Rfl:    vitamin E 180 MG (400 UNITS) capsule, Take 400 Units by mouth every  morning., Disp: , Rfl:    LORazepam (ATIVAN) 1 MG tablet, Take 2 mg by mouth as needed. Ona tablest as needed for procedures and imaging (Patient not taking: Reported on 07/25/2022), Disp: , Rfl:   Allergies:  Allergies  Allergen Reactions   Morphine Nausea Only    Past Medical History, Surgical history, Social history, and Family History were reviewed and updated.  Review of Systems: Review of Systems  Constitutional: Negative.   HENT:   Positive for mouth sores.   Eyes: Negative.   Respiratory: Negative.    Cardiovascular: Negative.   Gastrointestinal: Negative.   Endocrine: Negative.   Genitourinary: Negative.    Musculoskeletal:  Positive for arthralgias and back pain.  Skin: Negative.   Neurological: Negative.   Hematological: Negative.   Psychiatric/Behavioral: Negative.      Physical Exam: Temperature is 98.  Pulse 68.  Blood pressure 122/68.  Weight is 236 pounds.    Wt  Readings from Last 3 Encounters:  07/25/22 236 lb (107 kg)  06/13/22 237 lb (107.5 kg)  05/02/22 241 lb 0.6 oz (109.3 kg)    Physical Exam Vitals reviewed.  HENT:     Head: Normocephalic and atraumatic.  Eyes:     Pupils: Pupils are equal, round, and reactive to light.  Cardiovascular:     Rate and Rhythm: Normal rate and regular rhythm.     Heart sounds: Normal heart sounds.  Pulmonary:     Effort: Pulmonary effort is normal.     Breath sounds: Normal breath sounds.  Abdominal:     General: Bowel sounds are normal.     Palpations: Abdomen is soft.  Musculoskeletal:        General: No tenderness or deformity. Normal range of motion.     Cervical back: Normal range of motion.  Lymphadenopathy:     Cervical: No cervical adenopathy.  Skin:    General: Skin is warm and dry.     Findings: No erythema or rash.  Neurological:     Mental Status: He is alert and oriented to person, place, and time.  Psychiatric:        Behavior: Behavior normal.        Thought Content: Thought content normal.        Judgment: Judgment normal.      Lab Results  Component Value Date   WBC 4.9 07/25/2022   HGB 13.5 07/25/2022   HCT 40.8 07/25/2022   MCV 97.4 07/25/2022   PLT 172 07/25/2022     Chemistry      Component Value Date/Time   NA 143 06/13/2022 0914   K 3.8 06/13/2022 0914   CL 110 06/13/2022 0914   CO2 26 06/13/2022 0914   BUN 31 (H) 06/13/2022 0914   CREATININE 0.89 06/13/2022 0914      Component Value Date/Time   CALCIUM 10.6 (H) 06/13/2022 0914   ALKPHOS 116 06/13/2022 0914   AST 11 (L) 06/13/2022 0914   ALT 15 06/13/2022 0914   BILITOT 0.6 06/13/2022 0914      Impression and Plan: [name] is a very nice 72 year old white male.  He has metastatic renal cell carcinoma.  Thankfully, he is continuing to respond to immunotherapy.   From my point of view, I think everything is doing quite well.  We will set the PET scan up for him before see him back.  His last PET  scan was back in March.  I had to mention that he was having some pain  in the right hip area.  He saw his primary care doctor.  He was given some exercises.  Sound like it may have been muscular in nature.    Francis Macho, MD 6/26/20248:23 AM

## 2022-07-26 ENCOUNTER — Other Ambulatory Visit: Payer: Self-pay

## 2022-07-27 LAB — T4: T4, Total: 3.7 ug/dL — ABNORMAL LOW (ref 4.5–12.0)

## 2022-08-21 ENCOUNTER — Encounter: Payer: Self-pay | Admitting: Hematology & Oncology

## 2022-08-30 ENCOUNTER — Encounter (HOSPITAL_COMMUNITY)
Admission: RE | Admit: 2022-08-30 | Discharge: 2022-08-30 | Disposition: A | Discharge status: Home / Self Care | Payer: Medicare Other | Source: Ambulatory Visit | Attending: Hematology & Oncology | Admitting: Hematology & Oncology

## 2022-08-30 DIAGNOSIS — C641 Malignant neoplasm of right kidney, except renal pelvis: Secondary | ICD-10-CM | POA: Diagnosis not present

## 2022-08-30 LAB — GLUCOSE, CAPILLARY: Glucose-Capillary: 84 mg/dL (ref 70–99)

## 2022-08-30 MED ORDER — FLUDEOXYGLUCOSE F - 18 (FDG) INJECTION
11.8000 | Freq: Once | INTRAVENOUS | Status: AC | PRN
  Administered 2022-08-30: 11.8 via INTRAVENOUS

## 2022-09-05 ENCOUNTER — Encounter: Payer: Self-pay | Admitting: *Deleted

## 2022-09-05 ENCOUNTER — Encounter: Payer: Self-pay | Admitting: Hematology & Oncology

## 2022-09-05 ENCOUNTER — Inpatient Hospital Stay: Payer: Medicare Other

## 2022-09-05 ENCOUNTER — Other Ambulatory Visit: Payer: Self-pay

## 2022-09-05 ENCOUNTER — Inpatient Hospital Stay (HOSPITAL_BASED_OUTPATIENT_CLINIC_OR_DEPARTMENT_OTHER): Payer: Medicare Other | Admitting: Hematology & Oncology

## 2022-09-05 ENCOUNTER — Inpatient Hospital Stay: Payer: Medicare Other | Attending: Hematology & Oncology

## 2022-09-05 VITALS — BP 91/55 | HR 63 | Resp 18

## 2022-09-05 DIAGNOSIS — C641 Malignant neoplasm of right kidney, except renal pelvis: Secondary | ICD-10-CM | POA: Diagnosis present

## 2022-09-05 DIAGNOSIS — M7989 Other specified soft tissue disorders: Secondary | ICD-10-CM | POA: Diagnosis not present

## 2022-09-05 DIAGNOSIS — Z7952 Long term (current) use of systemic steroids: Secondary | ICD-10-CM | POA: Insufficient documentation

## 2022-09-05 DIAGNOSIS — Z79620 Long term (current) use of immunosuppressive biologic: Secondary | ICD-10-CM | POA: Insufficient documentation

## 2022-09-05 DIAGNOSIS — C7951 Secondary malignant neoplasm of bone: Secondary | ICD-10-CM | POA: Diagnosis present

## 2022-09-05 DIAGNOSIS — Z5112 Encounter for antineoplastic immunotherapy: Secondary | ICD-10-CM | POA: Diagnosis present

## 2022-09-05 LAB — CBC WITH DIFFERENTIAL (CANCER CENTER ONLY)
Abs Immature Granulocytes: 0 10*3/uL (ref 0.00–0.07)
Basophils Absolute: 0 10*3/uL (ref 0.0–0.1)
Basophils Relative: 0 %
Eosinophils Absolute: 0.1 10*3/uL (ref 0.0–0.5)
Eosinophils Relative: 2 %
HCT: 39.8 % (ref 39.0–52.0)
Hemoglobin: 13.4 g/dL (ref 13.0–17.0)
Immature Granulocytes: 0 %
Lymphocytes Relative: 27 %
Lymphs Abs: 1.3 10*3/uL (ref 0.7–4.0)
MCH: 32.7 pg (ref 26.0–34.0)
MCHC: 33.7 g/dL (ref 30.0–36.0)
MCV: 97.1 fL (ref 80.0–100.0)
Monocytes Absolute: 0.6 10*3/uL (ref 0.1–1.0)
Monocytes Relative: 13 %
Neutro Abs: 2.8 10*3/uL (ref 1.7–7.7)
Neutrophils Relative %: 58 %
Platelet Count: 178 10*3/uL (ref 150–400)
RBC: 4.1 MIL/uL — ABNORMAL LOW (ref 4.22–5.81)
RDW: 12.7 % (ref 11.5–15.5)
WBC Count: 4.9 10*3/uL (ref 4.0–10.5)
nRBC: 0 % (ref 0.0–0.2)

## 2022-09-05 LAB — CMP (CANCER CENTER ONLY)
ALT: 14 U/L (ref 0–44)
AST: 9 U/L — ABNORMAL LOW (ref 15–41)
Albumin: 3.6 g/dL (ref 3.5–5.0)
Alkaline Phosphatase: 103 U/L (ref 38–126)
Anion gap: 7 (ref 5–15)
BUN: 29 mg/dL — ABNORMAL HIGH (ref 8–23)
CO2: 27 mmol/L (ref 22–32)
Calcium: 10.2 mg/dL (ref 8.9–10.3)
Chloride: 108 mmol/L (ref 98–111)
Creatinine: 0.95 mg/dL (ref 0.61–1.24)
GFR, Estimated: >60 mL/min (ref >60)
Glucose, Bld: 91 mg/dL (ref 70–99)
Potassium: 4 mmol/L (ref 3.5–5.1)
Sodium: 142 mmol/L (ref 135–145)
Total Bilirubin: 0.4 mg/dL (ref 0.3–1.2)
Total Protein: 6 g/dL — ABNORMAL LOW (ref 6.5–8.1)

## 2022-09-05 LAB — CORTISOL: Cortisol, Plasma: 2.7 ug/dL

## 2022-09-05 LAB — LACTATE DEHYDROGENASE: LDH: 133 U/L (ref 98–192)

## 2022-09-05 LAB — TSH: TSH: 2.36 u[IU]/mL (ref 0.350–4.500)

## 2022-09-05 MED ORDER — SODIUM CHLORIDE 0.9 % IV SOLN: Freq: Once | INTRAVENOUS | Status: AC

## 2022-09-05 MED ORDER — SODIUM CHLORIDE 0.9 % IV SOLN
480.0000 mg | Freq: Once | INTRAVENOUS | Status: AC
  Administered 2022-09-05: 480 mg via INTRAVENOUS
  Filled 2022-09-05: qty 48

## 2022-09-05 NOTE — Progress Notes (Signed)
Hematology and Oncology Follow Up Visit  Francis Cunningham [id number] 1951-01-09 72 y.o. 09/05/2022   Principle Diagnosis:  Metastatic renal cell carcinoma-bone only metastasis  Current Therapy:   Maintenance nivolumab-Q 6-week dosing -- s/p cycle #24 --changed to 6-week dosing on 04/03/2022     Interim History:  [name] is back for follow-up.  As always, he is doing pretty well.  He really has had no complaints.  He had cataract surgery.  He got through this quite nicely.  He is on a prednisone taper.  These are 5 mg a day now.  He did have a PET scan that was done.  This was done on 08/30/2022.  The result did not show any evidence of recurrent disease.  He and his wife both try to exercise.  His appetite is doing well.  He has had no rashes.  He still has a bit of leg swelling, more so on the right side.  Overall, there is no cough or shortness of breath.  He has had no fever.  He has had no dysphagia or odynophagia.  He does have some sinus issues which are chronic.  Overall, I would say that or his performance status is ECOG 1.   Medications:  Current Outpatient Medications:    acetaminophen (TYLENOL) 325 MG tablet, Take 325 mg by mouth every 6 (six) hours as needed., Disp: , Rfl:    alfuzosin (UROXATRAL) 10 MG 24 hr tablet, Take 10 mg by mouth at bedtime., Disp: , Rfl:    ascorbic acid (VITAMIN C) 500 MG tablet, Take 500 mg by mouth in the morning., Disp: , Rfl:    Cholecalciferol 25 MCG (1000 UT) tablet, Take 2,000 Units by mouth daily., Disp: , Rfl:    escitalopram (LEXAPRO) 20 MG tablet, Take 20 mg by mouth daily., Disp: , Rfl:    finasteride (PROSCAR) 5 MG tablet, Take 5 mg by mouth daily., Disp: , Rfl:    guaiFENesin (MUCINEX) 600 MG 12 hr tablet, Take 600 mg by mouth in the morning and at bedtime., Disp: , Rfl:    Hypertonic Nasal Wash (SINUS RINSE) PACK, Irrigate with as directed., Disp: , Rfl:    LORazepam (ATIVAN) 1 MG tablet, Take 2 mg by mouth as needed. Ona  tablest as needed for procedures and imaging, Disp: , Rfl:    Magnesium Carbonate POWD, Take 325 mg by mouth daily. 1 1/2  teaspoons daily., Disp: , Rfl:    melatonin 3 MG TABS tablet, Take 3 mg by mouth at bedtime., Disp: , Rfl:    midodrine (PROAMATINE) 2.5 MG tablet, TAKE ONE (1) TABLET BY MOUTH 3 TIMES DAILY, Disp: 90 tablet, Rfl: 8   montelukast (SINGULAIR) 10 MG tablet, Take 1 tablet by mouth daily., Disp: , Rfl:    Multiple Vitamin (MULTI-VITAMINS) TABS, Take 1 tablet by mouth daily., Disp: , Rfl:    nivolumab (OPDIVO) 100 MG/10ML SOLN chemo injection, Inject into the vein., Disp: , Rfl:    Omega-3 Fatty Acids (FISH OIL PO), Take 5 mLs by mouth daily., Disp: , Rfl:    polyethylene glycol powder (GLYCOLAX/MIRALAX) 17 GM/SCOOP powder, Take 17 g by mouth at bedtime., Disp: , Rfl:    Potassium Citrate-Citric Acid 3300-1002 MG PACK, Take by mouth daily., Disp: , Rfl:    predniSONE (DELTASONE) 10 MG tablet, Take 0.5 tablets (5 mg total) by mouth daily., Disp: 90 tablet, Rfl: 6   Probiotic Product (PROBIOTIC ACIDOPHILUS BEADS PO), Take by mouth daily., Disp: , Rfl:  vitamin E 180 MG (400 UNITS) capsule, Take 400 Units by mouth every morning., Disp: , Rfl:   Allergies:  Allergies  Allergen Reactions   Morphine Nausea Only    Past Medical History, Surgical history, Social history, and Family History were reviewed and updated.  Review of Systems: Review of Systems  Constitutional: Negative.   HENT:   Positive for mouth sores.   Eyes: Negative.   Respiratory: Negative.    Cardiovascular: Negative.   Gastrointestinal: Negative.   Endocrine: Negative.   Genitourinary: Negative.    Musculoskeletal:  Positive for arthralgias and back pain.  Skin: Negative.   Neurological: Negative.   Hematological: Negative.   Psychiatric/Behavioral: Negative.      Physical Exam: Temperature is 98.  Pulse 68.  Blood pressure 122/68.  Weight is 236 pounds.    Wt Readings from Last 3 Encounters:   09/05/22 237 lb (107.5 kg)  07/25/22 236 lb (107 kg)  06/13/22 237 lb (107.5 kg)    Physical Exam Vitals reviewed.  HENT:     Head: Normocephalic and atraumatic.  Eyes:     Pupils: Pupils are equal, round, and reactive to light.  Cardiovascular:     Rate and Rhythm: Normal rate and regular rhythm.     Heart sounds: Normal heart sounds.  Pulmonary:     Effort: Pulmonary effort is normal.     Breath sounds: Normal breath sounds.  Abdominal:     General: Bowel sounds are normal.     Palpations: Abdomen is soft.  Musculoskeletal:        General: No tenderness or deformity. Normal range of motion.     Cervical back: Normal range of motion.  Lymphadenopathy:     Cervical: No cervical adenopathy.  Skin:    General: Skin is warm and dry.     Findings: No erythema or rash.  Neurological:     Mental Status: He is alert and oriented to person, place, and time.  Psychiatric:        Behavior: Behavior normal.        Thought Content: Thought content normal.        Judgment: Judgment normal.      Lab Results  Component Value Date   WBC 4.9 09/05/2022   HGB 13.4 09/05/2022   HCT 39.8 09/05/2022   MCV 97.1 09/05/2022   PLT 178 09/05/2022     Chemistry      Component Value Date/Time   NA 142 07/25/2022 0757   K 4.2 07/25/2022 0757   CL 109 07/25/2022 0757   CO2 27 07/25/2022 0757   BUN 30 (H) 07/25/2022 0757   CREATININE 0.92 07/25/2022 0757      Component Value Date/Time   CALCIUM 10.7 (H) 07/25/2022 0757   ALKPHOS 100 07/25/2022 0757   AST 10 (L) 07/25/2022 0757   ALT 14 07/25/2022 0757   BILITOT 0.7 07/25/2022 0757      Impression and Plan: [name] is a very nice 72 year old white male.  He has metastatic renal cell carcinoma.  Thankfully, he is continuing to respond to immunotherapy.   From my point of view, I think everything is doing quite well.  I do not think that we have to do another scan I am probably until November or December.  We will plan to  get her back in 6 weeks.    [name], MD 8/7/20248:30 AM

## 2022-09-05 NOTE — Patient Instructions (Addendum)
[name]  Discharge Instructions: Thank you for choosing [facility] to provide your oncology and hematology care.   If you have a lab appointment with the Cancer Center, please go directly to the Cancer Center and check in at the registration area.  Wear comfortable clothing and clothing appropriate for easy access to any Portacath or PICC line.   We strive to give you quality time with your provider. You may need to reschedule your appointment if you arrive late (15 or more minutes).  Arriving late affects you and other patients whose appointments are after yours.  Also, if you miss three or more appointments without notifying the office, you may be dismissed from the clinic at the provider's discretion.      For prescription refill requests, have your pharmacy contact our office and allow 72 hours for refills to be completed.    Today you received the following chemotherapy and/or immunotherapy agents Opdivo      To help prevent nausea and vomiting after your treatment, we encourage you to take your nausea medication as directed.  BELOW ARE SYMPTOMS THAT SHOULD BE REPORTED IMMEDIATELY: *FEVER GREATER THAN 100.4 F (38 C) OR HIGHER *CHILLS OR SWEATING *NAUSEA AND VOMITING THAT IS NOT CONTROLLED WITH YOUR NAUSEA MEDICATION *UNUSUAL SHORTNESS OF BREATH *UNUSUAL BRUISING OR BLEEDING *URINARY PROBLEMS (pain or burning when urinating, or frequent urination) *BOWEL PROBLEMS (unusual diarrhea, constipation, pain near the anus) TENDERNESS IN MOUTH AND THROAT WITH OR WITHOUT PRESENCE OF ULCERS (sore throat, sores in mouth, or a toothache) UNUSUAL RASH, SWELLING OR PAIN  UNUSUAL VAGINAL DISCHARGE OR ITCHING   Items with * indicate a potential emergency and should be followed up as soon as possible or go to the Emergency Department if any problems should occur.  Please show the CHEMOTHERAPY ALERT CARD or IMMUNOTHERAPY ALERT CARD at check-in  to the Emergency Department and triage nurse. Should you have questions after your visit or need to cancel or reschedule your appointment, please contact [facility] [place]  [phone] and follow the prompts.  Office hours are 8:00 a.m. to 4:30 p.m. Monday - Friday. Please note that voicemails left after 4:00 p.m. may not be returned until the following business day.  We are closed weekends and major holidays. You have access to a nurse at all times for urgent questions. Please call the main number to the clinic [phone] and follow the prompts.  For any non-urgent questions, you may also contact your provider using MyChart. We now offer e-Visits for anyone 23 and older to request care online for non-urgent symptoms. For details visit [email or web address].   Also download the MyChart app! Go to the app store, search "MyChart", open the app, select [facility], and log in with your MyChart username and password.  [facility]  Discharge Instructions: Thank you for choosing  Cancer Center to provide your oncology and hematology care.   If you have a lab appointment with the Cancer Center, please go directly to the Cancer Center and check in at the registration area.  Wear comfortable clothing and clothing appropriate for easy access to any Portacath or PICC line.   We strive to give you quality time with your provider. You may need to reschedule your appointment if you arrive late (15 or more minutes).  Arriving late affects you and other patients whose appointments are after yours.  Also, if you  miss three or more appointments without notifying the office, you may be dismissed from the clinic at the provider's discretion.      For prescription refill requests, have your pharmacy contact our office and allow 72 hours for refills to be completed.    Today you received the following chemotherapy and/or immunotherapy  agents opdivo    To help prevent nausea and vomiting after your treatment, we encourage you to take your nausea medication as directed.  BELOW ARE SYMPTOMS THAT SHOULD BE REPORTED IMMEDIATELY: *FEVER GREATER THAN 100.4 F (38 C) OR HIGHER *CHILLS OR SWEATING *NAUSEA AND VOMITING THAT IS NOT CONTROLLED WITH YOUR NAUSEA MEDICATION *UNUSUAL SHORTNESS OF BREATH *UNUSUAL BRUISING OR BLEEDING *URINARY PROBLEMS (pain or burning when urinating, or frequent urination) *BOWEL PROBLEMS (unusual diarrhea, constipation, pain near the anus) TENDERNESS IN MOUTH AND THROAT WITH OR WITHOUT PRESENCE OF ULCERS (sore throat, sores in mouth, or a toothache) UNUSUAL RASH, SWELLING OR PAIN  UNUSUAL VAGINAL DISCHARGE OR ITCHING   Items with * indicate a potential emergency and should be followed up as soon as possible or go to the Emergency Department if any problems should occur.  Please show the CHEMOTHERAPY ALERT CARD or IMMUNOTHERAPY ALERT CARD at check-in to the Emergency Department and triage nurse. Should you have questions after your visit or need to cancel or reschedule your appointment, please contact [facility] [place]  [phone] and follow the prompts.  Office hours are 8:00 a.m. to 4:30 p.m. Monday - Friday. Please note that voicemails left after 4:00 p.m. may not be returned until the following business day.  We are closed weekends and major holidays. You have access to a nurse at all times for urgent questions. Please call the main number to the clinic [phone] and follow the prompts.  For any non-urgent questions, you may also contact your provider using MyChart. We now offer e-Visits for anyone 42 and older to request care online for non-urgent symptoms. For details visit [email or web address].   Also download the MyChart app! Go to the app store, search "MyChart", open the app, select [facility], and log in with your MyChart username and password.

## 2022-09-05 NOTE — Progress Notes (Signed)
Patient requested that I fax his PET scan from 08/30/2022 to Lockie Pares, who works with Dr.Roy Goodwin(neurosurgeon at [facility] in [place]). Fax # is [phone]. Office phone # is [phone]. Received faxed confirmation. Patient is aware that the fax went through.

## 2022-09-07 ENCOUNTER — Other Ambulatory Visit: Payer: Self-pay

## 2022-09-22 ENCOUNTER — Encounter: Payer: Self-pay | Admitting: Hematology & Oncology

## 2022-10-15 ENCOUNTER — Encounter: Payer: Self-pay | Admitting: Hematology & Oncology

## 2022-10-17 ENCOUNTER — Inpatient Hospital Stay: Payer: Medicare Other

## 2022-10-17 ENCOUNTER — Inpatient Hospital Stay: Payer: Medicare Other | Attending: Hematology & Oncology

## 2022-10-17 ENCOUNTER — Encounter: Payer: Self-pay | Admitting: Family

## 2022-10-17 ENCOUNTER — Inpatient Hospital Stay (HOSPITAL_BASED_OUTPATIENT_CLINIC_OR_DEPARTMENT_OTHER): Payer: Medicare Other | Admitting: Family

## 2022-10-17 VITALS — BP 95/59

## 2022-10-17 VITALS — BP 89/65 | HR 76 | Temp 98.0°F | Resp 18 | Wt 232.1 lb

## 2022-10-17 DIAGNOSIS — D509 Iron deficiency anemia, unspecified: Secondary | ICD-10-CM

## 2022-10-17 DIAGNOSIS — R5383 Other fatigue: Secondary | ICD-10-CM | POA: Insufficient documentation

## 2022-10-17 DIAGNOSIS — Z7952 Long term (current) use of systemic steroids: Secondary | ICD-10-CM | POA: Insufficient documentation

## 2022-10-17 DIAGNOSIS — M7989 Other specified soft tissue disorders: Secondary | ICD-10-CM | POA: Insufficient documentation

## 2022-10-17 DIAGNOSIS — C641 Malignant neoplasm of right kidney, except renal pelvis: Secondary | ICD-10-CM

## 2022-10-17 DIAGNOSIS — R634 Abnormal weight loss: Secondary | ICD-10-CM | POA: Diagnosis not present

## 2022-10-17 DIAGNOSIS — Z5112 Encounter for antineoplastic immunotherapy: Secondary | ICD-10-CM | POA: Insufficient documentation

## 2022-10-17 DIAGNOSIS — C7951 Secondary malignant neoplasm of bone: Secondary | ICD-10-CM | POA: Insufficient documentation

## 2022-10-17 LAB — CMP (CANCER CENTER ONLY)
ALT: 10 U/L (ref 0–44)
AST: 9 U/L — ABNORMAL LOW (ref 15–41)
Albumin: 3.6 g/dL (ref 3.5–5.0)
Alkaline Phosphatase: 103 U/L (ref 38–126)
Anion gap: 6 (ref 5–15)
BUN: 22 mg/dL (ref 8–23)
CO2: 25 mmol/L (ref 22–32)
Calcium: 10.2 mg/dL (ref 8.9–10.3)
Chloride: 110 mmol/L (ref 98–111)
Creatinine: 0.87 mg/dL (ref 0.61–1.24)
GFR, Estimated: >60 mL/min (ref >60)
Glucose, Bld: 85 mg/dL (ref 70–99)
Potassium: 3.7 mmol/L (ref 3.5–5.1)
Sodium: 141 mmol/L (ref 135–145)
Total Bilirubin: 0.6 mg/dL (ref 0.3–1.2)
Total Protein: 5.7 g/dL — ABNORMAL LOW (ref 6.5–8.1)

## 2022-10-17 LAB — CBC WITH DIFFERENTIAL (CANCER CENTER ONLY)
Abs Immature Granulocytes: 0 10*3/uL (ref 0.00–0.07)
Basophils Absolute: 0 10*3/uL (ref 0.0–0.1)
Basophils Relative: 1 %
Eosinophils Absolute: 0.2 10*3/uL (ref 0.0–0.5)
Eosinophils Relative: 3 %
HCT: 37.9 % — ABNORMAL LOW (ref 39.0–52.0)
Hemoglobin: 12.8 g/dL — ABNORMAL LOW (ref 13.0–17.0)
Immature Granulocytes: 0 %
Lymphocytes Relative: 27 %
Lymphs Abs: 1.3 10*3/uL (ref 0.7–4.0)
MCH: 32.1 pg (ref 26.0–34.0)
MCHC: 33.8 g/dL (ref 30.0–36.0)
MCV: 95 fL (ref 80.0–100.0)
Monocytes Absolute: 0.7 10*3/uL (ref 0.1–1.0)
Monocytes Relative: 15 %
Neutro Abs: 2.6 10*3/uL (ref 1.7–7.7)
Neutrophils Relative %: 54 %
Platelet Count: 177 10*3/uL (ref 150–400)
RBC: 3.99 MIL/uL — ABNORMAL LOW (ref 4.22–5.81)
RDW: 12.1 % (ref 11.5–15.5)
WBC Count: 4.8 10*3/uL (ref 4.0–10.5)
nRBC: 0 % (ref 0.0–0.2)

## 2022-10-17 LAB — FERRITIN: Ferritin: 78 ng/mL (ref 24–336)

## 2022-10-17 LAB — IRON AND IRON BINDING CAPACITY (CC-WL,HP ONLY)
Iron: 74 ug/dL (ref 45–182)
Saturation Ratios: 24 % (ref 17.9–39.5)
TIBC: 311 ug/dL (ref 250–450)
UIBC: 237 ug/dL (ref 117–376)

## 2022-10-17 LAB — LACTATE DEHYDROGENASE: LDH: 130 U/L (ref 98–192)

## 2022-10-17 MED ORDER — SODIUM CHLORIDE 0.9 % IV SOLN: Freq: Once | INTRAVENOUS | Status: AC

## 2022-10-17 MED ORDER — SODIUM CHLORIDE 0.9 % IV SOLN
480.0000 mg | Freq: Once | INTRAVENOUS | Status: AC
  Administered 2022-10-17: 480 mg via INTRAVENOUS
  Filled 2022-10-17: qty 48

## 2022-10-17 NOTE — Progress Notes (Signed)
Hematology and Oncology Follow Up Visit  Francis Cunningham [id number] May 06, 1950 72 y.o. 10/17/2022   Principle Diagnosis:  Metastatic renal cell carcinoma-bone only metastasis   Current Therapy:        Maintenance nivolumab-Q 6-week dosing -- s/p cycle #24 --changed to 6-week dosing on 04/03/2022   Interim History:  [name] is here today for follow-up and treatment. He is doing well but has noted some fatigue and weakness as well as decreased appetite over the last month.  He is tapering down on his prednisone and is currently on 1/4 tablet once a day.  He had a fall 2 weeks ago while [place] the golf course. He was chasing a renegade golf ball ad took a tumble. Thankfully he went to the ED and states that his xrays did not show any breaks.  His BP is down at 89/65 and he is currently taking Midodrine 2.5 mg PO TID for orthostatic hypotension. I spoke with Dr. Myna Hidalgo about his current issues and no changes to his medication indicated at this time per MD.  His weight is down 5 lbs since his last visit which he is happy about.  He is still eating and hydrating throughout the day.  No numbness or tingling in his extremities.  He has chronic swelling in his lower extremities more so in the right. Pedal pulses are 2 +.  He did have some bruising in both knees with his recent fall that has resolved.  No other bruising, no blood loss or petechiae noted.   ECOG Performance Status: 1 - Symptomatic but completely ambulatory  Medications:  Allergies as of 10/17/2022       Reactions   Morphine Nausea Only        Medication List        Accurate as of October 17, 2022  8:41 AM. If you have any questions, ask your nurse or doctor.          acetaminophen 325 MG tablet Commonly known as: TYLENOL Take 325 mg by mouth every 6 (six) hours as needed.   alfuzosin 10 MG 24 hr tablet Commonly known as: UROXATRAL Take 10 mg by mouth at bedtime.   ascorbic acid 500 MG tablet Commonly  known as: VITAMIN C Take 500 mg by mouth in the morning.   Cholecalciferol 25 MCG (1000 UT) tablet Take 2,000 Units by mouth daily.   escitalopram 20 MG tablet Commonly known as: LEXAPRO Take 20 mg by mouth daily.   finasteride 5 MG tablet Commonly known as: PROSCAR Take 5 mg by mouth daily.   FISH OIL PO Take 5 mLs by mouth daily.   guaiFENesin 600 MG 12 hr tablet Commonly known as: MUCINEX Take 600 mg by mouth in the morning and at bedtime.   LORazepam 1 MG tablet Commonly known as: ATIVAN Take 2 mg by mouth as needed. Ona tablest as needed for procedures and imaging   Magnesium Carbonate Powd Take 325 mg by mouth daily. 1 1/2  teaspoons daily.   melatonin 3 MG Tabs tablet Take 3 mg by mouth at bedtime.   midodrine 2.5 MG tablet Commonly known as: PROAMATINE TAKE ONE (1) TABLET BY MOUTH 3 TIMES DAILY   montelukast 10 MG tablet Commonly known as: SINGULAIR Take 1 tablet by mouth daily.   Multi-Vitamins Tabs Take 1 tablet by mouth daily.   nivolumab 100 MG/10ML Soln chemo injection Commonly known as: OPDIVO Inject into the vein.   polyethylene glycol powder 17 GM/SCOOP powder  Commonly known as: GLYCOLAX/MIRALAX Take 17 g by mouth at bedtime.   Potassium Citrate-Citric Acid 3300-1002 MG Pack Take by mouth daily.   predniSONE 10 MG tablet Commonly known as: DELTASONE Take 0.5 tablets (5 mg total) by mouth daily.   PROBIOTIC ACIDOPHILUS BEADS PO Take by mouth daily.   Sinus Rinse Pack Irrigate with as directed.   vitamin E 180 MG (400 UNITS) capsule Take 400 Units by mouth every morning.        Allergies:  Allergies  Allergen Reactions   Morphine Nausea Only    Past Medical History, Surgical history, Social history, and Family History were reviewed and updated.  Review of Systems: All other 10 point review of systems is negative.   Physical Exam:  weight is 232 lb 1.3 oz (105.3 kg). His oral temperature is 98 F (36.7 C). His blood  pressure is 89/65 (abnormal) and his pulse is 76. His respiration is 18 and oxygen saturation is 98%.   Wt Readings from Last 3 Encounters:  10/17/22 232 lb 1.3 oz (105.3 kg)  09/05/22 237 lb (107.5 kg)  07/25/22 236 lb (107 kg)    Ocular: Sclerae unicteric, pupils equal, round and reactive to light Ear-nose-throat: Oropharynx clear, dentition fair Lymphatic: No cervical or supraclavicular adenopathy Lungs no rales or rhonchi, good excursion bilaterally Heart regular rate and rhythm, no murmur appreciated Abd soft, nontender, positive bowel sounds MSK no focal spinal tenderness, no joint edema Neuro: non-focal, well-oriented, appropriate affect Breasts: Deferred   Lab Results  Component Value Date   WBC 4.8 10/17/2022   HGB 12.8 (L) 10/17/2022   HCT 37.9 (L) 10/17/2022   MCV 95.0 10/17/2022   PLT 177 10/17/2022   No results found for: "FERRITIN", "IRON", "TIBC", "UIBC", "IRONPCTSAT" Lab Results  Component Value Date   RBC 3.99 (L) 10/17/2022   No results found for: "KPAFRELGTCHN", "LAMBDASER", "KAPLAMBRATIO" No results found for: "IGGSERUM", "IGA", "IGMSERUM" No results found for: "TOTALPROTELP", "ALBUMINELP", "A1GS", "A2GS", "BETS", "BETA2SER", "GAMS", "MSPIKE", "SPEI"   Chemistry      Component Value Date/Time   NA 142 09/05/2022 0759   K 4.0 09/05/2022 0759   CL 108 09/05/2022 0759   CO2 27 09/05/2022 0759   BUN 29 (H) 09/05/2022 0759   CREATININE 0.95 09/05/2022 0759      Component Value Date/Time   CALCIUM 10.2 09/05/2022 0759   ALKPHOS 103 09/05/2022 0759   AST 9 (L) 09/05/2022 0759   ALT 14 09/05/2022 0759   BILITOT 0.4 09/05/2022 0759       Impression and Plan: [name] is a very pleasant 72 caucasian gentleman with metastatic renal cell carcinoma.  We will proceed with treatment today as planned.  We will plan to repeat a PET scan in November or December.  Follow-up with MD in 6 weeks.   Eileen Stanford, NP 9/18/20248:41 AM

## 2022-10-17 NOTE — Patient Instructions (Signed)
[facility]  Discharge Instructions: Thank you for choosing [facility] to provide your oncology and hematology care.   If you have a lab appointment with the Cancer Center, please go directly to the Cancer Center and check in at the registration area.  Wear comfortable clothing and clothing appropriate for easy access to any Portacath or PICC line.   We strive to give you quality time with your provider. You may need to reschedule your appointment if you arrive late (15 or more minutes).  Arriving late affects you and other patients whose appointments are after yours.  Also, if you miss three or more appointments without notifying the office, you may be dismissed from the clinic at the provider's discretion.      For prescription refill requests, have your pharmacy contact our office and allow 72 hours for refills to be completed.    Today you received the following chemotherapy and/or immunotherapy agents opdivo       To help prevent nausea and vomiting after your treatment, we encourage you to take your nausea medication as directed.  BELOW ARE SYMPTOMS THAT SHOULD BE REPORTED IMMEDIATELY: *FEVER GREATER THAN 100.4 F (38 C) OR HIGHER *CHILLS OR SWEATING *NAUSEA AND VOMITING THAT IS NOT CONTROLLED WITH YOUR NAUSEA MEDICATION *UNUSUAL SHORTNESS OF BREATH *UNUSUAL BRUISING OR BLEEDING *URINARY PROBLEMS (pain or burning when urinating, or frequent urination) *BOWEL PROBLEMS (unusual diarrhea, constipation, pain near the anus) TENDERNESS IN MOUTH AND THROAT WITH OR WITHOUT PRESENCE OF ULCERS (sore throat, sores in mouth, or a toothache) UNUSUAL RASH, SWELLING OR PAIN  UNUSUAL VAGINAL DISCHARGE OR ITCHING   Items with * indicate a potential emergency and should be followed up as soon as possible or go to the Emergency Department if any problems should occur.  Please show the CHEMOTHERAPY ALERT CARD or IMMUNOTHERAPY ALERT CARD at check-in  to the Emergency Department and triage nurse. Should you have questions after your visit or need to cancel or reschedule your appointment, please contact [facility]  [phone] and follow the prompts.  Office hours are 8:00 a.m. to 4:30 p.m. Monday - Friday. Please note that voicemails left after 4:00 p.m. may not be returned until the following business day.  We are closed weekends and major holidays. You have access to a nurse at all times for urgent questions. Please call the main number to the clinic [phone] and follow the prompts.  For any non-urgent questions, you may also contact your provider using MyChart. We now offer e-Visits for anyone 38 and older to request care online for non-urgent symptoms. For details visit [email or web address].   Also download the MyChart app! Go to the app store, search "MyChart", open the app, select [facility], and log in with your MyChart username and password.

## 2022-10-23 ENCOUNTER — Other Ambulatory Visit: Payer: Self-pay

## 2022-11-01 ENCOUNTER — Other Ambulatory Visit (HOSPITAL_BASED_OUTPATIENT_CLINIC_OR_DEPARTMENT_OTHER): Payer: Self-pay

## 2022-11-01 MED ORDER — COVID-19 MRNA VAC-TRIS(PFIZER) 30 MCG/0.3ML IM SUSY
0.3000 mL | PREFILLED_SYRINGE | Freq: Once | INTRAMUSCULAR | 0 refills | Status: AC
  Filled 2022-11-01: qty 0.3, 1d supply, fill #0

## 2022-11-16 ENCOUNTER — Other Ambulatory Visit: Payer: Self-pay

## 2022-11-22 ENCOUNTER — Encounter: Payer: Self-pay | Admitting: Hematology & Oncology

## 2022-11-28 ENCOUNTER — Encounter: Payer: Self-pay | Admitting: Hematology & Oncology

## 2022-11-28 ENCOUNTER — Inpatient Hospital Stay (HOSPITAL_BASED_OUTPATIENT_CLINIC_OR_DEPARTMENT_OTHER): Payer: Medicare Other | Admitting: Hematology & Oncology

## 2022-11-28 ENCOUNTER — Other Ambulatory Visit: Payer: Self-pay

## 2022-11-28 ENCOUNTER — Inpatient Hospital Stay: Payer: Medicare Other

## 2022-11-28 ENCOUNTER — Inpatient Hospital Stay: Payer: Medicare Other | Attending: Hematology & Oncology

## 2022-11-28 VITALS — BP 107/51 | HR 61 | Resp 18

## 2022-11-28 DIAGNOSIS — F410 Panic disorder [episodic paroxysmal anxiety] without agoraphobia: Secondary | ICD-10-CM | POA: Insufficient documentation

## 2022-11-28 DIAGNOSIS — C7951 Secondary malignant neoplasm of bone: Secondary | ICD-10-CM | POA: Diagnosis present

## 2022-11-28 DIAGNOSIS — Z5112 Encounter for antineoplastic immunotherapy: Secondary | ICD-10-CM | POA: Insufficient documentation

## 2022-11-28 DIAGNOSIS — M1990 Unspecified osteoarthritis, unspecified site: Secondary | ICD-10-CM | POA: Diagnosis not present

## 2022-11-28 DIAGNOSIS — M7989 Other specified soft tissue disorders: Secondary | ICD-10-CM | POA: Insufficient documentation

## 2022-11-28 DIAGNOSIS — R634 Abnormal weight loss: Secondary | ICD-10-CM | POA: Insufficient documentation

## 2022-11-28 DIAGNOSIS — Z79620 Long term (current) use of immunosuppressive biologic: Secondary | ICD-10-CM | POA: Diagnosis not present

## 2022-11-28 DIAGNOSIS — C641 Malignant neoplasm of right kidney, except renal pelvis: Secondary | ICD-10-CM | POA: Insufficient documentation

## 2022-11-28 DIAGNOSIS — Z7952 Long term (current) use of systemic steroids: Secondary | ICD-10-CM | POA: Insufficient documentation

## 2022-11-28 LAB — CMP (CANCER CENTER ONLY)
ALT: 10 U/L (ref 0–44)
AST: 10 U/L — ABNORMAL LOW (ref 15–41)
Albumin: 3.9 g/dL (ref 3.5–5.0)
Alkaline Phosphatase: 101 U/L (ref 38–126)
Anion gap: 7 (ref 5–15)
BUN: 19 mg/dL (ref 8–23)
CO2: 26 mmol/L (ref 22–32)
Calcium: 10.3 mg/dL (ref 8.9–10.3)
Chloride: 109 mmol/L (ref 98–111)
Creatinine: 0.94 mg/dL (ref 0.61–1.24)
GFR, Estimated: >60 mL/min (ref >60)
Glucose, Bld: 100 mg/dL — ABNORMAL HIGH (ref 70–99)
Potassium: 3.9 mmol/L (ref 3.5–5.1)
Sodium: 142 mmol/L (ref 135–145)
Total Bilirubin: 0.5 mg/dL (ref 0.3–1.2)
Total Protein: 5.9 g/dL — ABNORMAL LOW (ref 6.5–8.1)

## 2022-11-28 LAB — CBC WITH DIFFERENTIAL (CANCER CENTER ONLY)
Abs Immature Granulocytes: 0.01 10*3/uL (ref 0.00–0.07)
Basophils Absolute: 0 10*3/uL (ref 0.0–0.1)
Basophils Relative: 1 %
Eosinophils Absolute: 0.1 10*3/uL (ref 0.0–0.5)
Eosinophils Relative: 3 %
HCT: 38.5 % — ABNORMAL LOW (ref 39.0–52.0)
Hemoglobin: 13.1 g/dL (ref 13.0–17.0)
Immature Granulocytes: 0 %
Lymphocytes Relative: 29 %
Lymphs Abs: 1.5 10*3/uL (ref 0.7–4.0)
MCH: 31.9 pg (ref 26.0–34.0)
MCHC: 34 g/dL (ref 30.0–36.0)
MCV: 93.7 fL (ref 80.0–100.0)
Monocytes Absolute: 0.6 10*3/uL (ref 0.1–1.0)
Monocytes Relative: 12 %
Neutro Abs: 2.9 10*3/uL (ref 1.7–7.7)
Neutrophils Relative %: 55 %
Platelet Count: 165 10*3/uL (ref 150–400)
RBC: 4.11 MIL/uL — ABNORMAL LOW (ref 4.22–5.81)
RDW: 12.3 % (ref 11.5–15.5)
WBC Count: 5.2 10*3/uL (ref 4.0–10.5)
nRBC: 0 % (ref 0.0–0.2)

## 2022-11-28 LAB — LACTATE DEHYDROGENASE: LDH: 125 U/L (ref 98–192)

## 2022-11-28 LAB — TSH: TSH: 2.601 u[IU]/mL (ref 0.350–4.500)

## 2022-11-28 MED ORDER — DEXAMETHASONE SODIUM PHOSPHATE 10 MG/ML IJ SOLN: 10.0000 mg | Freq: Once | INTRAMUSCULAR | Status: DC

## 2022-11-28 MED ORDER — SODIUM CHLORIDE 0.9 % IV SOLN
480.0000 mg | Freq: Once | INTRAVENOUS | Status: AC
  Administered 2022-11-28: 480 mg via INTRAVENOUS
  Filled 2022-11-28: qty 48

## 2022-11-28 MED ORDER — SODIUM CHLORIDE 0.9 % IV SOLN: 10.0000 mg | Freq: Once | INTRAVENOUS | Status: DC

## 2022-11-28 MED ORDER — SODIUM CHLORIDE 0.9 % IV SOLN: Freq: Once | INTRAVENOUS | Status: AC

## 2022-11-28 MED ORDER — DEXAMETHASONE SODIUM PHOSPHATE 100 MG/10ML IJ SOLN: 10.0000 mg | Freq: Once | INTRAMUSCULAR | Status: DC

## 2022-11-28 MED ORDER — DEXAMETHASONE SODIUM PHOSPHATE 10 MG/ML IJ SOLN
10.0000 mg | Freq: Once | INTRAMUSCULAR | Status: AC
  Administered 2022-11-28: 10 mg via INTRAVENOUS

## 2022-11-28 NOTE — Progress Notes (Signed)
Hematology and Oncology Follow Up Visit  Straton Rumple [id number] Oct 28, 1950 72 y.o. 11/28/2022   Principle Diagnosis:  Metastatic renal cell carcinoma-bone only metastasis  Current Therapy:   Maintenance nivolumab-Q 6-week dosing -- s/p cycle #24 --changed to 6-week dosing on 04/03/2022     Interim History:  Mr. Roney Marion III is back for follow-up.  Unfortunately, he is having quite a bit of problems.  I am not sure exactly what is related to what he has with respect to his kidney cancer.  He does have problems with arthritis.  He does not want to see the orthopedist back in [place].  We will set him up with Dr. Jerl Santos of [facility].  He is also having some problems with panic attacks.  He is having sleep issues.  He is lost weight.  His appetite is down.  We will check his TSH and see how that looks.  I think part of the problem is that he has tapered down on the prednisone.  Maybe, he needs to be on 10 mg daily.  I told him to go back up to 10 mg.  This may make him feel better.  He has had no problems with diarrhea.  He has had no urinary issues.  There is been no cough.  He has had no fever.  He has had no rashes.  There has been no bleeding.Marland Kitchen  He does have a little bit of leg swelling.  This has been chronic.  Overall, I would say his performance status is probably ECOG 1-2.   Medications:  Current Outpatient Medications:    acetaminophen (TYLENOL) 325 MG tablet, Take 325 mg by mouth every 6 (six) hours as needed., Disp: , Rfl:    alfuzosin (UROXATRAL) 10 MG 24 hr tablet, Take 10 mg by mouth at bedtime., Disp: , Rfl:    ascorbic acid (VITAMIN C) 500 MG tablet, Take 500 mg by mouth in the morning., Disp: , Rfl:    Cholecalciferol 25 MCG (1000 UT) tablet, Take 2,000 Units by mouth daily., Disp: , Rfl:    escitalopram (LEXAPRO) 20 MG tablet, Take 20 mg by mouth daily., Disp: , Rfl:    finasteride (PROSCAR) 5 MG tablet, Take 5 mg by mouth daily., Disp: , Rfl:     guaiFENesin (MUCINEX) 600 MG 12 hr tablet, Take 600 mg by mouth in the morning and at bedtime., Disp: , Rfl:    Hypertonic Nasal Wash (SINUS RINSE) PACK, Irrigate with as directed., Disp: , Rfl:    LORazepam (ATIVAN) 1 MG tablet, Take 2 mg by mouth as needed. Ona tablest as needed for procedures and imaging, Disp: , Rfl:    Magnesium Carbonate POWD, Take 325 mg by mouth daily. 1 1/2  teaspoons daily., Disp: , Rfl:    melatonin 3 MG TABS tablet, Take 3 mg by mouth at bedtime., Disp: , Rfl:    midodrine (PROAMATINE) 2.5 MG tablet, TAKE ONE (1) TABLET BY MOUTH 3 TIMES DAILY, Disp: 90 tablet, Rfl: 8   montelukast (SINGULAIR) 10 MG tablet, Take 1 tablet by mouth daily., Disp: , Rfl:    Multiple Vitamin (MULTI-VITAMINS) TABS, Take 1 tablet by mouth daily., Disp: , Rfl:    nivolumab (OPDIVO) 100 MG/10ML SOLN chemo injection, Inject into the vein., Disp: , Rfl:    Omega-3 Fatty Acids (FISH OIL PO), Take 5 mLs by mouth daily., Disp: , Rfl:    polyethylene glycol powder (GLYCOLAX/MIRALAX) 17 GM/SCOOP powder, Take 17 g by mouth at bedtime.,  Disp: , Rfl:    Potassium Citrate-Citric Acid 3300-1002 MG PACK, Take by mouth daily., Disp: , Rfl:    predniSONE (DELTASONE) 10 MG tablet, Take 0.5 tablets (5 mg total) by mouth daily., Disp: 90 tablet, Rfl: 6   Probiotic Product (PROBIOTIC ACIDOPHILUS BEADS PO), Take by mouth daily., Disp: , Rfl:    vitamin E 180 MG (400 UNITS) capsule, Take 400 Units by mouth every morning., Disp: , Rfl:   Allergies:  Allergies  Allergen Reactions   Morphine Nausea Only    Past Medical History, Surgical history, Social history, and Family History were reviewed and updated.  Review of Systems: Review of Systems  Constitutional: Negative.   HENT:   Positive for mouth sores.   Eyes: Negative.   Respiratory: Negative.    Cardiovascular: Negative.   Gastrointestinal: Negative.   Endocrine: Negative.   Genitourinary: Negative.    Musculoskeletal:  Positive for arthralgias and  back pain.  Skin: Negative.   Neurological: Negative.   Hematological: Negative.   Psychiatric/Behavioral: Negative.      Physical Exam: Temperature is 97.8.  Pulse 78.  Blood pressure 92/46.  Weight is 222 pounds.     Wt Readings from Last 3 Encounters:  11/28/22 222 lb (100.7 kg)  10/17/22 232 lb 1.3 oz (105.3 kg)  09/05/22 237 lb (107.5 kg)    Physical Exam Vitals reviewed.  HENT:     Head: Normocephalic and atraumatic.  Eyes:     Pupils: Pupils are equal, round, and reactive to light.  Cardiovascular:     Rate and Rhythm: Normal rate and regular rhythm.     Heart sounds: Normal heart sounds.  Pulmonary:     Effort: Pulmonary effort is normal.     Breath sounds: Normal breath sounds.  Abdominal:     General: Bowel sounds are normal.     Palpations: Abdomen is soft.  Musculoskeletal:        General: No tenderness or deformity. Normal range of motion.     Cervical back: Normal range of motion.  Lymphadenopathy:     Cervical: No cervical adenopathy.  Skin:    General: Skin is warm and dry.     Findings: No erythema or rash.  Neurological:     Mental Status: He is alert and oriented to person, place, and time.  Psychiatric:        Behavior: Behavior normal.        Thought Content: Thought content normal.        Judgment: Judgment normal.      Lab Results  Component Value Date   WBC 5.2 11/28/2022   HGB 13.1 11/28/2022   HCT 38.5 (L) 11/28/2022   MCV 93.7 11/28/2022   PLT 165 11/28/2022     Chemistry      Component Value Date/Time   NA 142 11/28/2022 0830   K 3.9 11/28/2022 0830   CL 109 11/28/2022 0830   CO2 26 11/28/2022 0830   BUN 19 11/28/2022 0830   CREATININE 0.94 11/28/2022 0830      Component Value Date/Time   CALCIUM 10.3 11/28/2022 0830   ALKPHOS 101 11/28/2022 0830   AST 10 (L) 11/28/2022 0830   ALT 10 11/28/2022 0830   BILITOT 0.5 11/28/2022 0830      Impression and Plan: Mr. Roney Marion III is a very nice 72 year old white male.  He  has metastatic renal cell carcinoma.  We will have to go ahead and get him set up with a PET scan.  I am not sure that what is complaining of is secondary to his metastatic kidney cancer.  However, it certainly could be.  Again we will have to check his thyroid.  He does not appear to be hypothyroid.  I would think that were the case he would be gaining weight and not losing weight.  His chemistries do not like all that bad.  Again, maybe just increase in the prednisone again will help him.  We will still follow him up in 6 weeks.  Will get the PET scan in about 4 weeks.    [name], MD 10/30/20249:30 AM

## 2022-11-28 NOTE — Patient Instructions (Signed)
Nivolumab Injection What is this medication? NIVOLUMAB (nye VOL ue mab) treats some types of cancer. It works by helping your immune system slow or stop the spread of cancer cells. It is a monoclonal antibody. This medicine may be used for other purposes; ask your health care provider or pharmacist if you have questions. COMMON BRAND NAME(S): Opdivo What should I tell my care team before I take this medication? They need to know if you have any of these conditions: Allogeneic stem cell transplant (uses someone else's stem cells) Autoimmune diseases, such as Crohn disease, ulcerative colitis, lupus History of chest radiation Nervous system problems, such as Guillain-Barre syndrome or myasthenia gravis Organ transplant An unusual or allergic reaction to nivolumab, other medications, foods, dyes, or preservatives Pregnant or trying to get pregnant Breast-feeding How should I use this medication? This medication is infused into a vein. It is given in a hospital or clinic setting. A special MedGuide will be given to you before each treatment. Be sure to read this information carefully each time. Talk to your care team about the use of this medication in children. While it may be prescribed for children as young as 12 years for selected conditions, precautions do apply. Overdosage: If you think you have taken too much of this medicine contact a poison control center or emergency room at once. NOTE: This medicine is only for you. Do not share this medicine with others. What if I miss a dose? Keep appointments for follow-up doses. It is important not to miss your dose. Call your care team if you are unable to keep an appointment. What may interact with this medication? Interactions have not been studied. This list may not describe all possible interactions. Give your health care provider a list of all the medicines, herbs, non-prescription drugs, or dietary supplements you use. Also tell them if you  smoke, drink alcohol, or use illegal drugs. Some items may interact with your medicine. What should I watch for while using this medication? Your condition will be monitored carefully while you are receiving this medication. You may need blood work while taking this medication. This medication may cause serious skin reactions. They can happen weeks to months after starting the medication. Contact your care team right away if you notice fevers or flu-like symptoms with a rash. The rash may be red or purple and then turn into blisters or peeling of the skin. You may also notice a red rash with swelling of the face, lips, or lymph nodes in your neck or under your arms. Tell your care team right away if you have any change in your eyesight. Talk to your care team if you are pregnant or think you might be pregnant. A negative pregnancy test is required before starting this medication. A reliable form of contraception is recommended while taking this medication and for 5 months after the last dose. Talk to your care team about effective forms of contraception. Do not breast-feed while taking this medication and for 5 months after the last dose. What side effects may I notice from receiving this medication? Side effects that you should report to your care team as soon as possible: Allergic reactions--skin rash, itching, hives, swelling of the face, lips, tongue, or throat Dry cough, shortness of breath or trouble breathing Eye pain, redness, irritation, or discharge with blurry or decreased vision Heart muscle inflammation--unusual weakness or fatigue, shortness of breath, chest pain, fast or irregular heartbeat, dizziness, swelling of the ankles, feet, or hands Hormone   gland problems--headache, sensitivity to light, unusual weakness or fatigue, dizziness, fast or irregular heartbeat, increased sensitivity to cold or heat, excessive sweating, constipation, hair loss, increased thirst or amount of urine,  tremors or shaking, irritability Infusion reactions--chest pain, shortness of breath or trouble breathing, feeling faint or lightheaded Kidney injury (glomerulonephritis)--decrease in the amount of urine, red or dark brown urine, foamy or bubbly urine, swelling of the ankles, hands, or feet Liver injury--right upper belly pain, loss of appetite, nausea, light-colored stool, dark yellow or brown urine, yellowing skin or eyes, unusual weakness or fatigue Pain, tingling, or numbness in the hands or feet, muscle weakness, change in vision, confusion or trouble speaking, loss of balance or coordination, trouble walking, seizures Rash, fever, and swollen lymph nodes Redness, blistering, peeling, or loosening of the skin, including inside the mouth Sudden or severe stomach pain, bloody diarrhea, fever, nausea, vomiting Side effects that usually do not require medical attention (report these to your care team if they continue or are bothersome): Bone, joint, or muscle pain Diarrhea Fatigue Loss of appetite Nausea Skin rash This list may not describe all possible side effects. Call your doctor for medical advice about side effects. You may report side effects to FDA at 1-800-FDA-1088. Where should I keep my medication? This medication is given in a hospital or clinic. It will not be stored at home. NOTE: This sheet is a summary. It may not cover all possible information. If you have questions about this medicine, talk to your doctor, pharmacist, or health care provider.  2024 Elsevier/Gold Standard (2021-05-15 00:00:00)  

## 2022-11-28 NOTE — Addendum Note (Signed)
Addended by: Mardi Mainland on: 11/28/2022 09:43 AM   Modules accepted: Orders

## 2022-11-29 ENCOUNTER — Other Ambulatory Visit: Payer: Self-pay

## 2022-11-30 ENCOUNTER — Other Ambulatory Visit: Payer: Self-pay

## 2022-12-06 ENCOUNTER — Telehealth: Payer: Self-pay | Admitting: Dietician

## 2022-12-06 NOTE — Telephone Encounter (Signed)
Patient screened on MST. First attempt to reach. Provided my cell# on voice mail to return call to set up a nutrition consult. ° °Cyndi Shaquita Fort, RDN, LDN °Registered Dietitian, [facility] °Part Time Remote (Usual office hours: Tuesday-Thursday) °Cell: [phone]   °

## 2022-12-07 ENCOUNTER — Encounter: Payer: Self-pay | Admitting: Hematology & Oncology

## 2022-12-11 ENCOUNTER — Inpatient Hospital Stay: Payer: Medicare Other | Attending: Hematology & Oncology | Admitting: Dietician

## 2022-12-11 NOTE — Progress Notes (Signed)
Nutrition Assessment Reached out to patient and spouse on patient's mobile #.  Reason for Assessment: MST screen for weight loss.    ASSESSMENT: Patient is a 72 year old male with Metastatic renal cell carcinoma-bone only metastasis.  He is followed at [facility] by Dr. Myna Hidalgo and also by team at [facility].  He was recently taped on his prednisone which resulted in nausea, anorexia, sleep issues, and panic attacks.  When he returned to previous dosing after last  immunotherapy infusion he felt better immediately.  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  Wanted to lose some weight(5# deliberately) but last 6 weeks he lost 115#.   He reports he likes the CBW and would like to lose 10 more # eventually. They live at [facility], food is excellent.  He has access to a wonderful gym there and he has been riding the recumbent bike.  He has no food allergies but  very limited variety of foods he enjoys.  No milk, no cottage cheese, no yogurt, no tomatoes, no onion,  just started eating celery.    3 meal a day pattern. Breakfast Premier protein drink, PBJ sandwich sometimes. (takes nap)  Lunch: Soup , [place] sandwich  wheat bread no veggies Dinner: [employer] spring mix, craisins, shredded cheese, Mandarin oranges, pear, sunflower seeds. Couple of cookies, dry peanuts. Fluid intake: water and Couple glasses of decaf iced and tea (32oz) with Sweet & low, overall 90-100oz.   Medications include: Miralax, Ca+ Mg, MVI, Vit. D, probiotic.   Anthropometrics: 15# (6.3%) past 2 months  Height: 74" Weight: 222# UBW: 240# BMI: 28.5    NUTRITION DIAGNOSIS: Food and Nutrition Related Knowledge Deficit related to cancer and associated treatments as evidenced by no prior need for nutrition related information.   INTERVENTION:  Relayed that nutrition services are wrap around service provided at no charge and encouraged continued  communication if experiencing continued weight loss or any nutritional impact symptoms (NIS). Educated on importance of adequate calorie and protein energy intake  with nutrient dense foods when possible to maintain weight/strength encouraged protein pacing especially with entree salads. Emailed Nutrition Tip sheet  for  Nutrition for Cancer Survivors and plant based protein sources with contact information provided   MONITORING, EVALUATION, GOAL: weight, PO intake, Nutrition Impact Symptoms, labs Goal is weight maintenance or no more than 2# weight loss per month.  Next Visit: PRN at patient or provider request  Gennaro Africa, RDN, LDN Registered Dietitian, [facility] Part Time Remote (Usual office hours: Tuesday-Thursday) Cell: [phone]

## 2022-12-24 ENCOUNTER — Ambulatory Visit (HOSPITAL_COMMUNITY)
Admission: RE | Admit: 2022-12-24 | Discharge: 2022-12-24 | Disposition: A | Discharge status: Home / Self Care | Payer: Medicare Other | Source: Ambulatory Visit | Attending: Hematology & Oncology | Admitting: Hematology & Oncology

## 2022-12-24 DIAGNOSIS — C641 Malignant neoplasm of right kidney, except renal pelvis: Secondary | ICD-10-CM | POA: Diagnosis present

## 2022-12-24 LAB — GLUCOSE, CAPILLARY: Glucose-Capillary: 90 mg/dL (ref 70–99)

## 2022-12-24 MED ORDER — FLUDEOXYGLUCOSE F - 18 (FDG) INJECTION
11.1000 | Freq: Once | INTRAVENOUS | Status: AC
  Administered 2022-12-24: 10.92 via INTRAVENOUS

## 2023-01-08 ENCOUNTER — Encounter: Payer: Self-pay | Admitting: *Deleted

## 2023-01-09 ENCOUNTER — Encounter: Payer: Self-pay | Admitting: Hematology & Oncology

## 2023-01-09 ENCOUNTER — Inpatient Hospital Stay: Payer: Medicare Other | Attending: Hematology & Oncology

## 2023-01-09 ENCOUNTER — Inpatient Hospital Stay: Payer: Medicare Other

## 2023-01-09 ENCOUNTER — Inpatient Hospital Stay (HOSPITAL_BASED_OUTPATIENT_CLINIC_OR_DEPARTMENT_OTHER): Payer: Medicare Other | Admitting: Hematology & Oncology

## 2023-01-09 VITALS — BP 105/65 | HR 64 | Resp 17

## 2023-01-09 VITALS — BP 115/64 | HR 73 | Temp 98.6°F | Resp 18 | Ht 75.0 in | Wt 219.5 lb

## 2023-01-09 DIAGNOSIS — C7951 Secondary malignant neoplasm of bone: Secondary | ICD-10-CM | POA: Diagnosis present

## 2023-01-09 DIAGNOSIS — R600 Localized edema: Secondary | ICD-10-CM

## 2023-01-09 DIAGNOSIS — Z79620 Long term (current) use of immunosuppressive biologic: Secondary | ICD-10-CM | POA: Diagnosis not present

## 2023-01-09 DIAGNOSIS — C641 Malignant neoplasm of right kidney, except renal pelvis: Secondary | ICD-10-CM

## 2023-01-09 DIAGNOSIS — Z7952 Long term (current) use of systemic steroids: Secondary | ICD-10-CM | POA: Insufficient documentation

## 2023-01-09 DIAGNOSIS — M7989 Other specified soft tissue disorders: Secondary | ICD-10-CM | POA: Insufficient documentation

## 2023-01-09 DIAGNOSIS — Z5112 Encounter for antineoplastic immunotherapy: Secondary | ICD-10-CM | POA: Diagnosis present

## 2023-01-09 LAB — CMP (CANCER CENTER ONLY)
ALT: 15 U/L (ref 0–44)
AST: 10 U/L — ABNORMAL LOW (ref 15–41)
Albumin: 3.7 g/dL (ref 3.5–5.0)
Alkaline Phosphatase: 101 U/L (ref 38–126)
Anion gap: 6 (ref 5–15)
BUN: 28 mg/dL — ABNORMAL HIGH (ref 8–23)
CO2: 27 mmol/L (ref 22–32)
Calcium: 10.7 mg/dL — ABNORMAL HIGH (ref 8.9–10.3)
Chloride: 110 mmol/L (ref 98–111)
Creatinine: 0.83 mg/dL (ref 0.61–1.24)
GFR, Estimated: >60 mL/min (ref >60)
Glucose, Bld: 106 mg/dL — ABNORMAL HIGH (ref 70–99)
Potassium: 3.9 mmol/L (ref 3.5–5.1)
Sodium: 143 mmol/L (ref 135–145)
Total Bilirubin: 0.5 mg/dL (ref <1.2)
Total Protein: 6 g/dL — ABNORMAL LOW (ref 6.5–8.1)

## 2023-01-09 LAB — CBC WITH DIFFERENTIAL (CANCER CENTER ONLY)
Abs Immature Granulocytes: 0.01 10*3/uL (ref 0.00–0.07)
Basophils Absolute: 0 10*3/uL (ref 0.0–0.1)
Basophils Relative: 1 %
Eosinophils Absolute: 0.1 10*3/uL (ref 0.0–0.5)
Eosinophils Relative: 3 %
HCT: 41 % (ref 39.0–52.0)
Hemoglobin: 13.6 g/dL (ref 13.0–17.0)
Immature Granulocytes: 0 %
Lymphocytes Relative: 26 %
Lymphs Abs: 1.5 10*3/uL (ref 0.7–4.0)
MCH: 32.2 pg (ref 26.0–34.0)
MCHC: 33.2 g/dL (ref 30.0–36.0)
MCV: 96.9 fL (ref 80.0–100.0)
Monocytes Absolute: 0.6 10*3/uL (ref 0.1–1.0)
Monocytes Relative: 10 %
Neutro Abs: 3.4 10*3/uL (ref 1.7–7.7)
Neutrophils Relative %: 60 %
Platelet Count: 182 10*3/uL (ref 150–400)
RBC: 4.23 MIL/uL (ref 4.22–5.81)
RDW: 13.4 % (ref 11.5–15.5)
WBC Count: 5.6 10*3/uL (ref 4.0–10.5)
nRBC: 0 % (ref 0.0–0.2)

## 2023-01-09 LAB — LACTATE DEHYDROGENASE: LDH: 131 U/L (ref 98–192)

## 2023-01-09 LAB — TSH: TSH: 1.291 u[IU]/mL (ref 0.350–4.500)

## 2023-01-09 MED ORDER — SODIUM CHLORIDE 0.9 % IV SOLN
480.0000 mg | Freq: Once | INTRAVENOUS | Status: AC
  Administered 2023-01-09: 480 mg via INTRAVENOUS
  Filled 2023-01-09: qty 48

## 2023-01-09 MED ORDER — LORAZEPAM 1 MG PO TABS: 2.0000 mg | ORAL_TABLET | ORAL | 0 refills | Status: AC | PRN

## 2023-01-09 MED ORDER — SODIUM CHLORIDE 0.9 % IV SOLN: Freq: Once | INTRAVENOUS | Status: AC

## 2023-01-09 NOTE — Progress Notes (Signed)
Hematology and Oncology Follow Up Visit  Francis Cunningham [id number] [birth year] 72 y.o. 01/09/2023   Principle Diagnosis:  Metastatic renal cell carcinoma-bone only metastasis  Current Therapy:   Maintenance nivolumab-Q 6-week dosing -- s/p cycle #24 --changed to 6-week dosing on 04/03/2022     Interim History:  [name] is back for follow-up.  Everything is doing quite well.  Apparently, the prednisone that would put him on really helped him.  He is on a low-dose prednisone.  This really has made him feel a whole lot better.  He also went to Dr. Jerl Santos [facility].  He had some injections into his knees would also help.  He is doing physical therapy right now.  He has had no issues with nausea or vomiting.  He has had no change in bowel or bladder habits.  Had a PET scan that was done.  The PET scan was done on 12/24/2022.  Thankfully, the PET scan showed that he had no evidence of active metastatic disease.  He has had no rashes.  He has little swelling in the legs.  Mostly, the right leg has little swelling which is chronic.  His appetite has been quite good.  He had a very nice Thanksgiving.  He is looking forward to Christmas.  Overall, I would say his performance status is probably ECOG 1.     Medications:  Current Outpatient Medications:    alfuzosin (UROXATRAL) 10 MG 24 hr tablet, Take 10 mg by mouth at bedtime., Disp: , Rfl:    ascorbic acid (VITAMIN C) 500 MG tablet, Take 500 mg by mouth in the morning., Disp: , Rfl:    Cholecalciferol 25 MCG (1000 UT) tablet, Take 2,000 Units by mouth daily., Disp: , Rfl:    escitalopram (LEXAPRO) 20 MG tablet, Take 20 mg by mouth daily., Disp: , Rfl:    finasteride (PROSCAR) 5 MG tablet, Take 5 mg by mouth daily., Disp: , Rfl:    guaiFENesin (MUCINEX) 600 MG 12 hr tablet, Take 600 mg by mouth in the morning and at bedtime., Disp: , Rfl:    LORazepam (ATIVAN) 1 MG tablet, Take 2 mg by mouth as needed. Ona tablest as  needed for procedures and imaging, Disp: , Rfl:    Magnesium Carbonate POWD, Take 325 mg by mouth daily. 1 1/2  teaspoons daily., Disp: , Rfl:    melatonin 3 MG TABS tablet, Take 3 mg by mouth at bedtime., Disp: , Rfl:    midodrine (PROAMATINE) 2.5 MG tablet, TAKE ONE (1) TABLET BY MOUTH 3 TIMES DAILY, Disp: 90 tablet, Rfl: 8   montelukast (SINGULAIR) 10 MG tablet, Take 1 tablet by mouth daily., Disp: , Rfl:    Multiple Vitamin (MULTI-VITAMINS) TABS, Take 1 tablet by mouth daily., Disp: , Rfl:    nivolumab (OPDIVO) 100 MG/10ML SOLN chemo injection, Inject into the vein., Disp: , Rfl:    Omega-3 Fatty Acids (FISH OIL PO), Take 5 mLs by mouth daily., Disp: , Rfl:    polyethylene glycol powder (GLYCOLAX/MIRALAX) 17 GM/SCOOP powder, Take 17 g by mouth at bedtime., Disp: , Rfl:    Potassium Citrate-Citric Acid 3300-1002 MG PACK, Take by mouth daily., Disp: , Rfl:    predniSONE (DELTASONE) 10 MG tablet, Take 0.5 tablets (5 mg total) by mouth daily. (Patient taking differently: Take 10 mg by mouth daily.), Disp: 90 tablet, Rfl: 6   Probiotic Product (PROBIOTIC ACIDOPHILUS BEADS PO), Take by mouth daily., Disp: , Rfl:    vitamin  E 180 MG (400 UNITS) capsule, Take 400 Units by mouth every morning., Disp: , Rfl:    acetaminophen (TYLENOL) 325 MG tablet, Take 325 mg by mouth every 6 (six) hours as needed. (Patient not taking: Reported on 01/09/2023), Disp: , Rfl:    Hypertonic Nasal Wash (SINUS RINSE) PACK, Irrigate with as directed. (Patient not taking: Reported on 01/09/2023), Disp: , Rfl:   Current Facility-Administered Medications:    dexamethasone (DECADRON) injection 10 mg, 10 mg, Intravenous, Once,    dexamethasone (DECADRON) injection 10 mg, 10 mg, Intravenous, Once,   Allergies:  Allergies  Allergen Reactions   Morphine Nausea Only    Past Medical History, Surgical history, Social history, and Family History were reviewed and updated.  Review of Systems: Review of Systems  Constitutional:  Negative.   HENT:   Positive for mouth sores.   Eyes: Negative.   Respiratory: Negative.    Cardiovascular: Negative.   Gastrointestinal: Negative.   Endocrine: Negative.   Genitourinary: Negative.    Musculoskeletal:  Positive for arthralgias and back pain.  Skin: Negative.   Neurological: Negative.   Hematological: Negative.   Psychiatric/Behavioral: Negative.      Physical Exam: Temperature is 98.6.  Pulse 73.  Blood pressure 115/64.  Weight is 219 pounds.      Wt Readings from Last 3 Encounters:  01/09/23 219 lb 8 oz (99.6 kg)  11/28/22 222 lb (100.7 kg)  10/17/22 232 lb 1.3 oz (105.3 kg)    Physical Exam Vitals reviewed.  HENT:     Head: Normocephalic and atraumatic.  Eyes:     Pupils: Pupils are equal, round, and reactive to light.  Cardiovascular:     Rate and Rhythm: Normal rate and regular rhythm.     Heart sounds: Normal heart sounds.  Pulmonary:     Effort: Pulmonary effort is normal.     Breath sounds: Normal breath sounds.  Abdominal:     General: Bowel sounds are normal.     Palpations: Abdomen is soft.  Musculoskeletal:        General: No tenderness or deformity. Normal range of motion.     Cervical back: Normal range of motion.  Lymphadenopathy:     Cervical: No cervical adenopathy.  Skin:    General: Skin is warm and dry.     Findings: No erythema or rash.  Neurological:     Mental Status: He is alert and oriented to person, place, and time.  Psychiatric:        Behavior: Behavior normal.        Thought Content: Thought content normal.        Judgment: Judgment normal.      Lab Results  Component Value Date   WBC 5.6 01/09/2023   HGB 13.6 01/09/2023   HCT 41.0 01/09/2023   MCV 96.9 01/09/2023   PLT 182 01/09/2023     Chemistry      Component Value Date/Time   NA 143 01/09/2023 0929   K 3.9 01/09/2023 0929   CL 110 01/09/2023 0929   CO2 27 01/09/2023 0929   BUN 28 (H) 01/09/2023 0929   CREATININE 0.83 01/09/2023 0929       Component Value Date/Time   CALCIUM 10.7 (H) 01/09/2023 0929   ALKPHOS 101 01/09/2023 0929   AST 10 (L) 01/09/2023 0929   ALT 15 01/09/2023 0929   BILITOT 0.5 01/09/2023 0929      Impression and Plan: [name] is a very nice 72 year old white male.  He has metastatic renal cell carcinoma.  I am so happy that the PET scan looks so good.  He is done incredibly well with immunotherapy.  I am glad that his knees are doing better.  He is doing well with physical therapy.  Of note, his last TSH back in October was 2.6.  We will still plan to get him back in 6 weeks.     [name], MD 12/11/202410:33 AM

## 2023-01-09 NOTE — Addendum Note (Signed)
Addended by: Arlan Organ R on: 01/09/2023 11:12 AM   Modules accepted: Orders

## 2023-01-09 NOTE — Patient Instructions (Signed)
[facility]  Discharge Instructions: Thank you for choosing [facility] to provide your oncology and hematology care.   If you have a lab appointment with the Cancer Center, please go directly to the Cancer Center and check in at the registration area.  Wear comfortable clothing and clothing appropriate for easy access to any Portacath or PICC line.   We strive to give you quality time with your provider. You may need to reschedule your appointment if you arrive late (15 or more minutes).  Arriving late affects you and other patients whose appointments are after yours.  Also, if you miss three or more appointments without notifying the office, you may be dismissed from the clinic at the provider's discretion.      For prescription refill requests, have your pharmacy contact our office and allow 72 hours for refills to be completed.    Today you received the following chemotherapy and/or immunotherapy agents opdivo      To help prevent nausea and vomiting after your treatment, we encourage you to take your nausea medication as directed.  BELOW ARE SYMPTOMS THAT SHOULD BE REPORTED IMMEDIATELY: *FEVER GREATER THAN 100.4 F (38 C) OR HIGHER *CHILLS OR SWEATING *NAUSEA AND VOMITING THAT IS NOT CONTROLLED WITH YOUR NAUSEA MEDICATION *UNUSUAL SHORTNESS OF BREATH *UNUSUAL BRUISING OR BLEEDING *URINARY PROBLEMS (pain or burning when urinating, or frequent urination) *BOWEL PROBLEMS (unusual diarrhea, constipation, pain near the anus) TENDERNESS IN MOUTH AND THROAT WITH OR WITHOUT PRESENCE OF ULCERS (sore throat, sores in mouth, or a toothache) UNUSUAL RASH, SWELLING OR PAIN  UNUSUAL VAGINAL DISCHARGE OR ITCHING   Items with * indicate a potential emergency and should be followed up as soon as possible or go to the Emergency Department if any problems should occur.  Please show the CHEMOTHERAPY ALERT CARD or IMMUNOTHERAPY ALERT  CARD at check-in to the Emergency Department and triage nurse. Should you have questions after your visit or need to cancel or reschedule your appointment, please contact [facility] - A DEPT OF [facility]  [phone] and follow the prompts.  Office hours are 8:00 a.m. to 4:30 p.m. Monday - Friday. Please note that voicemails left after 4:00 p.m. may not be returned until the following business day.  We are closed weekends and major holidays. You have access to a nurse at all times for urgent questions. Please call the main number to the clinic [phone] and follow the prompts.  For any non-urgent questions, you may also contact your provider using MyChart. We now offer e-Visits for anyone 24 and older to request care online for non-urgent symptoms. For details visit [email or web address].   Also download the MyChart app! Go to the app store, search "MyChart", open the app, select [place], and log in with your MyChart username and password.

## 2023-01-10 ENCOUNTER — Other Ambulatory Visit: Payer: Self-pay

## 2023-01-12 ENCOUNTER — Other Ambulatory Visit: Payer: Self-pay

## 2023-01-13 ENCOUNTER — Encounter: Payer: Self-pay | Admitting: Hematology & Oncology

## 2023-02-05 ENCOUNTER — Other Ambulatory Visit: Payer: Self-pay

## 2023-02-14 ENCOUNTER — Other Ambulatory Visit: Payer: Self-pay

## 2023-02-20 ENCOUNTER — Inpatient Hospital Stay: Payer: Medicare Other | Attending: Hematology & Oncology

## 2023-02-20 ENCOUNTER — Inpatient Hospital Stay: Payer: Medicare Other

## 2023-02-20 ENCOUNTER — Inpatient Hospital Stay: Payer: Medicare Other | Admitting: Medical Oncology

## 2023-02-20 ENCOUNTER — Encounter: Payer: Self-pay | Admitting: Medical Oncology

## 2023-02-20 VITALS — BP 97/57 | HR 75 | Temp 97.8°F | Resp 18 | Ht 75.0 in | Wt 223.4 lb

## 2023-02-20 VITALS — BP 97/61 | HR 65

## 2023-02-20 DIAGNOSIS — Z7952 Long term (current) use of systemic steroids: Secondary | ICD-10-CM | POA: Insufficient documentation

## 2023-02-20 DIAGNOSIS — Z2989 Encounter for other specified prophylactic measures: Secondary | ICD-10-CM | POA: Diagnosis not present

## 2023-02-20 DIAGNOSIS — M7989 Other specified soft tissue disorders: Secondary | ICD-10-CM | POA: Insufficient documentation

## 2023-02-20 DIAGNOSIS — C7951 Secondary malignant neoplasm of bone: Secondary | ICD-10-CM | POA: Insufficient documentation

## 2023-02-20 DIAGNOSIS — C641 Malignant neoplasm of right kidney, except renal pelvis: Secondary | ICD-10-CM

## 2023-02-20 DIAGNOSIS — Z79620 Long term (current) use of immunosuppressive biologic: Secondary | ICD-10-CM | POA: Insufficient documentation

## 2023-02-20 DIAGNOSIS — Z5112 Encounter for antineoplastic immunotherapy: Secondary | ICD-10-CM | POA: Insufficient documentation

## 2023-02-20 LAB — CMP (CANCER CENTER ONLY)
ALT: 13 U/L (ref 0–44)
AST: 10 U/L — ABNORMAL LOW (ref 15–41)
Albumin: 3.7 g/dL (ref 3.5–5.0)
Alkaline Phosphatase: 97 U/L (ref 38–126)
Anion gap: 6 (ref 5–15)
BUN: 26 mg/dL — ABNORMAL HIGH (ref 8–23)
CO2: 26 mmol/L (ref 22–32)
Calcium: 10.4 mg/dL — ABNORMAL HIGH (ref 8.9–10.3)
Chloride: 110 mmol/L (ref 98–111)
Creatinine: 0.88 mg/dL (ref 0.61–1.24)
GFR, Estimated: >60 mL/min (ref >60)
Glucose, Bld: 103 mg/dL — ABNORMAL HIGH (ref 70–99)
Potassium: 3.7 mmol/L (ref 3.5–5.1)
Sodium: 142 mmol/L (ref 135–145)
Total Bilirubin: 0.5 mg/dL (ref 0.0–1.2)
Total Protein: 5.6 g/dL — ABNORMAL LOW (ref 6.5–8.1)

## 2023-02-20 LAB — CBC WITH DIFFERENTIAL (CANCER CENTER ONLY)
Abs Immature Granulocytes: 0.02 10*3/uL (ref 0.00–0.07)
Basophils Absolute: 0 10*3/uL (ref 0.0–0.1)
Basophils Relative: 1 %
Eosinophils Absolute: 0.1 10*3/uL (ref 0.0–0.5)
Eosinophils Relative: 3 %
HCT: 41.7 % (ref 39.0–52.0)
Hemoglobin: 14 g/dL (ref 13.0–17.0)
Immature Granulocytes: 0 %
Lymphocytes Relative: 32 %
Lymphs Abs: 1.6 10*3/uL (ref 0.7–4.0)
MCH: 32.3 pg (ref 26.0–34.0)
MCHC: 33.6 g/dL (ref 30.0–36.0)
MCV: 96.1 fL (ref 80.0–100.0)
Monocytes Absolute: 0.7 10*3/uL (ref 0.1–1.0)
Monocytes Relative: 13 %
Neutro Abs: 2.6 10*3/uL (ref 1.7–7.7)
Neutrophils Relative %: 51 %
Platelet Count: 175 10*3/uL (ref 150–400)
RBC: 4.34 MIL/uL (ref 4.22–5.81)
RDW: 13.3 % (ref 11.5–15.5)
WBC Count: 5 10*3/uL (ref 4.0–10.5)
nRBC: 0 % (ref 0.0–0.2)

## 2023-02-20 LAB — TSH: TSH: 1.884 u[IU]/mL (ref 0.350–4.500)

## 2023-02-20 MED ORDER — SODIUM CHLORIDE 0.9 % IV SOLN: Freq: Once | INTRAVENOUS | Status: AC

## 2023-02-20 MED ORDER — NIVOLUMAB CHEMO INJECTION 100 MG/10ML
480.0000 mg | Freq: Once | INTRAVENOUS | Status: AC
  Administered 2023-02-20: 480 mg via INTRAVENOUS
  Filled 2023-02-20: qty 48

## 2023-02-20 NOTE — Progress Notes (Signed)
Hematology and Oncology Follow Up Visit  Francis Cunningham [id number] 04-25-1950 73 y.o. 02/20/2023  Principle Diagnosis:  Metastatic renal cell carcinoma-bone only metastasis  Current Therapy:   Maintenance nivolumab-Q 6-week dosing -- s/p cycle #25 --changed to 6-week dosing on 04/03/2022     Interim History:  Francis Cunningham is back for follow-up.    Today they report that he is doing really well. Since his last visit he has had some nose cauterizations for nosebleeds. This went well.   He continues to complete PT for balance. Working with PCP/card on his BP which runs lower. No falls. Working on hydration.   He has had no issues with nausea or vomiting.  He has had no change in bowel or bladder habits.  His last PET scan was done on 12/24/2022.  Thankfully, the PET scan showed that he had no evidence of active metastatic disease.  He has had no rashes.  He has little swelling in the legs.  Mostly, the right leg has little swelling which is chronic.  His appetite has been quite good.  Overall, I would say his performance status is probably ECOG 1.    Wt Readings from Last 3 Encounters:  02/20/23 223 lb 6.4 oz (101.3 kg)  01/09/23 219 lb 8 oz (99.6 kg)  11/28/22 222 lb (100.7 kg)    Medications:  Current Outpatient Medications:    acetaminophen (TYLENOL) 325 MG tablet, Take 325 mg by mouth every 6 (six) hours as needed., Disp: , Rfl:    alfuzosin (UROXATRAL) 10 MG 24 hr tablet, Take 10 mg by mouth at bedtime., Disp: , Rfl:    ascorbic acid (VITAMIN C) 500 MG tablet, Take 500 mg by mouth in the morning., Disp: , Rfl:    Cholecalciferol 25 MCG (1000 UT) tablet, Take 2,000 Units by mouth daily., Disp: , Rfl:    escitalopram (LEXAPRO) 20 MG tablet, Take 20 mg by mouth daily., Disp: , Rfl:    finasteride (PROSCAR) 5 MG tablet, Take 5 mg by mouth daily., Disp: , Rfl:    guaiFENesin (MUCINEX) 600 MG 12 hr tablet, Take 600 mg by mouth in the morning and at bedtime., Disp: , Rfl:     Hypertonic Nasal Wash (SINUS RINSE) PACK, Irrigate with as directed., Disp: , Rfl:    Magnesium Carbonate POWD, Take 325 mg by mouth daily. 1 1/2  teaspoons daily., Disp: , Rfl:    melatonin 3 MG TABS tablet, Take 3 mg by mouth at bedtime., Disp: , Rfl:    midodrine (PROAMATINE) 2.5 MG tablet, TAKE ONE (1) TABLET BY MOUTH 3 TIMES DAILY, Disp: 90 tablet, Rfl: 8   montelukast (SINGULAIR) 10 MG tablet, Take 1 tablet by mouth daily., Disp: , Rfl:    Multiple Vitamin (MULTI-VITAMINS) TABS, Take 1 tablet by mouth daily., Disp: , Rfl:    nivolumab (OPDIVO) 100 MG/10ML SOLN chemo injection, Inject into the vein., Disp: , Rfl:    Omega-3 Fatty Acids (FISH OIL PO), Take 5 mLs by mouth daily., Disp: , Rfl:    polyethylene glycol powder (GLYCOLAX/MIRALAX) 17 GM/SCOOP powder, Take 17 g by mouth at bedtime., Disp: , Rfl:    Potassium Citrate-Citric Acid 3300-1002 MG PACK, Take by mouth daily., Disp: , Rfl:    predniSONE (DELTASONE) 10 MG tablet, Take 0.5 tablets (5 mg total) by mouth daily. (Patient taking differently: Take 10 mg by mouth daily.), Disp: 90 tablet, Rfl: 6   Probiotic Product (PROBIOTIC ACIDOPHILUS BEADS PO), Take by mouth daily.,  Disp: , Rfl:    vitamin E 180 MG (400 UNITS) capsule, Take 400 Units by mouth every morning., Disp: , Rfl:    LORazepam (ATIVAN) 1 MG tablet, Take 2 tablets (2 mg total) by mouth as needed. Ona tablest as needed for procedures and imaging (Patient not taking: Reported on 02/20/2023), Disp: 30 tablet, Rfl: 0  Current Facility-Administered Medications:    dexamethasone (DECADRON) injection 10 mg, 10 mg, Intravenous, Once,    dexamethasone (DECADRON) injection 10 mg, 10 mg, Intravenous, Once,   Allergies:  Allergies  Allergen Reactions   Morphine Nausea Only    Past Medical History, Surgical history, Social history, and Family History were reviewed and updated.  Review of Systems: Review of Systems  Constitutional: Negative.   HENT:   Negative for mouth  sores.   Eyes: Negative.   Respiratory: Negative.    Cardiovascular: Negative.   Gastrointestinal: Negative.   Endocrine: Negative.   Genitourinary: Negative.    Musculoskeletal:  Positive for arthralgias and back pain.  Skin: Negative.   Neurological: Negative.   Hematological: Negative.   Psychiatric/Behavioral: Negative.      Physical Exam: Temperature is 98.6.  Pulse 73.  Blood pressure 115/64.  Weight is 219 pounds.      Wt Readings from Last 3 Encounters:  02/20/23 223 lb 6.4 oz (101.3 kg)  01/09/23 219 lb 8 oz (99.6 kg)  11/28/22 222 lb (100.7 kg)    Physical Exam Vitals reviewed.  HENT:     Head: Normocephalic and atraumatic.  Eyes:     Pupils: Pupils are equal, round, and reactive to light.  Cardiovascular:     Rate and Rhythm: Normal rate and regular rhythm.     Heart sounds: Normal heart sounds.  Pulmonary:     Effort: Pulmonary effort is normal.     Breath sounds: Normal breath sounds.  Abdominal:     General: Bowel sounds are normal.     Palpations: Abdomen is soft.  Musculoskeletal:        General: No tenderness or deformity. Normal range of motion.     Cervical back: Normal range of motion.  Lymphadenopathy:     Cervical: No cervical adenopathy.  Skin:    General: Skin is warm and dry.     Findings: No erythema or rash.  Neurological:     Mental Status: He is alert and oriented to person, place, and time.  Psychiatric:        Behavior: Behavior normal.        Thought Content: Thought content normal.        Judgment: Judgment normal.      Lab Results  Component Value Date   WBC 5.0 02/20/2023   HGB 14.0 02/20/2023   HCT 41.7 02/20/2023   MCV 96.1 02/20/2023   PLT 175 02/20/2023     Chemistry      Component Value Date/Time   NA 142 02/20/2023 0922   K 3.7 02/20/2023 0922   CL 110 02/20/2023 0922   CO2 26 02/20/2023 0922   BUN 26 (H) 02/20/2023 0922   CREATININE 0.88 02/20/2023 0922      Component Value Date/Time   CALCIUM 10.4  (H) 02/20/2023 0922   ALKPHOS 97 02/20/2023 0922   AST 10 (L) 02/20/2023 0922   ALT 13 02/20/2023 0922   BILITOT 0.5 02/20/2023 3244     Encounter Diagnoses  Name Primary?   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  He has metastatic renal cell carcinoma. He is doing really well on maintenance Nivolumab which he gets every 6 weeks.   CBC and CMP reviewed today and acceptable for treatment   RTC 6 weeks MD, labs (CBC w/, CMP), Nivolumab      Rushie Chestnut, PA-C 1/22/202510:12 AM

## 2023-02-20 NOTE — Patient Instructions (Signed)
 [facility]  Discharge Instructions: Thank you for choosing [facility] to provide your oncology and hematology care.   If you have a lab appointment with the Cancer Center, please go directly to the Cancer Center and check in at the registration area.  Wear comfortable clothing and clothing appropriate for easy access to any Portacath or PICC line.   We strive to give you quality time with your provider. You may need to reschedule your appointment if you arrive late (15 or more minutes).  Arriving late affects you and other patients whose appointments are after yours.  Also, if you miss three or more appointments without notifying the office, you may be dismissed from the clinic at the provider's discretion.      For prescription refill requests, have your pharmacy contact our office and allow 72 hours for refills to be completed.    Today you received the following chemotherapy and/or immunotherapy agents opdivo      To help prevent nausea and vomiting after your treatment, we encourage you to take your nausea medication as directed.  BELOW ARE SYMPTOMS THAT SHOULD BE REPORTED IMMEDIATELY: *FEVER GREATER THAN 100.4 F (38 C) OR HIGHER *CHILLS OR SWEATING *NAUSEA AND VOMITING THAT IS NOT CONTROLLED WITH YOUR NAUSEA MEDICATION *UNUSUAL SHORTNESS OF BREATH *UNUSUAL BRUISING OR BLEEDING *URINARY PROBLEMS (pain or burning when urinating, or frequent urination) *BOWEL PROBLEMS (unusual diarrhea, constipation, pain near the anus) TENDERNESS IN MOUTH AND THROAT WITH OR WITHOUT PRESENCE OF ULCERS (sore throat, sores in mouth, or a toothache) UNUSUAL RASH, SWELLING OR PAIN  UNUSUAL VAGINAL DISCHARGE OR ITCHING   Items with * indicate a potential emergency and should be followed up as soon as possible or go to the Emergency Department if any problems should occur.  Please show the CHEMOTHERAPY ALERT CARD or IMMUNOTHERAPY ALERT  CARD at check-in to the Emergency Department and triage nurse. Should you have questions after your visit or need to cancel or reschedule your appointment, please contact [facility] - A DEPT OF [facility]  [phone] and follow the prompts.  Office hours are 8:00 a.m. to 4:30 p.m. Monday - Friday. Please note that voicemails left after 4:00 p.m. may not be returned until the following business day.  We are closed weekends and major holidays. You have access to a nurse at all times for urgent questions. Please call the main number to the clinic [phone] and follow the prompts.  For any non-urgent questions, you may also contact your provider using MyChart. We now offer e-Visits for anyone 24 and older to request care online for non-urgent symptoms. For details visit [email or web address].   Also download the MyChart app! Go to the app store, search "MyChart", open the app, select [place], and log in with your MyChart username and password.

## 2023-02-21 LAB — T4: T4, Total: 4.1 ug/dL — ABNORMAL LOW (ref 4.5–12.0)

## 2023-04-03 ENCOUNTER — Encounter: Payer: Self-pay | Admitting: Hematology & Oncology

## 2023-04-03 ENCOUNTER — Inpatient Hospital Stay: Payer: Medicare Other | Attending: Hematology & Oncology

## 2023-04-03 ENCOUNTER — Inpatient Hospital Stay (HOSPITAL_BASED_OUTPATIENT_CLINIC_OR_DEPARTMENT_OTHER): Payer: Medicare Other | Admitting: Hematology & Oncology

## 2023-04-03 ENCOUNTER — Inpatient Hospital Stay: Payer: Medicare Other

## 2023-04-03 VITALS — BP 99/62 | HR 58

## 2023-04-03 VITALS — BP 94/64 | HR 76 | Temp 97.9°F | Resp 20 | Ht 75.0 in | Wt 224.0 lb

## 2023-04-03 DIAGNOSIS — Z7952 Long term (current) use of systemic steroids: Secondary | ICD-10-CM | POA: Insufficient documentation

## 2023-04-03 DIAGNOSIS — R7303 Prediabetes: Secondary | ICD-10-CM | POA: Diagnosis not present

## 2023-04-03 DIAGNOSIS — Z5112 Encounter for antineoplastic immunotherapy: Secondary | ICD-10-CM | POA: Insufficient documentation

## 2023-04-03 DIAGNOSIS — C641 Malignant neoplasm of right kidney, except renal pelvis: Secondary | ICD-10-CM | POA: Insufficient documentation

## 2023-04-03 DIAGNOSIS — Z79620 Long term (current) use of immunosuppressive biologic: Secondary | ICD-10-CM | POA: Diagnosis not present

## 2023-04-03 DIAGNOSIS — C7951 Secondary malignant neoplasm of bone: Secondary | ICD-10-CM | POA: Insufficient documentation

## 2023-04-03 LAB — CBC WITH DIFFERENTIAL (CANCER CENTER ONLY)
Abs Immature Granulocytes: 0.01 10*3/uL (ref 0.00–0.07)
Basophils Absolute: 0 10*3/uL (ref 0.0–0.1)
Basophils Relative: 1 %
Eosinophils Absolute: 0.1 10*3/uL (ref 0.0–0.5)
Eosinophils Relative: 3 %
HCT: 40.6 % (ref 39.0–52.0)
Hemoglobin: 13.7 g/dL (ref 13.0–17.0)
Immature Granulocytes: 0 %
Lymphocytes Relative: 35 %
Lymphs Abs: 1.6 10*3/uL (ref 0.7–4.0)
MCH: 32.5 pg (ref 26.0–34.0)
MCHC: 33.7 g/dL (ref 30.0–36.0)
MCV: 96.4 fL (ref 80.0–100.0)
Monocytes Absolute: 0.6 10*3/uL (ref 0.1–1.0)
Monocytes Relative: 13 %
Neutro Abs: 2.2 10*3/uL (ref 1.7–7.7)
Neutrophils Relative %: 48 %
Platelet Count: 154 10*3/uL (ref 150–400)
RBC: 4.21 MIL/uL — ABNORMAL LOW (ref 4.22–5.81)
RDW: 13.1 % (ref 11.5–15.5)
WBC Count: 4.5 10*3/uL (ref 4.0–10.5)
nRBC: 0 % (ref 0.0–0.2)

## 2023-04-03 LAB — CMP (CANCER CENTER ONLY)
ALT: 14 U/L (ref 0–44)
AST: 9 U/L — ABNORMAL LOW (ref 15–41)
Albumin: 3.8 g/dL (ref 3.5–5.0)
Alkaline Phosphatase: 93 U/L (ref 38–126)
Anion gap: 5 (ref 5–15)
BUN: 26 mg/dL — ABNORMAL HIGH (ref 8–23)
CO2: 28 mmol/L (ref 22–32)
Calcium: 10.7 mg/dL — ABNORMAL HIGH (ref 8.9–10.3)
Chloride: 108 mmol/L (ref 98–111)
Creatinine: 0.82 mg/dL (ref 0.61–1.24)
GFR, Estimated: >60 mL/min (ref >60)
Glucose, Bld: 87 mg/dL (ref 70–99)
Potassium: 3.9 mmol/L (ref 3.5–5.1)
Sodium: 141 mmol/L (ref 135–145)
Total Bilirubin: 0.5 mg/dL (ref 0.0–1.2)
Total Protein: 5.8 g/dL — ABNORMAL LOW (ref 6.5–8.1)

## 2023-04-03 LAB — TSH: TSH: 1.455 u[IU]/mL (ref 0.350–4.500)

## 2023-04-03 MED ORDER — SODIUM CHLORIDE 0.9 % IV SOLN: Freq: Once | INTRAVENOUS | Status: AC

## 2023-04-03 MED ORDER — SODIUM CHLORIDE 0.9 % IV SOLN
480.0000 mg | Freq: Once | INTRAVENOUS | Status: AC
  Administered 2023-04-03: 480 mg via INTRAVENOUS
  Filled 2023-04-03: qty 48

## 2023-04-03 NOTE — Patient Instructions (Signed)
 [facility]  Discharge Instructions: Thank you for choosing [facility] to provide your oncology and hematology care.   If you have a lab appointment with the Cancer Center, please go directly to the Cancer Center and check in at the registration area.  Wear comfortable clothing and clothing appropriate for easy access to any Portacath or PICC line.   We strive to give you quality time with your provider. You may need to reschedule your appointment if you arrive late (15 or more minutes).  Arriving late affects you and other patients whose appointments are after yours.  Also, if you miss three or more appointments without notifying the office, you may be dismissed from the clinic at the provider's discretion.      For prescription refill requests, have your pharmacy contact our office and allow 72 hours for refills to be completed.    Today you received the following chemotherapy and/or immunotherapy agents Nivolumab    To help prevent nausea and vomiting after your treatment, we encourage you to take your nausea medication as directed.  BELOW ARE SYMPTOMS THAT SHOULD BE REPORTED IMMEDIATELY: *FEVER GREATER THAN 100.4 F (38 C) OR HIGHER *CHILLS OR SWEATING *NAUSEA AND VOMITING THAT IS NOT CONTROLLED WITH YOUR NAUSEA MEDICATION *UNUSUAL SHORTNESS OF BREATH *UNUSUAL BRUISING OR BLEEDING *URINARY PROBLEMS (pain or burning when urinating, or frequent urination) *BOWEL PROBLEMS (unusual diarrhea, constipation, pain near the anus) TENDERNESS IN MOUTH AND THROAT WITH OR WITHOUT PRESENCE OF ULCERS (sore throat, sores in mouth, or a toothache) UNUSUAL RASH, SWELLING OR PAIN  UNUSUAL VAGINAL DISCHARGE OR ITCHING   Items with * indicate a potential emergency and should be followed up as soon as possible or go to the Emergency Department if any problems should occur.  Please show the CHEMOTHERAPY ALERT CARD or IMMUNOTHERAPY  ALERT CARD at check-in to the Emergency Department and triage nurse. Should you have questions after your visit or need to cancel or reschedule your appointment, please contact [facility] - A DEPT OF [facility]  [phone] and follow the prompts.  Office hours are 8:00 a.m. to 4:30 p.m. Monday - Friday. Please note that voicemails left after 4:00 p.m. may not be returned until the following business day.  We are closed weekends and major holidays. You have access to a nurse at all times for urgent questions. Please call the main number to the clinic [phone] and follow the prompts.  For any non-urgent questions, you may also contact your provider using MyChart. We now offer e-Visits for anyone 14 and older to request care online for non-urgent symptoms. For details visit [email or web address].   Also download the MyChart app! Go to the app store, search "MyChart", open the app, select [place], and log in with your MyChart username and password.

## 2023-04-03 NOTE — Progress Notes (Signed)
 [facility] Follow Up Visit  Francis Cunningham [id number] 1950/06/16 73 y.o. 04/03/2023   Principle Diagnosis:  Metastatic renal cell carcinoma-bone only metastasis  Current Therapy:   Maintenance nivolumab-Q 6-week dosing -- s/p cycle #25 --changed to 6-week dosing on 04/03/2022     Interim History:  Francis Cunningham is back for follow-up.  He continues to do quite nicely.  He saw Dr. Jerl Santos for his knees.  He has some injections.  He is doing physical therapy.  The physical therapy is also helping with his balance.  His appetite has been good.  He has had no nausea or vomiting.  He has had no cough or shortness of breath.  There has been no rashes.  He has had no bleeding.  He has had no headache.  There is been no issues with traveling.  Of note, his last TSH in January was 1.884.  His wife comes in with him.  She has been quite busy.  Overall, I will have to say that his performance status is ECOG 0.   Medications:  Current Outpatient Medications:    acetaminophen (TYLENOL) 325 MG tablet, Take 325 mg by mouth every 6 (six) hours as needed., Disp: , Rfl:    alfuzosin (UROXATRAL) 10 MG 24 hr tablet, Take 10 mg by mouth at bedtime., Disp: , Rfl:    ascorbic acid (VITAMIN C) 500 MG tablet, Take 500 mg by mouth in the morning., Disp: , Rfl:    Cholecalciferol 25 MCG (1000 UT) tablet, Take 2,000 Units by mouth daily., Disp: , Rfl:    escitalopram (LEXAPRO) 20 MG tablet, Take 20 mg by mouth daily., Disp: , Rfl:    finasteride (PROSCAR) 5 MG tablet, Take 5 mg by mouth daily., Disp: , Rfl:    guaiFENesin (MUCINEX) 600 MG 12 hr tablet, Take 600 mg by mouth in the morning and at bedtime., Disp: , Rfl:    Hypertonic Nasal Wash (SINUS RINSE) PACK, Irrigate with as directed., Disp: , Rfl:    Magnesium Carbonate POWD, Take 325 mg by mouth daily. 1 1/2  teaspoons daily., Disp: , Rfl:    melatonin 3 MG TABS tablet, Take 3 mg by mouth at bedtime., Disp: , Rfl:    midodrine  (PROAMATINE) 2.5 MG tablet, TAKE ONE (1) TABLET BY MOUTH 3 TIMES DAILY, Disp: 90 tablet, Rfl: 8   montelukast (SINGULAIR) 10 MG tablet, Take 1 tablet by mouth daily., Disp: , Rfl:    Multiple Vitamin (MULTI-VITAMINS) TABS, Take 1 tablet by mouth daily., Disp: , Rfl:    nivolumab (OPDIVO) 100 MG/10ML SOLN chemo injection, Inject into the vein., Disp: , Rfl:    Omega-3 Fatty Acids (FISH OIL PO), Take 5 mLs by mouth daily., Disp: , Rfl:    polyethylene glycol powder (GLYCOLAX/MIRALAX) 17 GM/SCOOP powder, Take 17 g by mouth at bedtime., Disp: , Rfl:    Potassium Citrate-Citric Acid 3300-1002 MG PACK, Take by mouth daily., Disp: , Rfl:    predniSONE (DELTASONE) 10 MG tablet, Take 0.5 tablets (5 mg total) by mouth daily. (Patient taking differently: Take 10 mg by mouth daily.), Disp: 90 tablet, Rfl: 6   Probiotic Product (PROBIOTIC ACIDOPHILUS BEADS PO), Take by mouth daily., Disp: , Rfl:    vitamin E 180 MG (400 UNITS) capsule, Take 400 Units by mouth every morning., Disp: , Rfl:    LORazepam (ATIVAN) 1 MG tablet, Take 2 tablets (2 mg total) by mouth as needed. Ona tablest as needed for procedures  and imaging (Patient not taking: Reported on 04/03/2023), Disp: 30 tablet, Rfl: 0  Current Facility-Administered Medications:    dexamethasone (DECADRON) injection 10 mg, 10 mg, Intravenous, Once,    dexamethasone (DECADRON) injection 10 mg, 10 mg, Intravenous, Once,   Allergies:  Allergies  Allergen Reactions   Morphine Nausea Only    Past Medical History, Surgical history, Social history, and Family History were reviewed and updated.  Review of Systems: Review of Systems  Constitutional: Negative.   HENT:   Positive for mouth sores.   Eyes: Negative.   Respiratory: Negative.    Cardiovascular: Negative.   Gastrointestinal: Negative.   Endocrine: Negative.   Genitourinary: Negative.    Musculoskeletal:  Positive for arthralgias and back pain.  Skin: Negative.   Neurological: Negative.    Hematological: Negative.   Psychiatric/Behavioral: Negative.      Physical Exam: Temperature is 97.9.  Pulse 76.  Blood pressure 94/64.  Weight is 224 pounds.    Wt Readings from Last 3 Encounters:  04/03/23 224 lb (101.6 kg)  02/20/23 223 lb 6.4 oz (101.3 kg)  01/09/23 219 lb 8 oz (99.6 kg)    Physical Exam Vitals reviewed.  HENT:     Head: Normocephalic and atraumatic.  Eyes:     Pupils: Pupils are equal, round, and reactive to light.  Cardiovascular:     Rate and Rhythm: Normal rate and regular rhythm.     Heart sounds: Normal heart sounds.  Pulmonary:     Effort: Pulmonary effort is normal.     Breath sounds: Normal breath sounds.  Abdominal:     General: Bowel sounds are normal.     Palpations: Abdomen is soft.  Musculoskeletal:        General: No tenderness or deformity. Normal range of motion.     Cervical back: Normal range of motion.  Lymphadenopathy:     Cervical: No cervical adenopathy.  Skin:    General: Skin is warm and dry.     Findings: No erythema or rash.  Neurological:     Mental Status: He is alert and oriented to person, place, and time.  Psychiatric:        Behavior: Behavior normal.        Thought Content: Thought content normal.        Judgment: Judgment normal.      Lab Results  Component Value Date   WBC 4.5 04/03/2023   HGB 13.7 04/03/2023   HCT 40.6 04/03/2023   MCV 96.4 04/03/2023   PLT 154 04/03/2023     Chemistry      Component Value Date/Time   NA 142 02/20/2023 0922   K 3.7 02/20/2023 0922   CL 110 02/20/2023 0922   CO2 26 02/20/2023 0922   BUN 26 (H) 02/20/2023 0922   CREATININE 0.88 02/20/2023 0922      Component Value Date/Time   CALCIUM 10.4 (H) 02/20/2023 0922   ALKPHOS 97 02/20/2023 0922   AST 10 (L) 02/20/2023 0922   ALT 13 02/20/2023 0922   BILITOT 0.5 02/20/2023 1610      Impression and Plan: Francis Cunningham is a very nice 73 year old white male.  He has metastatic renal cell carcinoma.  We will go  ahead with his nivolumab.  He is done so well with this.  We will set him up with a PET scan.  Will do this in a month.  I am happy that his quality of life is doing so well.  I am  not surprised that Dr. Jerl Santos of Orthopedic Surgery is helping with his knees to allow him to have better function and as such, better quality of life.  We will plan to get him back in 6 weeks.     [name], MD 3/5/202510:01 AM

## 2023-04-04 ENCOUNTER — Other Ambulatory Visit: Payer: Self-pay

## 2023-04-04 LAB — T4: T4, Total: 4.6 ug/dL (ref 4.5–12.0)

## 2023-04-08 ENCOUNTER — Other Ambulatory Visit: Payer: Self-pay

## 2023-05-06 ENCOUNTER — Ambulatory Visit (HOSPITAL_COMMUNITY)
Admission: RE | Admit: 2023-05-06 | Discharge: 2023-05-06 | Disposition: A | Discharge status: Home / Self Care | Source: Ambulatory Visit | Attending: Hematology & Oncology | Admitting: Hematology & Oncology

## 2023-05-06 DIAGNOSIS — C641 Malignant neoplasm of right kidney, except renal pelvis: Secondary | ICD-10-CM | POA: Diagnosis present

## 2023-05-06 LAB — GLUCOSE, CAPILLARY: Glucose-Capillary: 101 mg/dL — ABNORMAL HIGH (ref 70–99)

## 2023-05-06 MED ORDER — FLUDEOXYGLUCOSE F - 18 (FDG) INJECTION
11.0000 | Freq: Once | INTRAVENOUS | Status: AC | PRN
  Administered 2023-05-06: 11.15 via INTRAVENOUS

## 2023-05-15 ENCOUNTER — Inpatient Hospital Stay: Admitting: Hematology & Oncology

## 2023-05-15 ENCOUNTER — Encounter: Payer: Self-pay | Admitting: Hematology & Oncology

## 2023-05-15 ENCOUNTER — Inpatient Hospital Stay

## 2023-05-15 ENCOUNTER — Inpatient Hospital Stay: Attending: Hematology & Oncology

## 2023-05-15 VITALS — BP 101/70 | HR 68 | Temp 97.6°F | Resp 18 | Wt 225.4 lb

## 2023-05-15 DIAGNOSIS — Z5112 Encounter for antineoplastic immunotherapy: Secondary | ICD-10-CM | POA: Diagnosis present

## 2023-05-15 DIAGNOSIS — C641 Malignant neoplasm of right kidney, except renal pelvis: Secondary | ICD-10-CM | POA: Insufficient documentation

## 2023-05-15 DIAGNOSIS — C7951 Secondary malignant neoplasm of bone: Secondary | ICD-10-CM | POA: Diagnosis present

## 2023-05-15 DIAGNOSIS — C73 Malignant neoplasm of thyroid gland: Secondary | ICD-10-CM | POA: Diagnosis not present

## 2023-05-15 DIAGNOSIS — Z7952 Long term (current) use of systemic steroids: Secondary | ICD-10-CM | POA: Insufficient documentation

## 2023-05-15 DIAGNOSIS — Z79620 Long term (current) use of immunosuppressive biologic: Secondary | ICD-10-CM | POA: Diagnosis not present

## 2023-05-15 LAB — CBC WITH DIFFERENTIAL (CANCER CENTER ONLY)
Abs Immature Granulocytes: 0.02 10*3/uL (ref 0.00–0.07)
Basophils Absolute: 0 10*3/uL (ref 0.0–0.1)
Basophils Relative: 1 %
Eosinophils Absolute: 0.1 10*3/uL (ref 0.0–0.5)
Eosinophils Relative: 2 %
HCT: 40.1 % (ref 39.0–52.0)
Hemoglobin: 13.2 g/dL (ref 13.0–17.0)
Immature Granulocytes: 0 %
Lymphocytes Relative: 22 %
Lymphs Abs: 1.3 10*3/uL (ref 0.7–4.0)
MCH: 31.8 pg (ref 26.0–34.0)
MCHC: 32.9 g/dL (ref 30.0–36.0)
MCV: 96.6 fL (ref 80.0–100.0)
Monocytes Absolute: 0.6 10*3/uL (ref 0.1–1.0)
Monocytes Relative: 11 %
Neutro Abs: 3.7 10*3/uL (ref 1.7–7.7)
Neutrophils Relative %: 64 %
Platelet Count: 175 10*3/uL (ref 150–400)
RBC: 4.15 MIL/uL — ABNORMAL LOW (ref 4.22–5.81)
RDW: 12.8 % (ref 11.5–15.5)
WBC Count: 5.8 10*3/uL (ref 4.0–10.5)
nRBC: 0 % (ref 0.0–0.2)

## 2023-05-15 LAB — CMP (CANCER CENTER ONLY)
ALT: 11 U/L (ref 0–44)
AST: 8 U/L — ABNORMAL LOW (ref 15–41)
Albumin: 3.7 g/dL (ref 3.5–5.0)
Alkaline Phosphatase: 86 U/L (ref 38–126)
Anion gap: 6 (ref 5–15)
BUN: 27 mg/dL — ABNORMAL HIGH (ref 8–23)
CO2: 27 mmol/L (ref 22–32)
Calcium: 10.6 mg/dL — ABNORMAL HIGH (ref 8.9–10.3)
Chloride: 109 mmol/L (ref 98–111)
Creatinine: 0.88 mg/dL (ref 0.61–1.24)
GFR, Estimated: >60 mL/min (ref >60)
Glucose, Bld: 104 mg/dL — ABNORMAL HIGH (ref 70–99)
Potassium: 4.1 mmol/L (ref 3.5–5.1)
Sodium: 142 mmol/L (ref 135–145)
Total Bilirubin: 0.4 mg/dL (ref 0.0–1.2)
Total Protein: 5.7 g/dL — ABNORMAL LOW (ref 6.5–8.1)

## 2023-05-15 LAB — TSH: TSH: 1.514 u[IU]/mL (ref 0.350–4.500)

## 2023-05-15 LAB — LACTATE DEHYDROGENASE: LDH: 120 U/L (ref 98–192)

## 2023-05-15 MED ORDER — SODIUM CHLORIDE 0.9 % IV SOLN: Freq: Once | INTRAVENOUS | Status: AC

## 2023-05-15 MED ORDER — SODIUM CHLORIDE 0.9 % IV SOLN
480.0000 mg | Freq: Once | INTRAVENOUS | Status: AC
  Administered 2023-05-15: 480 mg via INTRAVENOUS
  Filled 2023-05-15: qty 48

## 2023-05-15 NOTE — Patient Instructions (Addendum)
 [facility] - A DEPT OF [facility]  Discharge Instructions: Thank you for choosing [facility] to provide your oncology and hematology care.   If you have a lab appointment with the Cancer Center, please go directly to the Cancer Center and check in at the registration area.  Wear comfortable clothing and clothing appropriate for easy access to any Portacath or PICC line.   We strive to give you quality time with your provider. You may need to reschedule your appointment if you arrive late (15 or more minutes).  Arriving late affects you and other patients whose appointments are after yours.  Also, if you miss three or more appointments without notifying the office, you may be dismissed from the clinic at the provider's discretion.      For prescription refill requests, have your pharmacy contact our office and allow 72 hours for refills to be completed.    Today you received the following chemotherapy and/or immunotherapy agents opdivo       To help prevent nausea and vomiting after your treatment, we encourage you to take your nausea medication as directed.  BELOW ARE SYMPTOMS THAT SHOULD BE REPORTED IMMEDIATELY: *FEVER GREATER THAN 100.4 F (38 C) OR HIGHER *CHILLS OR SWEATING *NAUSEA AND VOMITING THAT IS NOT CONTROLLED WITH YOUR NAUSEA MEDICATION *UNUSUAL SHORTNESS OF BREATH *UNUSUAL BRUISING OR BLEEDING *URINARY PROBLEMS (pain or burning when urinating, or frequent urination) *BOWEL PROBLEMS (unusual diarrhea, constipation, pain near the anus) TENDERNESS IN MOUTH AND THROAT WITH OR WITHOUT PRESENCE OF ULCERS (sore throat, sores in mouth, or a toothache) UNUSUAL RASH, SWELLING OR PAIN  UNUSUAL VAGINAL DISCHARGE OR ITCHING   Items with * indicate a potential emergency and should be followed up as soon as possible or go to the Emergency Department if any problems should occur.  Please show the CHEMOTHERAPY ALERT CARD or IMMUNOTHERAPY  ALERT CARD at check-in to the Emergency Department and triage nurse. Should you have questions after your visit or need to cancel or reschedule your appointment, please contact [facility] - A DEPT OF [facility]  [phone] and follow the prompts.  Office hours are 8:00 a.m. to 4:30 p.m. Monday - Friday. Please note that voicemails left after 4:00 p.m. may not be returned until the following business day.  We are closed weekends and major holidays. You have access to a nurse at all times for urgent questions. Please call the main number to the clinic [phone] and follow the prompts.  For any non-urgent questions, you may also contact your provider using MyChart. We now offer e-Visits for anyone 11 and older to request care online for non-urgent symptoms. For details visit [email or web address].   Also download the MyChart app! Go to the app store, search "MyChart", open the app, select [place], and log in with your MyChart username and password.  [facility] - A DEPT OF [place]  Discharge Instructions: Thank you for choosing  Cancer Center to provide your oncology and hematology care.   If you have a lab appointment with the Cancer Center, please go directly to the Cancer Center and check in at the registration area.  Wear comfortable clothing and clothing appropriate for easy access to any Portacath or PICC line.   We strive to give you quality time with your provider. You may need to reschedule your appointment if you arrive late (15 or  more minutes).  Arriving late affects you and other patients whose appointments are after yours.  Also, if you miss three or more appointments without notifying the office, you may be dismissed from the clinic at the provider's discretion.      For prescription refill requests, have your pharmacy contact our office and allow 72 hours for refills to be completed.    Today  you received the following chemotherapy and/or immunotherapy agents Nivolumab      To help prevent nausea and vomiting after your treatment, we encourage you to take your nausea medication as directed.  BELOW ARE SYMPTOMS THAT SHOULD BE REPORTED IMMEDIATELY: *FEVER GREATER THAN 100.4 F (38 C) OR HIGHER *CHILLS OR SWEATING *NAUSEA AND VOMITING THAT IS NOT CONTROLLED WITH YOUR NAUSEA MEDICATION *UNUSUAL SHORTNESS OF BREATH *UNUSUAL BRUISING OR BLEEDING *URINARY PROBLEMS (pain or burning when urinating, or frequent urination) *BOWEL PROBLEMS (unusual diarrhea, constipation, pain near the anus) TENDERNESS IN MOUTH AND THROAT WITH OR WITHOUT PRESENCE OF ULCERS (sore throat, sores in mouth, or a toothache) UNUSUAL RASH, SWELLING OR PAIN  UNUSUAL VAGINAL DISCHARGE OR ITCHING   Items with * indicate a potential emergency and should be followed up as soon as possible or go to the Emergency Department if any problems should occur.  Please show the CHEMOTHERAPY ALERT CARD or IMMUNOTHERAPY ALERT CARD at check-in to the Emergency Department and triage nurse. Should you have questions after your visit or need to cancel or reschedule your appointment, please contact [facility] - A DEPT OF [facility]  [phone] and follow the prompts.  Office hours are 8:00 a.m. to 4:30 p.m. Monday - Friday. Please note that voicemails left after 4:00 p.m. may not be returned until the following business day.  We are closed weekends and major holidays. You have access to a nurse at all times for urgent questions. Please call the main number to the clinic [phone] and follow the prompts.  For any non-urgent questions, you may also contact your provider using MyChart. We now offer e-Visits for anyone 43 and older to request care online for non-urgent symptoms. For details visit [email or web address].   Also download the MyChart app! Go to the app store, search "MyChart", open the app,  select [place], and log in with your MyChart username and password.

## 2023-05-15 NOTE — Progress Notes (Signed)
 [facility] H[facility] and On[facility] Follow Up Visit  Brenin Heidelberger III [id number] May 05, 1950 73 y.o. 05/15/2023   Principle Diagnosis:  Metastatic renal cell carcinoma-bone only metastasis  Current Therapy:   Maintenance nivolumab-Q 6-week dosing -- s/p cycle #26 --changed to 6-week dosing on 04/03/2022     Interim History:  Mr. Raul Cabot III is back for follow-up.  So far, he has been doing pretty well.  He did have a PET scan that was done on 05/06/2023.  Unfortunately this has not been read yet.  However, I have to believe that this should be okay.  He is doing physical therapy twice a week for his on the left knee.  He is trying exercise a bit more.  He is eating okay.  He has had no problems with nausea or vomiting.  He has had no problems with cough or shortness of breath.  There is been no change in bowel or bladder habits.  His last TSH in March was 1.5.  Overall, I would say that his performance status is probably ECOG 1.    Medications:  Current Outpatient Medications:    acetaminophen (TYLENOL) 325 MG tablet, Take 325 mg by mouth every 6 (six) hours as needed., Disp: , Rfl:    alfuzosin (UROXATRAL) 10 MG 24 hr tablet, Take 10 mg by mouth at bedtime., Disp: , Rfl:    ascorbic acid (VITAMIN C) 500 MG tablet, Take 500 mg by mouth in the morning., Disp: , Rfl:    Cholecalciferol 25 MCG (1000 UT) tablet, Take 2,000 Units by mouth daily., Disp: , Rfl:    escitalopram (LEXAPRO) 20 MG tablet, Take 20 mg by mouth daily., Disp: , Rfl:    finasteride (PROSCAR) 5 MG tablet, Take 5 mg by mouth daily., Disp: , Rfl:    guaiFENesin (MUCINEX) 600 MG 12 hr tablet, Take 600 mg by mouth in the morning and at bedtime., Disp: , Rfl:    Hypertonic Nasal Wash (SINUS RINSE) PACK, Irrigate with as directed., Disp: , Rfl:    LORazepam (ATIVAN) 1 MG tablet, Take 2 tablets (2 mg total) by mouth as needed. Ona tablest as needed for procedures and imaging, Disp: 30 tablet, Rfl: 0   Magnesium Carbonate POWD, Take  325 mg by mouth daily. 1 1/2  teaspoons daily., Disp: , Rfl:    melatonin 3 MG TABS tablet, Take 3 mg by mouth at bedtime., Disp: , Rfl:    midodrine (PROAMATINE) 2.5 MG tablet, TAKE ONE (1) TABLET BY MOUTH 3 TIMES DAILY, Disp: 90 tablet, Rfl: 8   montelukast (SINGULAIR) 10 MG tablet, Take 1 tablet by mouth daily., Disp: , Rfl:    Multiple Vitamin (MULTI-VITAMINS) TABS, Take 1 tablet by mouth daily., Disp: , Rfl:    nivolumab (OPDIVO) 100 MG/10ML SOLN chemo injection, Inject into the vein., Disp: , Rfl:    Omega-3 Fatty Acids (FISH OIL PO), Take 5 mLs by mouth daily., Disp: , Rfl:    polyethylene glycol powder (GLYCOLAX/MIRALAX) 17 GM/SCOOP powder, Take 17 g by mouth at bedtime., Disp: , Rfl:    Potassium Citrate-Citric Acid 3300-1002 MG PACK, Take by mouth daily., Disp: , Rfl:    predniSONE (DELTASONE) 10 MG tablet, Take 0.5 tablets (5 mg total) by mouth daily. (Patient taking differently: Take 10 mg by mouth daily.), Disp: 90 tablet, Rfl: 6   Probiotic Product (PROBIOTIC ACIDOPHILUS BEADS PO), Take by mouth daily., Disp: , Rfl:    vitamin E 180 MG (400 UNITS) capsule, Take 400 Units by  mouth every morning., Disp: , Rfl:   Current Facility-Administered Medications:    dexamethasone (DECADRON) injection 10 mg, 10 mg, Intravenous, Once,    dexamethasone (DECADRON) injection 10 mg, 10 mg, Intravenous, Once,   Allergies:  Allergies  Allergen Reactions   Morphine Nausea Only    Past Medical History, Surgical history, Social history, and Family History were reviewed and updated.  Review of Systems: Review of Systems  Constitutional: Negative.   HENT:   Positive for mouth sores.   Eyes: Negative.   Respiratory: Negative.    Cardiovascular: Negative.   Gastrointestinal: Negative.   Endocrine: Negative.   Genitourinary: Negative.    Musculoskeletal:  Positive for arthralgias and back pain.  Skin: Negative.   Neurological: Negative.   Hematological: Negative.   Psychiatric/Behavioral:  Negative.      Physical Exam: Temperature is 97.6.  Pulse 68.  Blood pressure 101/70.  Weight is 225 pounds.    Wt Readings from Last 3 Encounters:  05/15/23 225 lb 6.4 oz (102.2 kg)  04/03/23 224 lb (101.6 kg)  02/20/23 223 lb 6.4 oz (101.3 kg)    Physical Exam Vitals reviewed.  HENT:     Head: Normocephalic and atraumatic.  Eyes:     Pupils: Pupils are equal, round, and reactive to light.  Cardiovascular:     Rate and Rhythm: Normal rate and regular rhythm.     Heart sounds: Normal heart sounds.  Pulmonary:     Effort: Pulmonary effort is normal.     Breath sounds: Normal breath sounds.  Abdominal:     General: Bowel sounds are normal.     Palpations: Abdomen is soft.  Musculoskeletal:        General: No tenderness or deformity. Normal range of motion.     Cervical back: Normal range of motion.  Lymphadenopathy:     Cervical: No cervical adenopathy.  Skin:    General: Skin is warm and dry.     Findings: No erythema or rash.  Neurological:     Mental Status: He is alert and oriented to person, place, and time.  Psychiatric:        Behavior: Behavior normal.        Thought Content: Thought content normal.        Judgment: Judgment normal.      Lab Results  Component Value Date   WBC 5.8 05/15/2023   HGB 13.2 05/15/2023   HCT 40.1 05/15/2023   MCV 96.6 05/15/2023   PLT 175 05/15/2023     Chemistry      Component Value Date/Time   NA 142 05/15/2023 0845   K 4.1 05/15/2023 0845   CL 109 05/15/2023 0845   CO2 27 05/15/2023 0845   BUN 27 (H) 05/15/2023 0845   CREATININE 0.88 05/15/2023 0845      Component Value Date/Time   CALCIUM 10.6 (H) 05/15/2023 0845   ALKPHOS 86 05/15/2023 0845   AST 8 (L) 05/15/2023 0845   ALT 11 05/15/2023 0845   BILITOT 0.4 05/15/2023 0845      Impression and Plan: Mr. Roney Marion III is a very nice 73 year old white male.  He has metastatic renal cell carcinoma.  Again, happily that the PET scan should be okay.  Will continue  him on that nivolumab.  I think he is done incredibly well with this.  He has had no toxicity from this.  Hopefully, he will continue to do better with his knees.  I know that physical therapy is helping him.  Hopefully his balance will continue to improve.  I will plan to get him back in 6 weeks.    Ivor Mars, MD 4/16/20259:43 AM

## 2023-05-16 ENCOUNTER — Other Ambulatory Visit: Payer: Self-pay

## 2023-05-16 LAB — T4: T4, Total: 4 ug/dL — ABNORMAL LOW (ref 4.5–12.0)

## 2023-05-17 ENCOUNTER — Encounter: Payer: Self-pay | Admitting: *Deleted

## 2023-05-29 ENCOUNTER — Other Ambulatory Visit: Payer: Self-pay

## 2023-05-30 ENCOUNTER — Other Ambulatory Visit: Payer: Self-pay

## 2023-06-27 ENCOUNTER — Encounter: Payer: Self-pay | Admitting: Hematology & Oncology

## 2023-06-27 ENCOUNTER — Other Ambulatory Visit: Payer: Self-pay

## 2023-06-27 ENCOUNTER — Inpatient Hospital Stay: Attending: Hematology & Oncology

## 2023-06-27 ENCOUNTER — Inpatient Hospital Stay (HOSPITAL_BASED_OUTPATIENT_CLINIC_OR_DEPARTMENT_OTHER): Admitting: Hematology & Oncology

## 2023-06-27 ENCOUNTER — Inpatient Hospital Stay

## 2023-06-27 VITALS — BP 96/59 | HR 80 | Temp 97.7°F | Resp 18 | Ht 75.0 in | Wt 227.0 lb

## 2023-06-27 VITALS — BP 97/56 | HR 57 | Resp 17

## 2023-06-27 DIAGNOSIS — C641 Malignant neoplasm of right kidney, except renal pelvis: Secondary | ICD-10-CM

## 2023-06-27 DIAGNOSIS — Z5112 Encounter for antineoplastic immunotherapy: Secondary | ICD-10-CM | POA: Diagnosis present

## 2023-06-27 DIAGNOSIS — R7303 Prediabetes: Secondary | ICD-10-CM | POA: Diagnosis not present

## 2023-06-27 DIAGNOSIS — C7951 Secondary malignant neoplasm of bone: Secondary | ICD-10-CM | POA: Diagnosis present

## 2023-06-27 DIAGNOSIS — Z79620 Long term (current) use of immunosuppressive biologic: Secondary | ICD-10-CM | POA: Diagnosis not present

## 2023-06-27 DIAGNOSIS — Z7952 Long term (current) use of systemic steroids: Secondary | ICD-10-CM | POA: Insufficient documentation

## 2023-06-27 LAB — CBC WITH DIFFERENTIAL (CANCER CENTER ONLY)
Abs Immature Granulocytes: 0.01 10*3/uL (ref 0.00–0.07)
Basophils Absolute: 0 10*3/uL (ref 0.0–0.1)
Basophils Relative: 1 %
Eosinophils Absolute: 0.1 10*3/uL (ref 0.0–0.5)
Eosinophils Relative: 3 %
HCT: 40 % (ref 39.0–52.0)
Hemoglobin: 13.3 g/dL (ref 13.0–17.0)
Immature Granulocytes: 0 %
Lymphocytes Relative: 38 %
Lymphs Abs: 1.7 10*3/uL (ref 0.7–4.0)
MCH: 31.7 pg (ref 26.0–34.0)
MCHC: 33.3 g/dL (ref 30.0–36.0)
MCV: 95.2 fL (ref 80.0–100.0)
Monocytes Absolute: 0.5 10*3/uL (ref 0.1–1.0)
Monocytes Relative: 10 %
Neutro Abs: 2.2 10*3/uL (ref 1.7–7.7)
Neutrophils Relative %: 48 %
Platelet Count: 163 10*3/uL (ref 150–400)
RBC: 4.2 MIL/uL — ABNORMAL LOW (ref 4.22–5.81)
RDW: 13 % (ref 11.5–15.5)
WBC Count: 4.5 10*3/uL (ref 4.0–10.5)
nRBC: 0 % (ref 0.0–0.2)

## 2023-06-27 LAB — CMP (CANCER CENTER ONLY)
ALT: 14 U/L (ref 0–44)
AST: 10 U/L — ABNORMAL LOW (ref 15–41)
Albumin: 3.8 g/dL (ref 3.5–5.0)
Alkaline Phosphatase: 85 U/L (ref 38–126)
Anion gap: 5 (ref 5–15)
BUN: 25 mg/dL — ABNORMAL HIGH (ref 8–23)
CO2: 27 mmol/L (ref 22–32)
Calcium: 10.2 mg/dL (ref 8.9–10.3)
Chloride: 108 mmol/L (ref 98–111)
Creatinine: 0.9 mg/dL (ref 0.61–1.24)
GFR, Estimated: >60 mL/min (ref >60)
Glucose, Bld: 104 mg/dL — ABNORMAL HIGH (ref 70–99)
Potassium: 3.9 mmol/L (ref 3.5–5.1)
Sodium: 140 mmol/L (ref 135–145)
Total Bilirubin: 0.5 mg/dL (ref 0.0–1.2)
Total Protein: 5.2 g/dL — ABNORMAL LOW (ref 6.5–8.1)

## 2023-06-27 LAB — TSH: TSH: 1.31 u[IU]/mL (ref 0.350–4.500)

## 2023-06-27 MED ORDER — SODIUM CHLORIDE 0.9 % IV SOLN: Freq: Once | INTRAVENOUS | Status: AC

## 2023-06-27 MED ORDER — SODIUM CHLORIDE 0.9 % IV SOLN
480.0000 mg | Freq: Once | INTRAVENOUS | Status: AC
  Administered 2023-06-27: 480 mg via INTRAVENOUS
  Filled 2023-06-27: qty 48

## 2023-06-27 NOTE — Patient Instructions (Signed)
 [facility]  Discharge Instructions: Thank you for choosing [facility] to provide your oncology and hematology care.   If you have a lab appointment with the Cancer Center, please go directly to the Cancer Center and check in at the registration area.  Wear comfortable clothing and clothing appropriate for easy access to any Portacath or PICC line.   We strive to give you quality time with your provider. You may need to reschedule your appointment if you arrive late (15 or more minutes).  Arriving late affects you and other patients whose appointments are after yours.  Also, if you miss three or more appointments without notifying the office, you may be dismissed from the clinic at the provider's discretion.      For prescription refill requests, have your pharmacy contact our office and allow 72 hours for refills to be completed.    Today you received the following chemotherapy and/or immunotherapy agents Nivolumab    To help prevent nausea and vomiting after your treatment, we encourage you to take your nausea medication as directed.  BELOW ARE SYMPTOMS THAT SHOULD BE REPORTED IMMEDIATELY: *FEVER GREATER THAN 100.4 F (38 C) OR HIGHER *CHILLS OR SWEATING *NAUSEA AND VOMITING THAT IS NOT CONTROLLED WITH YOUR NAUSEA MEDICATION *UNUSUAL SHORTNESS OF BREATH *UNUSUAL BRUISING OR BLEEDING *URINARY PROBLEMS (pain or burning when urinating, or frequent urination) *BOWEL PROBLEMS (unusual diarrhea, constipation, pain near the anus) TENDERNESS IN MOUTH AND THROAT WITH OR WITHOUT PRESENCE OF ULCERS (sore throat, sores in mouth, or a toothache) UNUSUAL RASH, SWELLING OR PAIN  UNUSUAL VAGINAL DISCHARGE OR ITCHING   Items with * indicate a potential emergency and should be followed up as soon as possible or go to the Emergency Department if any problems should occur.  Please show the CHEMOTHERAPY ALERT CARD or IMMUNOTHERAPY  ALERT CARD at check-in to the Emergency Department and triage nurse. Should you have questions after your visit or need to cancel or reschedule your appointment, please contact [facility] - A DEPT OF [facility]  [phone] and follow the prompts.  Office hours are 8:00 a.m. to 4:30 p.m. Monday - Friday. Please note that voicemails left after 4:00 p.m. may not be returned until the following business day.  We are closed weekends and major holidays. You have access to a nurse at all times for urgent questions. Please call the main number to the clinic [phone] and follow the prompts.  For any non-urgent questions, you may also contact your provider using MyChart. We now offer e-Visits for anyone 14 and older to request care online for non-urgent symptoms. For details visit [email or web address].   Also download the MyChart app! Go to the app store, search "MyChart", open the app, select [place], and log in with your MyChart username and password.

## 2023-06-27 NOTE — Progress Notes (Signed)
 [facility] Follow Up Visit  Francis Cunningham [id number] 14-Jan-1951 73 y.o. 06/27/2023   Principle Diagnosis:  Metastatic renal cell carcinoma-bone only metastasis  Current Therapy:   Maintenance nivolumab -Q 6-week dosing -- s/p cycle #26 --changed to 6-week dosing on 04/03/2022     Interim History:  [name] is back for follow-up.  Everything is doing quite well formant.  Last PET scan that we did home, back in early April, showed that he had no evidence of active malignancy.  He had a wonderful Memorial Day weekend.  He and his wife were in their home.  They had a nice cookout.  He has had no problems with pain.  Has had no problems with cough or shortness of breath.  He has had no bleeding.  Is no headache.  He has had no leg swelling.  His last TSH was 1.5.  Overall, I would say that his performance status is probably ECOG 1.    Medications:  Current Outpatient Medications:    acetaminophen  (TYLENOL ) 325 MG tablet, Take 325 mg by mouth every 6 (six) hours as needed., Disp: , Rfl:    alfuzosin  (UROXATRAL ) 10 MG 24 hr tablet, Take 10 mg by mouth at bedtime., Disp: , Rfl:    ascorbic acid (VITAMIN C) 500 MG tablet, Take 500 mg by mouth in the morning., Disp: , Rfl:    Cholecalciferol 25 MCG (1000 UT) tablet, Take 2,000 Units by mouth daily., Disp: , Rfl:    escitalopram  (LEXAPRO ) 20 MG tablet, Take 20 mg by mouth daily., Disp: , Rfl:    finasteride  (PROSCAR ) 5 MG tablet, Take 5 mg by mouth daily., Disp: , Rfl:    guaiFENesin (MUCINEX) 600 MG 12 hr tablet, Take 600 mg by mouth in the morning and at bedtime., Disp: , Rfl:    Hypertonic Nasal Wash (SINUS RINSE) PACK, Irrigate with as directed., Disp: , Rfl:    LORazepam  (ATIVAN ) 1 MG tablet, Take 2 tablets (2 mg total) by mouth as needed. Ona tablest as needed for procedures and imaging, Disp: 30 tablet, Rfl: 0   Magnesium Carbonate POWD, Take 325 mg by mouth daily. 1 1/2  teaspoons daily., Disp: , Rfl:     melatonin 3 MG TABS tablet, Take 3 mg by mouth at bedtime., Disp: , Rfl:    midodrine  (PROAMATINE ) 2.5 MG tablet, TAKE ONE (1) TABLET BY MOUTH 3 TIMES DAILY, Disp: 90 tablet, Rfl: 8   montelukast  (SINGULAIR ) 10 MG tablet, Take 1 tablet by mouth daily., Disp: , Rfl:    Multiple Vitamin (MULTI-VITAMINS) TABS, Take 1 tablet by mouth daily., Disp: , Rfl:    nivolumab  (OPDIVO ) 100 MG/10ML SOLN chemo injection, Inject into the vein., Disp: , Rfl:    Omega-3 Fatty Acids (FISH OIL PO), Take 5 mLs by mouth daily., Disp: , Rfl:    polyethylene glycol powder (GLYCOLAX /MIRALAX ) 17 GM/SCOOP powder, Take 17 g by mouth at bedtime., Disp: , Rfl:    Potassium Citrate-Citric Acid 3300-1002 MG PACK, Take by mouth daily., Disp: , Rfl:    predniSONE  (DELTASONE ) 10 MG tablet, Take 0.5 tablets (5 mg total) by mouth daily. (Patient taking differently: Take 10 mg by mouth daily.), Disp: 90 tablet, Rfl: 6   Probiotic Product (PROBIOTIC ACIDOPHILUS BEADS PO), Take by mouth daily., Disp: , Rfl:    vitamin E 180 MG (400 UNITS) capsule, Take 400 Units by mouth every morning., Disp: , Rfl:   Current Facility-Administered Medications:    dexamethasone  (DECADRON )  injection 10 mg, 10 mg, Intravenous, Once,    dexamethasone  (DECADRON ) injection 10 mg, 10 mg, Intravenous, Once,   Allergies:  Allergies  Allergen Reactions   Morphine Nausea Only    Past Medical History, Surgical history, Social history, and Family History were reviewed and updated.  Review of Systems: Review of Systems  Constitutional: Negative.   HENT:   Positive for mouth sores.   Eyes: Negative.   Respiratory: Negative.    Cardiovascular: Negative.   Gastrointestinal: Negative.   Endocrine: Negative.   Genitourinary: Negative.    Musculoskeletal:  Positive for arthralgias and back pain.  Skin: Negative.   Neurological: Negative.   Hematological: Negative.   Psychiatric/Behavioral: Negative.      Physical Exam: Temperature is 97.7.  Pulse 80.   Blood pressure 96/59.  Weight is 227 pounds.      Wt Readings from Last 3 Encounters:  06/27/23 227 lb (103 kg)  05/15/23 225 lb 6.4 oz (102.2 kg)  04/03/23 224 lb (101.6 kg)    Physical Exam Vitals reviewed.  HENT:     Head: Normocephalic and atraumatic.  Eyes:     Pupils: Pupils are equal, round, and reactive to light.  Cardiovascular:     Rate and Rhythm: Normal rate and regular rhythm.     Heart sounds: Normal heart sounds.  Pulmonary:     Effort: Pulmonary effort is normal.     Breath sounds: Normal breath sounds.  Abdominal:     General: Bowel sounds are normal.     Palpations: Abdomen is soft.  Musculoskeletal:        General: No tenderness or deformity. Normal range of motion.     Cervical back: Normal range of motion.  Lymphadenopathy:     Cervical: No cervical adenopathy.  Skin:    General: Skin is warm and dry.     Findings: No erythema or rash.  Neurological:     Mental Status: He is alert and oriented to person, place, and time.  Psychiatric:        Behavior: Behavior normal.        Thought Content: Thought content normal.        Judgment: Judgment normal.      Lab Results  Component Value Date   WBC 4.5 06/27/2023   HGB 13.3 06/27/2023   HCT 40.0 06/27/2023   MCV 95.2 06/27/2023   PLT 163 06/27/2023     Chemistry      Component Value Date/Time   NA 142 05/15/2023 0845   K 4.1 05/15/2023 0845   CL 109 05/15/2023 0845   CO2 27 05/15/2023 0845   BUN 27 (H) 05/15/2023 0845   CREATININE 0.88 05/15/2023 0845      Component Value Date/Time   CALCIUM 10.6 (H) 05/15/2023 0845   ALKPHOS 86 05/15/2023 0845   AST 8 (L) 05/15/2023 0845   ALT 11 05/15/2023 0845   BILITOT 0.4 05/15/2023 0845      Impression and Plan: [name] is a very nice 73 year old white male.  He has metastatic renal cell carcinoma.  Again, I am very happy that the PET scan did not show any active disease.  He really has done well on the nivolumab .  There is no need to  make any changes.  He has had no toxicity from the nivolumab .  We will plan for another follow-up in 6 weeks.     Ivor Mars, MD 5/29/20259:01 AM

## 2023-06-28 ENCOUNTER — Other Ambulatory Visit: Payer: Self-pay

## 2023-06-28 LAB — T4: T4, Total: 4.1 ug/dL — ABNORMAL LOW (ref 4.5–12.0)

## 2023-07-31 ENCOUNTER — Other Ambulatory Visit: Payer: Self-pay

## 2023-08-12 ENCOUNTER — Inpatient Hospital Stay (HOSPITAL_BASED_OUTPATIENT_CLINIC_OR_DEPARTMENT_OTHER): Admitting: Medical Oncology

## 2023-08-12 ENCOUNTER — Inpatient Hospital Stay: Attending: Hematology & Oncology

## 2023-08-12 ENCOUNTER — Encounter: Payer: Self-pay | Admitting: Medical Oncology

## 2023-08-12 ENCOUNTER — Inpatient Hospital Stay

## 2023-08-12 VITALS — BP 99/54 | HR 70 | Temp 98.3°F | Resp 18 | Ht 75.0 in | Wt 230.0 lb

## 2023-08-12 VITALS — BP 100/52 | HR 63 | Temp 98.1°F | Resp 20

## 2023-08-12 DIAGNOSIS — C641 Malignant neoplasm of right kidney, except renal pelvis: Secondary | ICD-10-CM | POA: Insufficient documentation

## 2023-08-12 DIAGNOSIS — Z79620 Long term (current) use of immunosuppressive biologic: Secondary | ICD-10-CM | POA: Insufficient documentation

## 2023-08-12 DIAGNOSIS — Z5112 Encounter for antineoplastic immunotherapy: Secondary | ICD-10-CM | POA: Diagnosis present

## 2023-08-12 DIAGNOSIS — C73 Malignant neoplasm of thyroid gland: Secondary | ICD-10-CM | POA: Diagnosis not present

## 2023-08-12 DIAGNOSIS — C7951 Secondary malignant neoplasm of bone: Secondary | ICD-10-CM | POA: Diagnosis present

## 2023-08-12 LAB — CMP (CANCER CENTER ONLY)
ALT: 17 U/L (ref 0–44)
AST: 10 U/L — ABNORMAL LOW (ref 15–41)
Albumin: 3.7 g/dL (ref 3.5–5.0)
Alkaline Phosphatase: 92 U/L (ref 38–126)
Anion gap: 6 (ref 5–15)
BUN: 26 mg/dL — ABNORMAL HIGH (ref 8–23)
CO2: 27 mmol/L (ref 22–32)
Calcium: 10.4 mg/dL — ABNORMAL HIGH (ref 8.9–10.3)
Chloride: 108 mmol/L (ref 98–111)
Creatinine: 0.91 mg/dL (ref 0.61–1.24)
GFR, Estimated: >60 mL/min (ref >60)
Glucose, Bld: 104 mg/dL — ABNORMAL HIGH (ref 70–99)
Potassium: 4.2 mmol/L (ref 3.5–5.1)
Sodium: 141 mmol/L (ref 135–145)
Total Bilirubin: 0.5 mg/dL (ref 0.0–1.2)
Total Protein: 5.7 g/dL — ABNORMAL LOW (ref 6.5–8.1)

## 2023-08-12 LAB — CBC WITH DIFFERENTIAL (CANCER CENTER ONLY)
Abs Immature Granulocytes: 0.01 K/uL (ref 0.00–0.07)
Basophils Absolute: 0 K/uL (ref 0.0–0.1)
Basophils Relative: 1 %
Eosinophils Absolute: 0.1 K/uL (ref 0.0–0.5)
Eosinophils Relative: 3 %
HCT: 41.3 % (ref 39.0–52.0)
Hemoglobin: 13.8 g/dL (ref 13.0–17.0)
Immature Granulocytes: 0 %
Lymphocytes Relative: 33 %
Lymphs Abs: 1.6 K/uL (ref 0.7–4.0)
MCH: 32.3 pg (ref 26.0–34.0)
MCHC: 33.4 g/dL (ref 30.0–36.0)
MCV: 96.7 fL (ref 80.0–100.0)
Monocytes Absolute: 0.5 K/uL (ref 0.1–1.0)
Monocytes Relative: 11 %
Neutro Abs: 2.5 K/uL (ref 1.7–7.7)
Neutrophils Relative %: 52 %
Platelet Count: 177 K/uL (ref 150–400)
RBC: 4.27 MIL/uL (ref 4.22–5.81)
RDW: 13.3 % (ref 11.5–15.5)
WBC Count: 4.8 K/uL (ref 4.0–10.5)
nRBC: 0 % (ref 0.0–0.2)

## 2023-08-12 LAB — TSH: TSH: 1.51 u[IU]/mL (ref 0.350–4.500)

## 2023-08-12 MED ORDER — SODIUM CHLORIDE 0.9 % IV SOLN
Freq: Once | INTRAVENOUS | Status: AC
Start: 2023-08-12 — End: 2023-08-12

## 2023-08-12 MED ORDER — SODIUM CHLORIDE 0.9 % IV SOLN
480.0000 mg | Freq: Once | INTRAVENOUS | Status: AC
  Administered 2023-08-12: 480 mg via INTRAVENOUS
  Filled 2023-08-12: qty 48

## 2023-08-12 NOTE — Progress Notes (Signed)
 [facility] Follow Up Visit  Francis Cunningham [id number] 06-29-1950 73 y.o. 08/12/2023   Principle Diagnosis:  Metastatic renal cell carcinoma-bone only metastasis  Current Therapy:   Maintenance nivolumab -Q 6-week dosing -- s/p cycle #27 --changed to 6-week dosing on 04/03/2022     Interim History:  Francis Cunningham is back for follow-up and consideration of treatment. He is accompanied by his wife.   Today he reports that he has been doing well. His only side effect of the treatment is fatigue which is managed by the prednisone .  Last PET scan in early April, showed that he had no evidence of active malignancy.  He has had no problems with pain.  Has had no problems with cough or shortness of breath.  He has had no bleeding.  Is no headache.  He has had no leg swelling.No rash, diarrhea, visual changes, neurological changes.   His last TSH was 1.5 on 06/27/2023  Overall, I would say that his performance status is probably ECOG 1.    Wt Readings from Last 3 Encounters:  06/27/23 227 lb (103 kg)  05/15/23 225 lb 6.4 oz (102.2 kg)  04/03/23 224 lb (101.6 kg)   Medications:  Current Outpatient Medications:    acetaminophen  (TYLENOL ) 325 MG tablet, Take 325 mg by mouth every 6 (six) hours as needed., Disp: , Rfl:    alfuzosin  (UROXATRAL ) 10 MG 24 hr tablet, Take 10 mg by mouth at bedtime., Disp: , Rfl:    ascorbic acid (VITAMIN C) 500 MG tablet, Take 500 mg by mouth in the morning., Disp: , Rfl:    Cholecalciferol 25 MCG (1000 UT) tablet, Take 2,000 Units by mouth daily., Disp: , Rfl:    escitalopram  (LEXAPRO ) 20 MG tablet, Take 20 mg by mouth daily., Disp: , Rfl:    finasteride  (PROSCAR ) 5 MG tablet, Take 5 mg by mouth daily., Disp: , Rfl:    guaiFENesin (MUCINEX) 600 MG 12 hr tablet, Take 600 mg by mouth in the morning and at bedtime., Disp: , Rfl:    Hypertonic Nasal Wash (SINUS RINSE) PACK, Irrigate with as directed., Disp: , Rfl:    LORazepam  (ATIVAN ) 1 MG  tablet, Take 2 tablets (2 mg total) by mouth as needed. Ona tablest as needed for procedures and imaging, Disp: 30 tablet, Rfl: 0   Magnesium Carbonate POWD, Take 325 mg by mouth daily. 1 1/2  teaspoons daily., Disp: , Rfl:    melatonin 3 MG TABS tablet, Take 3 mg by mouth at bedtime., Disp: , Rfl:    midodrine  (PROAMATINE ) 2.5 MG tablet, TAKE ONE (1) TABLET BY MOUTH 3 TIMES DAILY, Disp: 90 tablet, Rfl: 8   montelukast  (SINGULAIR ) 10 MG tablet, Take 1 tablet by mouth daily., Disp: , Rfl:    Multiple Vitamin (MULTI-VITAMINS) TABS, Take 1 tablet by mouth daily., Disp: , Rfl:    nivolumab  (OPDIVO ) 100 MG/10ML SOLN chemo injection, Inject into the vein., Disp: , Rfl:    Omega-3 Fatty Acids (FISH OIL PO), Take 5 mLs by mouth daily., Disp: , Rfl:    polyethylene glycol powder (GLYCOLAX /MIRALAX ) 17 GM/SCOOP powder, Take 17 g by mouth at bedtime., Disp: , Rfl:    Potassium Citrate-Citric Acid 3300-1002 MG PACK, Take by mouth daily., Disp: , Rfl:    predniSONE  (DELTASONE ) 10 MG tablet, Take 0.5 tablets (5 mg total) by mouth daily. (Patient taking differently: Take 10 mg by mouth daily.), Disp: 90 tablet, Rfl: 6   Probiotic Product (PROBIOTIC ACIDOPHILUS BEADS  PO), Take by mouth daily., Disp: , Rfl:    vitamin E 180 MG (400 UNITS) capsule, Take 400 Units by mouth every morning., Disp: , Rfl:   Current Facility-Administered Medications:    dexamethasone  (DECADRON ) injection 10 mg, 10 mg, Intravenous, Once,    dexamethasone  (DECADRON ) injection 10 mg, 10 mg, Intravenous, Once,   Allergies:  Allergies  Allergen Reactions   Morphine Nausea Only    Past Medical History, Surgical history, Social history, and Family History were reviewed and updated.  Review of Systems: Review of Systems  Constitutional: Negative.   HENT:   Negative for mouth sores.   Eyes: Negative.   Respiratory: Negative.    Cardiovascular: Negative.   Gastrointestinal: Negative.   Endocrine: Negative.   Genitourinary: Negative.     Musculoskeletal:  Positive for arthralgias and back pain.  Skin: Negative.   Neurological: Negative.   Hematological: Negative.   Psychiatric/Behavioral: Negative.      Physical Exam: Vitals:   08/12/23 0931  BP: (!) 99/54  Pulse: 70  Resp: 18  Temp: 98.3 F (36.8 C)  SpO2: 99%     Wt Readings from Last 3 Encounters:  06/27/23 227 lb (103 kg)  05/15/23 225 lb 6.4 oz (102.2 kg)  04/03/23 224 lb (101.6 kg)    Physical Exam Vitals reviewed.  HENT:     Head: Normocephalic and atraumatic.  Eyes:     Pupils: Pupils are equal, round, and reactive to light.  Cardiovascular:     Rate and Rhythm: Normal rate and regular rhythm.     Heart sounds: Normal heart sounds.  Pulmonary:     Effort: Pulmonary effort is normal.     Breath sounds: Normal breath sounds.  Abdominal:     General: Bowel sounds are normal.     Palpations: Abdomen is soft.  Musculoskeletal:        General: No tenderness or deformity. Normal range of motion.     Cervical back: Normal range of motion.  Lymphadenopathy:     Cervical: No cervical adenopathy.  Skin:    General: Skin is warm and dry.     Findings: No erythema or rash.  Neurological:     Mental Status: He is alert and oriented to person, place, and time.  Psychiatric:        Behavior: Behavior normal.        Thought Content: Thought content normal.        Judgment: Judgment normal.      Lab Results  Component Value Date   WBC 4.5 06/27/2023   HGB 13.3 06/27/2023   HCT 40.0 06/27/2023   MCV 95.2 06/27/2023   PLT 163 06/27/2023     Chemistry      Component Value Date/Time   NA 140 06/27/2023 0838   K 3.9 06/27/2023 0838   CL 108 06/27/2023 0838   CO2 27 06/27/2023 0838   BUN 25 (H) 06/27/2023 0838   CREATININE 0.90 06/27/2023 0838      Component Value Date/Time   CALCIUM 10.2 06/27/2023 0838   ALKPHOS 85 06/27/2023 0838   AST 10 (L) 06/27/2023 0838   ALT 14 06/27/2023 0838   BILITOT 0.5 06/27/2023 9161     Encounter  Diagnoses  Name Primary?   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) Yes   Malignant neoplasm of thyroid  gland (HCC)     Impression and Plan: Francis Cunningham is a very nice 73 year old white male.  He has  metastatic renal cell carcinoma.  Again, I am very happy that the PET scan did not show any active disease.  He really has done well on the nivolumab .  There is no need to make any changes.  He has had no toxicity from the nivolumab . CBC and CMP stable and acceptable for TX today   RTC 6 weeks (on a Wednesday per Pt request) MD, labs, Opdivo     Lauraine CHRISTELLA Dais, PA-C 7/14/20259:30 AM

## 2023-08-12 NOTE — Patient Instructions (Signed)
 [facility]  Discharge Instructions: Thank you for choosing [facility] to provide your oncology and hematology care.   If you have a lab appointment with the Cancer Center, please go directly to the Cancer Center and check in at the registration area.  Wear comfortable clothing and clothing appropriate for easy access to any Portacath or PICC line.   We strive to give you quality time with your provider. You may need to reschedule your appointment if you arrive late (15 or more minutes).  Arriving late affects you and other patients whose appointments are after yours.  Also, if you miss three or more appointments without notifying the office, you may be dismissed from the clinic at the provider's discretion.      For prescription refill requests, have your pharmacy contact our office and allow 72 hours for refills to be completed.    Today you received the following chemotherapy and/or immunotherapy agents Nivolumab    To help prevent nausea and vomiting after your treatment, we encourage you to take your nausea medication as directed.  BELOW ARE SYMPTOMS THAT SHOULD BE REPORTED IMMEDIATELY: *FEVER GREATER THAN 100.4 F (38 C) OR HIGHER *CHILLS OR SWEATING *NAUSEA AND VOMITING THAT IS NOT CONTROLLED WITH YOUR NAUSEA MEDICATION *UNUSUAL SHORTNESS OF BREATH *UNUSUAL BRUISING OR BLEEDING *URINARY PROBLEMS (pain or burning when urinating, or frequent urination) *BOWEL PROBLEMS (unusual diarrhea, constipation, pain near the anus) TENDERNESS IN MOUTH AND THROAT WITH OR WITHOUT PRESENCE OF ULCERS (sore throat, sores in mouth, or a toothache) UNUSUAL RASH, SWELLING OR PAIN  UNUSUAL VAGINAL DISCHARGE OR ITCHING   Items with * indicate a potential emergency and should be followed up as soon as possible or go to the Emergency Department if any problems should occur.  Please show the CHEMOTHERAPY ALERT CARD or IMMUNOTHERAPY  ALERT CARD at check-in to the Emergency Department and triage nurse. Should you have questions after your visit or need to cancel or reschedule your appointment, please contact [facility] - A DEPT OF [facility]  [phone] and follow the prompts.  Office hours are 8:00 a.m. to 4:30 p.m. Monday - Friday. Please note that voicemails left after 4:00 p.m. may not be returned until the following business day.  We are closed weekends and major holidays. You have access to a nurse at all times for urgent questions. Please call the main number to the clinic [phone] and follow the prompts.  For any non-urgent questions, you may also contact your provider using MyChart. We now offer e-Visits for anyone 14 and older to request care online for non-urgent symptoms. For details visit [email or web address].   Also download the MyChart app! Go to the app store, search "MyChart", open the app, select [place], and log in with your MyChart username and password.

## 2023-08-13 ENCOUNTER — Other Ambulatory Visit: Payer: Self-pay

## 2023-08-13 LAB — T4: T4, Total: 4.1 ug/dL — ABNORMAL LOW (ref 4.5–12.0)

## 2023-08-19 ENCOUNTER — Other Ambulatory Visit: Payer: Self-pay

## 2023-08-26 ENCOUNTER — Other Ambulatory Visit: Payer: Self-pay

## 2023-09-16 ENCOUNTER — Encounter (HOSPITAL_COMMUNITY)
Admission: RE | Admit: 2023-09-16 | Discharge: 2023-09-16 | Disposition: A | Discharge status: Home / Self Care | Source: Ambulatory Visit | Attending: Medical Oncology | Admitting: Medical Oncology

## 2023-09-16 DIAGNOSIS — C73 Malignant neoplasm of thyroid gland: Secondary | ICD-10-CM | POA: Diagnosis present

## 2023-09-16 DIAGNOSIS — C641 Malignant neoplasm of right kidney, except renal pelvis: Secondary | ICD-10-CM | POA: Insufficient documentation

## 2023-09-16 LAB — GLUCOSE, CAPILLARY: Glucose-Capillary: 91 mg/dL (ref 70–99)

## 2023-09-16 MED ORDER — FLUDEOXYGLUCOSE F - 18 (FDG) INJECTION
11.5000 | Freq: Once | INTRAVENOUS | Status: AC
Start: 2023-09-16 — End: 2023-09-16
  Administered 2023-09-16: 11.5 via INTRAVENOUS

## 2023-09-25 ENCOUNTER — Inpatient Hospital Stay (HOSPITAL_BASED_OUTPATIENT_CLINIC_OR_DEPARTMENT_OTHER): Admitting: Hematology & Oncology

## 2023-09-25 ENCOUNTER — Inpatient Hospital Stay: Attending: Hematology & Oncology

## 2023-09-25 ENCOUNTER — Inpatient Hospital Stay

## 2023-09-25 VITALS — BP 90/57 | HR 59

## 2023-09-25 VITALS — BP 81/57 | HR 75 | Temp 97.8°F | Ht 75.0 in | Wt 230.8 lb

## 2023-09-25 DIAGNOSIS — C7951 Secondary malignant neoplasm of bone: Secondary | ICD-10-CM | POA: Insufficient documentation

## 2023-09-25 DIAGNOSIS — M816 Localized osteoporosis [Lequesne]: Secondary | ICD-10-CM | POA: Diagnosis not present

## 2023-09-25 DIAGNOSIS — Z79620 Long term (current) use of immunosuppressive biologic: Secondary | ICD-10-CM | POA: Insufficient documentation

## 2023-09-25 DIAGNOSIS — Z5112 Encounter for antineoplastic immunotherapy: Secondary | ICD-10-CM | POA: Diagnosis present

## 2023-09-25 DIAGNOSIS — E041 Nontoxic single thyroid nodule: Secondary | ICD-10-CM | POA: Insufficient documentation

## 2023-09-25 DIAGNOSIS — C641 Malignant neoplasm of right kidney, except renal pelvis: Secondary | ICD-10-CM | POA: Insufficient documentation

## 2023-09-25 LAB — CMP (CANCER CENTER ONLY)
ALT: 23 U/L (ref 0–44)
AST: 14 U/L — ABNORMAL LOW (ref 15–41)
Albumin: 3.8 g/dL (ref 3.5–5.0)
Alkaline Phosphatase: 106 U/L (ref 38–126)
Anion gap: 9 (ref 5–15)
BUN: 25 mg/dL — ABNORMAL HIGH (ref 8–23)
CO2: 24 mmol/L (ref 22–32)
Calcium: 10.7 mg/dL — ABNORMAL HIGH (ref 8.9–10.3)
Chloride: 109 mmol/L (ref 98–111)
Creatinine: 0.92 mg/dL (ref 0.61–1.24)
GFR, Estimated: >60 mL/min (ref >60)
Glucose, Bld: 87 mg/dL (ref 70–99)
Potassium: 3.9 mmol/L (ref 3.5–5.1)
Sodium: 142 mmol/L (ref 135–145)
Total Bilirubin: 0.3 mg/dL (ref 0.0–1.2)
Total Protein: 5.9 g/dL — ABNORMAL LOW (ref 6.5–8.1)

## 2023-09-25 LAB — CBC WITH DIFFERENTIAL (CANCER CENTER ONLY)
Abs Immature Granulocytes: 0 K/uL (ref 0.00–0.07)
Basophils Absolute: 0 K/uL (ref 0.0–0.1)
Basophils Relative: 1 %
Eosinophils Absolute: 0.1 K/uL (ref 0.0–0.5)
Eosinophils Relative: 3 %
HCT: 41.2 % (ref 39.0–52.0)
Hemoglobin: 13.9 g/dL (ref 13.0–17.0)
Immature Granulocytes: 0 %
Lymphocytes Relative: 36 %
Lymphs Abs: 1.6 K/uL (ref 0.7–4.0)
MCH: 32.3 pg (ref 26.0–34.0)
MCHC: 33.7 g/dL (ref 30.0–36.0)
MCV: 95.6 fL (ref 80.0–100.0)
Monocytes Absolute: 0.6 K/uL (ref 0.1–1.0)
Monocytes Relative: 13 %
Neutro Abs: 2.2 K/uL (ref 1.7–7.7)
Neutrophils Relative %: 47 %
Platelet Count: 152 K/uL (ref 150–400)
RBC: 4.31 MIL/uL (ref 4.22–5.81)
RDW: 12.9 % (ref 11.5–15.5)
WBC Count: 4.6 K/uL (ref 4.0–10.5)
nRBC: 0 % (ref 0.0–0.2)

## 2023-09-25 LAB — TSH: TSH: 1.67 u[IU]/mL (ref 0.350–4.500)

## 2023-09-25 MED ORDER — SODIUM CHLORIDE 0.9 % IV SOLN: Freq: Once | INTRAVENOUS | Status: AC

## 2023-09-25 MED ORDER — SODIUM CHLORIDE 0.9 % IV SOLN
480.0000 mg | Freq: Once | INTRAVENOUS | Status: AC
  Administered 2023-09-25: 480 mg via INTRAVENOUS
  Filled 2023-09-25: qty 48

## 2023-09-25 NOTE — Progress Notes (Signed)
 [facility] Follow Up Visit  Francis Cunningham [id number] [birth year] 73 y.o. 09/25/2023   Principle Diagnosis:  Metastatic renal cell carcinoma-bone only metastasis  Current Therapy:   Maintenance nivolumab -Q 6-week dosing -- s/p cycle #26 --changed to 6-week dosing on 04/03/2022     Interim History:  Francis Cunningham is back for follow-up.  As always, he comes in with his wife.  We always have a very nice conversation.  They are looking forward to Labor Day weekend.  He feels well.  He did have a PET scan that was done.  The PET scan not show any evidence of recurrence of his renal cell carcinoma.  They did mention that there was some activity in a thyroid  nodule.  I am not sure exactly what this indicates.  He is active.  He is eating well.  He is having no problems with nausea or vomiting.  He is having no problems with cough or shortness of breath.  He has had no change in bowel or bladder habits.  His last TSH back in July was 1.5.  Overall, I would say that his performance status is ECOG 0.  Medications:  Current Outpatient Medications:    acetaminophen  (TYLENOL ) 325 MG tablet, Take 325 mg by mouth every 6 (six) hours as needed., Disp: , Rfl:    alfuzosin  (UROXATRAL ) 10 MG 24 hr tablet, Take 10 mg by mouth at bedtime., Disp: , Rfl:    ascorbic acid (VITAMIN C) 500 MG tablet, Take 500 mg by mouth in the morning., Disp: , Rfl:    Cholecalciferol 25 MCG (1000 UT) tablet, Take 2,000 Units by mouth daily., Disp: , Rfl:    escitalopram  (LEXAPRO ) 20 MG tablet, Take 20 mg by mouth daily., Disp: , Rfl:    finasteride  (PROSCAR ) 5 MG tablet, Take 5 mg by mouth daily., Disp: , Rfl:    guaiFENesin (MUCINEX) 600 MG 12 hr tablet, Take 600 mg by mouth in the morning and at bedtime., Disp: , Rfl:    Hypertonic Nasal Wash (SINUS RINSE) PACK, Irrigate with as directed., Disp: , Rfl:    LORazepam  (ATIVAN ) 1 MG tablet, Take 2 tablets (2 mg total) by mouth as needed. Ona tablest as  needed for procedures and imaging, Disp: 30 tablet, Rfl: 0   Magnesium Carbonate POWD, Take 325 mg by mouth daily. 1 1/2  teaspoons daily., Disp: , Rfl:    melatonin 3 MG TABS tablet, Take 3 mg by mouth at bedtime., Disp: , Rfl:    midodrine  (PROAMATINE ) 2.5 MG tablet, TAKE ONE (1) TABLET BY MOUTH 3 TIMES DAILY, Disp: 90 tablet, Rfl: 8   montelukast  (SINGULAIR ) 10 MG tablet, Take 1 tablet by mouth daily., Disp: , Rfl:    Multiple Vitamin (MULTI-VITAMINS) TABS, Take 1 tablet by mouth daily., Disp: , Rfl:    nivolumab  (OPDIVO ) 100 MG/10ML SOLN chemo injection, Inject into the vein., Disp: , Rfl:    Omega-3 Fatty Acids (FISH OIL PO), Take 5 mLs by mouth daily., Disp: , Rfl:    polyethylene glycol powder (GLYCOLAX /MIRALAX ) 17 GM/SCOOP powder, Take 17 g by mouth at bedtime., Disp: , Rfl:    Potassium Citrate-Citric Acid 3300-1002 MG PACK, Take by mouth daily., Disp: , Rfl:    predniSONE  (DELTASONE ) 10 MG tablet, Take 0.5 tablets (5 mg total) by mouth daily. (Patient taking differently: Take 10 mg by mouth daily.), Disp: 90 tablet, Rfl: 6   Probiotic Product (PROBIOTIC ACIDOPHILUS BEADS PO), Take by mouth daily., Disp: ,  Rfl:    vitamin E 180 MG (400 UNITS) capsule, Take 400 Units by mouth every morning., Disp: , Rfl:   Allergies:  Allergies  Allergen Reactions   Morphine Nausea Only   Xgeva [Denosumab] Other (See Comments)    Necrosis of the jaw, mouth sores    Past Medical History, Surgical history, Social history, and Family History were reviewed and updated.  Review of Systems: Review of Systems  Constitutional: Negative.   HENT:   Positive for mouth sores.   Eyes: Negative.   Respiratory: Negative.    Cardiovascular: Negative.   Gastrointestinal: Negative.   Endocrine: Negative.   Genitourinary: Negative.    Musculoskeletal:  Positive for arthralgias and back pain.  Skin: Negative.   Neurological: Negative.   Hematological: Negative.   Psychiatric/Behavioral: Negative.       Physical Exam: Temperature is 97.8.  Pulse 75.  Blood pressure 81/57.  Weight is 230 pounds.     Wt Readings from Last 3 Encounters:  09/25/23 230 lb 12.8 oz (104.7 kg)  08/12/23 230 lb (104.3 kg)  06/27/23 227 lb (103 kg)    Physical Exam Vitals reviewed.  HENT:     Head: Normocephalic and atraumatic.  Eyes:     Pupils: Pupils are equal, round, and reactive to light.  Cardiovascular:     Rate and Rhythm: Normal rate and regular rhythm.     Heart sounds: Normal heart sounds.  Pulmonary:     Effort: Pulmonary effort is normal.     Breath sounds: Normal breath sounds.  Abdominal:     General: Bowel sounds are normal.     Palpations: Abdomen is soft.  Musculoskeletal:        General: No tenderness or deformity. Normal range of motion.     Cervical back: Normal range of motion.  Lymphadenopathy:     Cervical: No cervical adenopathy.  Skin:    General: Skin is warm and dry.     Findings: No erythema or rash.  Neurological:     Mental Status: He is alert and oriented to person, place, and time.  Psychiatric:        Behavior: Behavior normal.        Thought Content: Thought content normal.        Judgment: Judgment normal.      Lab Results  Component Value Date   WBC 4.6 09/25/2023   HGB 13.9 09/25/2023   HCT 41.2 09/25/2023   MCV 95.6 09/25/2023   PLT 152 09/25/2023     Chemistry      Component Value Date/Time   NA 141 08/12/2023 0907   K 4.2 08/12/2023 0907   CL 108 08/12/2023 0907   CO2 27 08/12/2023 0907   BUN 26 (H) 08/12/2023 0907   CREATININE 0.91 08/12/2023 0907      Component Value Date/Time   CALCIUM 10.4 (H) 08/12/2023 0907   ALKPHOS 92 08/12/2023 0907   AST 10 (L) 08/12/2023 0907   ALT 17 08/12/2023 0907   BILITOT 0.5 08/12/2023 0907      Impression and Plan: Francis Cunningham is a very nice 73 year old white male.  He has metastatic renal cell carcinoma.  Again, I am very happy that the PET scan did not show any active disease.  He  really has done well on the nivolumab .  There is no need to make any changes.  He has had no toxicity from the nivolumab .  I will go ahead and order a thyroid  ultrasound just  to make sure that everything is okay with the thyroid  nodule that is noted on the right lobe.  We will plan for another follow-up in 6 weeks.     Maude JONELLE Crease, MD 8/27/20258:46 AM

## 2023-09-25 NOTE — Patient Instructions (Signed)
 [facility]  Discharge Instructions: Thank you for choosing [facility] to provide your oncology and hematology care.   If you have a lab appointment with the Cancer Center, please go directly to the Cancer Center and check in at the registration area.  Wear comfortable clothing and clothing appropriate for easy access to any Portacath or PICC line.   We strive to give you quality time with your provider. You may need to reschedule your appointment if you arrive late (15 or more minutes).  Arriving late affects you and other patients whose appointments are after yours.  Also, if you miss three or more appointments without notifying the office, you may be dismissed from the clinic at the provider's discretion.      For prescription refill requests, have your pharmacy contact our office and allow 72 hours for refills to be completed.    Today you received the following chemotherapy and/or immunotherapy agents Nivolumab    To help prevent nausea and vomiting after your treatment, we encourage you to take your nausea medication as directed.  BELOW ARE SYMPTOMS THAT SHOULD BE REPORTED IMMEDIATELY: *FEVER GREATER THAN 100.4 F (38 C) OR HIGHER *CHILLS OR SWEATING *NAUSEA AND VOMITING THAT IS NOT CONTROLLED WITH YOUR NAUSEA MEDICATION *UNUSUAL SHORTNESS OF BREATH *UNUSUAL BRUISING OR BLEEDING *URINARY PROBLEMS (pain or burning when urinating, or frequent urination) *BOWEL PROBLEMS (unusual diarrhea, constipation, pain near the anus) TENDERNESS IN MOUTH AND THROAT WITH OR WITHOUT PRESENCE OF ULCERS (sore throat, sores in mouth, or a toothache) UNUSUAL RASH, SWELLING OR PAIN  UNUSUAL VAGINAL DISCHARGE OR ITCHING   Items with * indicate a potential emergency and should be followed up as soon as possible or go to the Emergency Department if any problems should occur.  Please show the CHEMOTHERAPY ALERT CARD or IMMUNOTHERAPY  ALERT CARD at check-in to the Emergency Department and triage nurse. Should you have questions after your visit or need to cancel or reschedule your appointment, please contact [facility] - A DEPT OF [facility]  [phone] and follow the prompts.  Office hours are 8:00 a.m. to 4:30 p.m. Monday - Friday. Please note that voicemails left after 4:00 p.m. may not be returned until the following business day.  We are closed weekends and major holidays. You have access to a nurse at all times for urgent questions. Please call the main number to the clinic [phone] and follow the prompts.  For any non-urgent questions, you may also contact your provider using MyChart. We now offer e-Visits for anyone 14 and older to request care online for non-urgent symptoms. For details visit [email or web address].   Also download the MyChart app! Go to the app store, search "MyChart", open the app, select [place], and log in with your MyChart username and password.

## 2023-09-26 ENCOUNTER — Other Ambulatory Visit: Payer: Self-pay

## 2023-09-26 LAB — T4: T4, Total: 4.3 ug/dL — ABNORMAL LOW (ref 4.5–12.0)

## 2023-10-04 ENCOUNTER — Ambulatory Visit (HOSPITAL_BASED_OUTPATIENT_CLINIC_OR_DEPARTMENT_OTHER)
Admission: RE | Admit: 2023-10-04 | Discharge: 2023-10-04 | Disposition: A | Discharge status: Home / Self Care | Source: Ambulatory Visit | Attending: Hematology & Oncology | Admitting: Hematology & Oncology

## 2023-10-04 DIAGNOSIS — E041 Nontoxic single thyroid nodule: Secondary | ICD-10-CM | POA: Diagnosis present

## 2023-10-10 ENCOUNTER — Ambulatory Visit: Payer: Self-pay | Admitting: Hematology & Oncology

## 2023-11-07 ENCOUNTER — Inpatient Hospital Stay

## 2023-11-07 ENCOUNTER — Inpatient Hospital Stay: Attending: Hematology & Oncology

## 2023-11-07 ENCOUNTER — Encounter: Payer: Self-pay | Admitting: Hematology & Oncology

## 2023-11-07 ENCOUNTER — Inpatient Hospital Stay (HOSPITAL_BASED_OUTPATIENT_CLINIC_OR_DEPARTMENT_OTHER): Admitting: Hematology & Oncology

## 2023-11-07 VITALS — BP 102/60 | HR 76 | Temp 97.8°F | Resp 18 | Ht 75.0 in | Wt 230.4 lb

## 2023-11-07 VITALS — BP 105/69 | HR 60 | Resp 20

## 2023-11-07 DIAGNOSIS — Z79620 Long term (current) use of immunosuppressive biologic: Secondary | ICD-10-CM | POA: Diagnosis not present

## 2023-11-07 DIAGNOSIS — C641 Malignant neoplasm of right kidney, except renal pelvis: Secondary | ICD-10-CM | POA: Insufficient documentation

## 2023-11-07 DIAGNOSIS — Z5112 Encounter for antineoplastic immunotherapy: Secondary | ICD-10-CM | POA: Diagnosis present

## 2023-11-07 DIAGNOSIS — I161 Hypertensive emergency: Secondary | ICD-10-CM

## 2023-11-07 DIAGNOSIS — E041 Nontoxic single thyroid nodule: Secondary | ICD-10-CM | POA: Diagnosis not present

## 2023-11-07 DIAGNOSIS — C7951 Secondary malignant neoplasm of bone: Secondary | ICD-10-CM | POA: Diagnosis present

## 2023-11-07 LAB — CMP (CANCER CENTER ONLY)
ALT: 22 U/L (ref 0–44)
AST: 14 U/L — ABNORMAL LOW (ref 15–41)
Albumin: 3.9 g/dL (ref 3.5–5.0)
Alkaline Phosphatase: 105 U/L (ref 38–126)
Anion gap: 10 (ref 5–15)
BUN: 24 mg/dL — ABNORMAL HIGH (ref 8–23)
CO2: 22 mmol/L (ref 22–32)
Calcium: 10.6 mg/dL — ABNORMAL HIGH (ref 8.9–10.3)
Chloride: 109 mmol/L (ref 98–111)
Creatinine: 0.87 mg/dL (ref 0.61–1.24)
GFR, Estimated: >60 mL/min (ref >60)
Glucose, Bld: 99 mg/dL (ref 70–99)
Potassium: 4 mmol/L (ref 3.5–5.1)
Sodium: 142 mmol/L (ref 135–145)
Total Bilirubin: 0.4 mg/dL (ref 0.0–1.2)
Total Protein: 6 g/dL — ABNORMAL LOW (ref 6.5–8.1)

## 2023-11-07 LAB — CBC WITH DIFFERENTIAL (CANCER CENTER ONLY)
Abs Immature Granulocytes: 0.01 K/uL (ref 0.00–0.07)
Basophils Absolute: 0 K/uL (ref 0.0–0.1)
Basophils Relative: 1 %
Eosinophils Absolute: 0.1 K/uL (ref 0.0–0.5)
Eosinophils Relative: 2 %
HCT: 41.5 % (ref 39.0–52.0)
Hemoglobin: 14 g/dL (ref 13.0–17.0)
Immature Granulocytes: 0 %
Lymphocytes Relative: 36 %
Lymphs Abs: 1.6 K/uL (ref 0.7–4.0)
MCH: 32.3 pg (ref 26.0–34.0)
MCHC: 33.7 g/dL (ref 30.0–36.0)
MCV: 95.8 fL (ref 80.0–100.0)
Monocytes Absolute: 0.6 K/uL (ref 0.1–1.0)
Monocytes Relative: 13 %
Neutro Abs: 2.2 K/uL (ref 1.7–7.7)
Neutrophils Relative %: 48 %
Platelet Count: 167 K/uL (ref 150–400)
RBC: 4.33 MIL/uL (ref 4.22–5.81)
RDW: 12.6 % (ref 11.5–15.5)
WBC Count: 4.5 K/uL (ref 4.0–10.5)
nRBC: 0 % (ref 0.0–0.2)

## 2023-11-07 LAB — TSH: TSH: 1.33 u[IU]/mL (ref 0.350–4.500)

## 2023-11-07 MED ORDER — SODIUM CHLORIDE 0.9 % IV SOLN: Freq: Once | INTRAVENOUS | Status: AC

## 2023-11-07 MED ORDER — SODIUM CHLORIDE 0.9 % IV SOLN
480.0000 mg | Freq: Once | INTRAVENOUS | Status: AC
  Administered 2023-11-07: 480 mg via INTRAVENOUS
  Filled 2023-11-07: qty 48

## 2023-11-07 NOTE — Patient Instructions (Signed)
 [facility]  Discharge Instructions: Thank you for choosing [facility] to provide your oncology and hematology care.   If you have a lab appointment with the Cancer Center, please go directly to the Cancer Center and check in at the registration area.  Wear comfortable clothing and clothing appropriate for easy access to any Portacath or PICC line.   We strive to give you quality time with your provider. You may need to reschedule your appointment if you arrive late (15 or more minutes).  Arriving late affects you and other patients whose appointments are after yours.  Also, if you miss three or more appointments without notifying the office, you may be dismissed from the clinic at the provider's discretion.      For prescription refill requests, have your pharmacy contact our office and allow 72 hours for refills to be completed.    Today you received the following chemotherapy and/or immunotherapy agents Nivolumab    To help prevent nausea and vomiting after your treatment, we encourage you to take your nausea medication as directed.  BELOW ARE SYMPTOMS THAT SHOULD BE REPORTED IMMEDIATELY: *FEVER GREATER THAN 100.4 F (38 C) OR HIGHER *CHILLS OR SWEATING *NAUSEA AND VOMITING THAT IS NOT CONTROLLED WITH YOUR NAUSEA MEDICATION *UNUSUAL SHORTNESS OF BREATH *UNUSUAL BRUISING OR BLEEDING *URINARY PROBLEMS (pain or burning when urinating, or frequent urination) *BOWEL PROBLEMS (unusual diarrhea, constipation, pain near the anus) TENDERNESS IN MOUTH AND THROAT WITH OR WITHOUT PRESENCE OF ULCERS (sore throat, sores in mouth, or a toothache) UNUSUAL RASH, SWELLING OR PAIN  UNUSUAL VAGINAL DISCHARGE OR ITCHING   Items with * indicate a potential emergency and should be followed up as soon as possible or go to the Emergency Department if any problems should occur.  Please show the CHEMOTHERAPY ALERT CARD or IMMUNOTHERAPY  ALERT CARD at check-in to the Emergency Department and triage nurse. Should you have questions after your visit or need to cancel or reschedule your appointment, please contact [facility] - A DEPT OF [facility]  [phone] and follow the prompts.  Office hours are 8:00 a.m. to 4:30 p.m. Monday - Friday. Please note that voicemails left after 4:00 p.m. may not be returned until the following business day.  We are closed weekends and major holidays. You have access to a nurse at all times for urgent questions. Please call the main number to the clinic [phone] and follow the prompts.  For any non-urgent questions, you may also contact your provider using MyChart. We now offer e-Visits for anyone 14 and older to request care online for non-urgent symptoms. For details visit [email or web address].   Also download the MyChart app! Go to the app store, search "MyChart", open the app, select [place], and log in with your MyChart username and password.

## 2023-11-07 NOTE — Progress Notes (Signed)
 [facility] Follow Up Visit  Francis Cunningham [id number] Oct 30, 1950 73 y.o. 11/07/2023   Principle Diagnosis:  Metastatic renal cell carcinoma-bone only metastasis  Current Therapy:   Maintenance nivolumab -Q 6-week dosing -- s/p cycle #27 --changed to 6-week dosing on 04/03/2022     Interim History:  Francis Cunningham is back for follow-up.  As always, he comes in with his wife.  We always have a very nice conversation.  He really looks great.  He is doing physical therapy.  This is really helping him out.  He has had no complaints.  He has had no cough or shortness of breath.  He has had no headache.  He has had no nausea or vomiting.  There is been no change in bowel or bladder habits.  He has had no rashes.  There is been a little bit of leg swelling but this is chronic.  His last TSH back in August was 1.7.  He has had a good appetite.  He had a thyroid  ultrasound done on 10/04/2023.  This did not show anything that was suspicious for any nodules.  Overall, his performance status is probably ECOG 0.  Medications:  Current Outpatient Medications:    acetaminophen  (TYLENOL ) 325 MG tablet, Take 325 mg by mouth every 6 (six) hours as needed., Disp: , Rfl:    alfuzosin  (UROXATRAL ) 10 MG 24 hr tablet, Take 10 mg by mouth at bedtime., Disp: , Rfl:    ascorbic acid (VITAMIN C) 500 MG tablet, Take 500 mg by mouth in the morning., Disp: , Rfl:    Cholecalciferol 25 MCG (1000 UT) tablet, Take 2,000 Units by mouth daily., Disp: , Rfl:    escitalopram  (LEXAPRO ) 20 MG tablet, Take 20 mg by mouth daily., Disp: , Rfl:    finasteride  (PROSCAR ) 5 MG tablet, Take 5 mg by mouth daily., Disp: , Rfl:    guaiFENesin (MUCINEX) 600 MG 12 hr tablet, Take 600 mg by mouth in the morning and at bedtime., Disp: , Rfl:    Hypertonic Nasal Wash (SINUS RINSE) PACK, Irrigate with as directed., Disp: , Rfl:    LORazepam  (ATIVAN ) 1 MG tablet, Take 2 tablets (2 mg total) by mouth as needed. Ona  tablest as needed for procedures and imaging, Disp: 30 tablet, Rfl: 0   Magnesium Carbonate POWD, Take 325 mg by mouth daily. 1 1/2  teaspoons daily., Disp: , Rfl:    melatonin 3 MG TABS tablet, Take 3 mg by mouth at bedtime., Disp: , Rfl:    midodrine  (PROAMATINE ) 2.5 MG tablet, TAKE ONE (1) TABLET BY MOUTH 3 TIMES DAILY, Disp: 90 tablet, Rfl: 8   montelukast  (SINGULAIR ) 10 MG tablet, Take 1 tablet by mouth daily., Disp: , Rfl:    Multiple Vitamin (MULTI-VITAMINS) TABS, Take 1 tablet by mouth daily., Disp: , Rfl:    nivolumab  (OPDIVO ) 100 MG/10ML SOLN chemo injection, Inject into the vein., Disp: , Rfl:    Omega-3 Fatty Acids (FISH OIL PO), Take 5 mLs by mouth daily., Disp: , Rfl:    polyethylene glycol powder (GLYCOLAX /MIRALAX ) 17 GM/SCOOP powder, Take 17 g by mouth at bedtime., Disp: , Rfl:    Potassium Citrate-Citric Acid 3300-1002 MG PACK, Take by mouth daily., Disp: , Rfl:    predniSONE  (DELTASONE ) 10 MG tablet, Take 0.5 tablets (5 mg total) by mouth daily., Disp: 90 tablet, Rfl: 6   Probiotic Product (PROBIOTIC ACIDOPHILUS BEADS PO), Take by mouth daily., Disp: , Rfl:    vitamin E  180 MG (400 UNITS) capsule, Take 400 Units by mouth every morning., Disp: , Rfl:   Allergies:  Allergies  Allergen Reactions   Morphine Nausea Only   Xgeva [Denosumab] Other (See Comments)    Necrosis of the jaw, mouth sores    Past Medical History, Surgical history, Social history, and Family History were reviewed and updated.  Review of Systems: Review of Systems  Constitutional: Negative.   HENT:   Positive for mouth sores.   Eyes: Negative.   Respiratory: Negative.    Cardiovascular: Negative.   Gastrointestinal: Negative.   Endocrine: Negative.   Genitourinary: Negative.    Musculoskeletal:  Positive for arthralgias and back pain.  Skin: Negative.   Neurological: Negative.   Hematological: Negative.   Psychiatric/Behavioral: Negative.      Physical Exam: Temperature is 97.8.  Pulse 76.   Blood pressure 112/60.  Weight is 230 pounds.       Wt Readings from Last 3 Encounters:  11/07/23 230 lb 7 oz (104.5 kg)  09/25/23 230 lb 12.8 oz (104.7 kg)  08/12/23 230 lb (104.3 kg)    Physical Exam Vitals reviewed.  HENT:     Head: Normocephalic and atraumatic.  Eyes:     Pupils: Pupils are equal, round, and reactive to light.  Cardiovascular:     Rate and Rhythm: Normal rate and regular rhythm.     Heart sounds: Normal heart sounds.  Pulmonary:     Effort: Pulmonary effort is normal.     Breath sounds: Normal breath sounds.  Abdominal:     General: Bowel sounds are normal.     Palpations: Abdomen is soft.  Musculoskeletal:        General: No tenderness or deformity. Normal range of motion.     Cervical back: Normal range of motion.  Lymphadenopathy:     Cervical: No cervical adenopathy.  Skin:    General: Skin is warm and dry.     Findings: No erythema or rash.  Neurological:     Mental Status: He is alert and oriented to person, place, and time.  Psychiatric:        Behavior: Behavior normal.        Thought Content: Thought content normal.        Judgment: Judgment normal.      Lab Results  Component Value Date   WBC 4.5 11/07/2023   HGB 14.0 11/07/2023   HCT 41.5 11/07/2023   MCV 95.8 11/07/2023   PLT 167 11/07/2023     Chemistry      Component Value Date/Time   NA 142 09/25/2023 0814   K 3.9 09/25/2023 0814   CL 109 09/25/2023 0814   CO2 24 09/25/2023 0814   BUN 25 (H) 09/25/2023 0814   CREATININE 0.92 09/25/2023 0814      Component Value Date/Time   CALCIUM 10.7 (H) 09/25/2023 0814   ALKPHOS 106 09/25/2023 0814   AST 14 (L) 09/25/2023 0814   ALT 23 09/25/2023 0814   BILITOT 0.3 09/25/2023 0814      Impression and Plan: Francis Cunningham is a very nice 73 year old white male.  He has metastatic renal cell carcinoma.  Again, I am very happy that the last PET scan did not show any active disease.  For right now, I do not think we have to do  another PET scan probably until December.  Will see about get this set up for him.  I will go ahead and get him back in 6  weeks.  I think that the next PET scan looks fine, then we will see about moving his appointments out to every 2 months.   10/9/20258:23 AM

## 2023-11-08 ENCOUNTER — Other Ambulatory Visit: Payer: Self-pay

## 2023-11-08 LAB — T4: T4, Total: 4.5 ug/dL (ref 4.5–12.0)

## 2023-12-18 ENCOUNTER — Inpatient Hospital Stay (HOSPITAL_BASED_OUTPATIENT_CLINIC_OR_DEPARTMENT_OTHER): Admitting: Hematology & Oncology

## 2023-12-18 ENCOUNTER — Inpatient Hospital Stay

## 2023-12-18 ENCOUNTER — Encounter: Payer: Self-pay | Admitting: Hematology & Oncology

## 2023-12-18 ENCOUNTER — Inpatient Hospital Stay: Attending: Hematology & Oncology

## 2023-12-18 VITALS — BP 99/75 | HR 66 | Resp 18

## 2023-12-18 VITALS — BP 96/57 | HR 74 | Temp 98.2°F | Resp 18 | Ht 75.0 in | Wt 230.0 lb

## 2023-12-18 DIAGNOSIS — Z5112 Encounter for antineoplastic immunotherapy: Secondary | ICD-10-CM | POA: Insufficient documentation

## 2023-12-18 DIAGNOSIS — R112 Nausea with vomiting, unspecified: Secondary | ICD-10-CM | POA: Insufficient documentation

## 2023-12-18 DIAGNOSIS — Z79620 Long term (current) use of immunosuppressive biologic: Secondary | ICD-10-CM | POA: Diagnosis not present

## 2023-12-18 DIAGNOSIS — C7951 Secondary malignant neoplasm of bone: Secondary | ICD-10-CM | POA: Insufficient documentation

## 2023-12-18 DIAGNOSIS — C641 Malignant neoplasm of right kidney, except renal pelvis: Secondary | ICD-10-CM | POA: Diagnosis present

## 2023-12-18 DIAGNOSIS — E041 Nontoxic single thyroid nodule: Secondary | ICD-10-CM | POA: Diagnosis not present

## 2023-12-18 LAB — CBC WITH DIFFERENTIAL (CANCER CENTER ONLY)
Abs Immature Granulocytes: 0.01 K/uL (ref 0.00–0.07)
Basophils Absolute: 0 K/uL (ref 0.0–0.1)
Basophils Relative: 1 %
Eosinophils Absolute: 0.1 K/uL (ref 0.0–0.5)
Eosinophils Relative: 3 %
HCT: 41.6 % (ref 39.0–52.0)
Hemoglobin: 14 g/dL (ref 13.0–17.0)
Immature Granulocytes: 0 %
Lymphocytes Relative: 36 %
Lymphs Abs: 1.6 K/uL (ref 0.7–4.0)
MCH: 32.3 pg (ref 26.0–34.0)
MCHC: 33.7 g/dL (ref 30.0–36.0)
MCV: 95.9 fL (ref 80.0–100.0)
Monocytes Absolute: 0.5 K/uL (ref 0.1–1.0)
Monocytes Relative: 11 %
Neutro Abs: 2.2 K/uL (ref 1.7–7.7)
Neutrophils Relative %: 49 %
Platelet Count: 181 K/uL (ref 150–400)
RBC: 4.34 MIL/uL (ref 4.22–5.81)
RDW: 12.5 % (ref 11.5–15.5)
WBC Count: 4.5 K/uL (ref 4.0–10.5)
nRBC: 0 % (ref 0.0–0.2)

## 2023-12-18 LAB — CMP (CANCER CENTER ONLY)
ALT: 17 U/L (ref 0–44)
AST: 12 U/L — ABNORMAL LOW (ref 15–41)
Albumin: 3.7 g/dL (ref 3.5–5.0)
Alkaline Phosphatase: 104 U/L (ref 38–126)
Anion gap: 9 (ref 5–15)
BUN: 24 mg/dL — ABNORMAL HIGH (ref 8–23)
CO2: 24 mmol/L (ref 22–32)
Calcium: 10.6 mg/dL — ABNORMAL HIGH (ref 8.9–10.3)
Chloride: 109 mmol/L (ref 98–111)
Creatinine: 0.92 mg/dL (ref 0.61–1.24)
GFR, Estimated: >60 mL/min (ref >60)
Glucose, Bld: 94 mg/dL (ref 70–99)
Potassium: 3.9 mmol/L (ref 3.5–5.1)
Sodium: 141 mmol/L (ref 135–145)
Total Bilirubin: 0.4 mg/dL (ref 0.0–1.2)
Total Protein: 5.9 g/dL — ABNORMAL LOW (ref 6.5–8.1)

## 2023-12-18 LAB — LACTATE DEHYDROGENASE: LDH: 146 U/L (ref 105–235)

## 2023-12-18 LAB — TSH: TSH: 1.34 u[IU]/mL (ref 0.350–4.500)

## 2023-12-18 MED ORDER — SODIUM CHLORIDE 0.9 % IV SOLN: Freq: Once | INTRAVENOUS | Status: AC

## 2023-12-18 MED ORDER — SODIUM CHLORIDE 0.9 % IV SOLN
480.0000 mg | Freq: Once | INTRAVENOUS | Status: AC
  Administered 2023-12-18: 480 mg via INTRAVENOUS
  Filled 2023-12-18: qty 48

## 2023-12-18 NOTE — Progress Notes (Signed)
 [facility] Follow Up Visit  Francis Cunningham [id number] 17-Aug-1950 73 y.o. 12/18/2023   Principle Diagnosis:  Metastatic renal cell carcinoma-bone only metastasis  Current Therapy:   Maintenance nivolumab -Q 6-week dosing -- s/p cycle #27 --changed to 6-week dosing on 04/03/2022     Interim History:  Francis Cunningham is back for follow-up.  As always, he comes in with his wife.  We always have a very nice conversation.  Hello bit of difficulty with physical therapy recently.  He did a little bit of too much stretching.  He had an x-ray that was done because of pain.  Of course, the radiologist cannot totally discount a neoplastic process.  He is due for a PET scan in December.  We will see what the PET scan has to show.  Otherwise, he is doing okay.  Is eating well.  Is having a problem with nausea or vomiting.  Has had no change in bowel or bladder habits.  There is been no cough.  He has had no headache.  His last TSH was 1.33.  He has had no bleeding.  He has had no fever.  He will need ocular surgery.  He apparently is going had a vitrectomy done.  I think he will have this done for both eyes.  I think the first surgery will be in early December.  The second 1 will be 2 weeks after.  Overall, I will say that his performance status is probably ECOG 1.   Medications:  Current Outpatient Medications:    acetaminophen  (TYLENOL ) 325 MG tablet, Take 325 mg by mouth every 6 (six) hours as needed., Disp: , Rfl:    alfuzosin  (UROXATRAL ) 10 MG 24 hr tablet, Take 10 mg by mouth at bedtime., Disp: , Rfl:    ascorbic acid (VITAMIN C) 500 MG tablet, Take 500 mg by mouth in the morning., Disp: , Rfl:    Cholecalciferol 25 MCG (1000 UT) tablet, Take 2,000 Units by mouth daily., Disp: , Rfl:    escitalopram  (LEXAPRO ) 20 MG tablet, Take 20 mg by mouth daily., Disp: , Rfl:    finasteride  (PROSCAR ) 5 MG tablet, Take 5 mg by mouth daily., Disp: , Rfl:    guaiFENesin (MUCINEX) 600  MG 12 hr tablet, Take 600 mg by mouth in the morning and at bedtime., Disp: , Rfl:    Hypertonic Nasal Wash (SINUS RINSE) PACK, Irrigate with as directed., Disp: , Rfl:    LORazepam  (ATIVAN ) 1 MG tablet, Take 2 tablets (2 mg total) by mouth as needed. Ona tablest as needed for procedures and imaging, Disp: 30 tablet, Rfl: 0   Magnesium Carbonate POWD, Take 325 mg by mouth daily. 1 1/2  teaspoons daily., Disp: , Rfl:    melatonin 3 MG TABS tablet, Take 3 mg by mouth at bedtime., Disp: , Rfl:    midodrine  (PROAMATINE ) 2.5 MG tablet, TAKE ONE (1) TABLET BY MOUTH 3 TIMES DAILY, Disp: 90 tablet, Rfl: 8   montelukast  (SINGULAIR ) 10 MG tablet, Take 1 tablet by mouth daily., Disp: , Rfl:    Multiple Vitamin (MULTI-VITAMINS) TABS, Take 1 tablet by mouth daily., Disp: , Rfl:    nivolumab  (OPDIVO ) 100 MG/10ML SOLN chemo injection, Inject into the vein., Disp: , Rfl:    Omega-3 Fatty Acids (FISH OIL PO), Take 5 mLs by mouth daily., Disp: , Rfl:    polyethylene glycol powder (GLYCOLAX /MIRALAX ) 17 GM/SCOOP powder, Take 17 g by mouth at bedtime., Disp: , Rfl:  Potassium Citrate-Citric Acid 3300-1002 MG PACK, Take by mouth daily., Disp: , Rfl:    predniSONE  (DELTASONE ) 10 MG tablet, Take 0.5 tablets (5 mg total) by mouth daily., Disp: 90 tablet, Rfl: 6   Probiotic Product (PROBIOTIC ACIDOPHILUS BEADS PO), Take by mouth daily., Disp: , Rfl:    vitamin E 180 MG (400 UNITS) capsule, Take 400 Units by mouth every morning., Disp: , Rfl:   Allergies:  Allergies  Allergen Reactions   Morphine Nausea Only   Xgeva [Denosumab] Other (See Comments)    Necrosis of the jaw, mouth sores    Past Medical History, Surgical history, Social history, and Family History were reviewed and updated.  Review of Systems: Review of Systems  Constitutional: Negative.   HENT:   Positive for mouth sores.   Eyes: Negative.   Respiratory: Negative.    Cardiovascular: Negative.   Gastrointestinal: Negative.   Endocrine:  Negative.   Genitourinary: Negative.    Musculoskeletal:  Positive for arthralgias and back pain.  Skin: Negative.   Neurological: Negative.   Hematological: Negative.   Psychiatric/Behavioral: Negative.      Physical Exam: Temperature is 98.2.  Pulse 74.  Blood pressure 96/57.  Weight is 230 pounds.        Wt Readings from Last 3 Encounters:  11/07/23 230 lb 7 oz (104.5 kg)  09/25/23 230 lb 12.8 oz (104.7 kg)  08/12/23 230 lb (104.3 kg)    Physical Exam Vitals reviewed.  HENT:     Head: Normocephalic and atraumatic.  Eyes:     Pupils: Pupils are equal, round, and reactive to light.  Cardiovascular:     Rate and Rhythm: Normal rate and regular rhythm.     Heart sounds: Normal heart sounds.  Pulmonary:     Effort: Pulmonary effort is normal.     Breath sounds: Normal breath sounds.  Abdominal:     General: Bowel sounds are normal.     Palpations: Abdomen is soft.  Musculoskeletal:        General: No tenderness or deformity. Normal range of motion.     Cervical back: Normal range of motion.  Lymphadenopathy:     Cervical: No cervical adenopathy.  Skin:    General: Skin is warm and dry.     Findings: No erythema or rash.  Neurological:     Mental Status: He is alert and oriented to person, place, and time.  Psychiatric:        Behavior: Behavior normal.        Thought Content: Thought content normal.        Judgment: Judgment normal.      Lab Results  Component Value Date   WBC 4.5 11/07/2023   HGB 14.0 11/07/2023   HCT 41.5 11/07/2023   MCV 95.8 11/07/2023   PLT 167 11/07/2023     Chemistry      Component Value Date/Time   NA 142 11/07/2023 0808   K 4.0 11/07/2023 0808   CL 109 11/07/2023 0808   CO2 22 11/07/2023 0808   BUN 24 (H) 11/07/2023 0808   CREATININE 0.87 11/07/2023 0808      Component Value Date/Time   CALCIUM 10.6 (H) 11/07/2023 0808   ALKPHOS 105 11/07/2023 0808   AST 14 (L) 11/07/2023 0808   ALT 22 11/07/2023 0808   BILITOT 0.4  11/07/2023 0808      Impression and Plan: Francis Cunningham is a very nice 73 year old white male.  He has metastatic renal cell carcinoma.  He is on immunotherapy.  He is done incredibly well with immunotherapy.  We will see what his PET scan shows.  I cannot imagine that any problems with the right hip are going to be malignant.  I know that he and his wife however wonderful Thanksgiving together.  I see no prior with him having a vitrectomy.  I do not see any contraindication with him being on immunotherapy.  As always, we will plan to get him back in 6 weeks.     11/19/20258:13 AM

## 2023-12-18 NOTE — Patient Instructions (Signed)
 [facility] - A DEPT OF [facility]  Discharge Instructions: Thank you for choosing [facility] to provide your oncology and hematology care.   If you have a lab appointment with the Cancer Center, please go directly to the Cancer Center and check in at the registration area.  Wear comfortable clothing and clothing appropriate for easy access to any Portacath or PICC line.   We strive to give you quality time with your provider. You may need to reschedule your appointment if you arrive late (15 or more minutes).  Arriving late affects you and other patients whose appointments are after yours.  Also, if you miss three or more appointments without notifying the office, you may be dismissed from the clinic at the provider's discretion.      For prescription refill requests, have your pharmacy contact our office and allow 72 hours for refills to be completed.    Today you received the following chemotherapy and/or immunotherapy agents Opdivo       To help prevent nausea and vomiting after your treatment, we encourage you to take your nausea medication as directed.  BELOW ARE SYMPTOMS THAT SHOULD BE REPORTED IMMEDIATELY: *FEVER GREATER THAN 100.4 F (38 C) OR HIGHER *CHILLS OR SWEATING *NAUSEA AND VOMITING THAT IS NOT CONTROLLED WITH YOUR NAUSEA MEDICATION *UNUSUAL SHORTNESS OF BREATH *UNUSUAL BRUISING OR BLEEDING *URINARY PROBLEMS (pain or burning when urinating, or frequent urination) *BOWEL PROBLEMS (unusual diarrhea, constipation, pain near the anus) TENDERNESS IN MOUTH AND THROAT WITH OR WITHOUT PRESENCE OF ULCERS (sore throat, sores in mouth, or a toothache) UNUSUAL RASH, SWELLING OR PAIN  UNUSUAL VAGINAL DISCHARGE OR ITCHING   Items with * indicate a potential emergency and should be followed up as soon as possible or go to the Emergency Department if any problems should occur.  Please show the CHEMOTHERAPY ALERT CARD or IMMUNOTHERAPY  ALERT CARD at check-in to the Emergency Department and triage nurse. Should you have questions after your visit or need to cancel or reschedule your appointment, please contact [facility] - A DEPT OF [facility]  [phone] and follow the prompts.  Office hours are 8:00 a.m. to 4:30 p.m. Monday - Friday. Please note that voicemails left after 4:00 p.m. may not be returned until the following business day.  We are closed weekends and major holidays. You have access to a nurse at all times for urgent questions. Please call the main number to the clinic [phone] and follow the prompts.  For any non-urgent questions, you may also contact your provider using MyChart. We now offer e-Visits for anyone 77 and older to request care online for non-urgent symptoms. For details visit [email or web address].   Also download the MyChart app! Go to the app store, search "MyChart", open the app, select [facility], and log in with your MyChart username and password.

## 2023-12-19 ENCOUNTER — Other Ambulatory Visit: Payer: Self-pay

## 2023-12-19 LAB — T4: T4, Total: 4.4 ug/dL — ABNORMAL LOW (ref 4.5–12.0)

## 2024-01-08 ENCOUNTER — Encounter: Payer: Self-pay | Admitting: Hematology & Oncology

## 2024-01-09 ENCOUNTER — Encounter (HOSPITAL_COMMUNITY)
Admission: RE | Admit: 2024-01-09 | Discharge: 2024-01-09 | Disposition: A | Source: Ambulatory Visit | Attending: Hematology & Oncology | Admitting: Hematology & Oncology

## 2024-01-09 ENCOUNTER — Telehealth: Payer: Self-pay | Admitting: Hematology & Oncology

## 2024-01-09 DIAGNOSIS — C641 Malignant neoplasm of right kidney, except renal pelvis: Secondary | ICD-10-CM

## 2024-01-09 LAB — GLUCOSE, CAPILLARY: Glucose-Capillary: 90 mg/dL (ref 70–99)

## 2024-01-09 MED ORDER — FLUDEOXYGLUCOSE F - 18 (FDG) INJECTION
11.4000 | Freq: Once | INTRAVENOUS | Status: AC | PRN
  Administered 2024-01-09: 11.4 via INTRAVENOUS

## 2024-01-09 NOTE — Telephone Encounter (Signed)
 Called to r/s upcoming appts. LVM to return call for scheduling.

## 2024-01-12 ENCOUNTER — Other Ambulatory Visit: Payer: Self-pay

## 2024-01-13 ENCOUNTER — Ambulatory Visit: Payer: Self-pay | Admitting: Hematology & Oncology

## 2024-01-15 ENCOUNTER — Inpatient Hospital Stay: Admitting: Hematology & Oncology

## 2024-01-15 ENCOUNTER — Inpatient Hospital Stay

## 2024-01-29 ENCOUNTER — Inpatient Hospital Stay: Attending: Hematology & Oncology

## 2024-01-29 ENCOUNTER — Inpatient Hospital Stay

## 2024-01-29 ENCOUNTER — Encounter: Payer: Self-pay | Admitting: Hematology & Oncology

## 2024-01-29 ENCOUNTER — Inpatient Hospital Stay (HOSPITAL_BASED_OUTPATIENT_CLINIC_OR_DEPARTMENT_OTHER): Admitting: Hematology & Oncology

## 2024-01-29 VITALS — BP 98/64 | HR 78 | Temp 97.9°F | Resp 20 | Ht 75.0 in | Wt 232.4 lb

## 2024-01-29 VITALS — BP 103/76 | HR 60

## 2024-01-29 DIAGNOSIS — Z79620 Long term (current) use of immunosuppressive biologic: Secondary | ICD-10-CM | POA: Insufficient documentation

## 2024-01-29 DIAGNOSIS — C7951 Secondary malignant neoplasm of bone: Secondary | ICD-10-CM | POA: Insufficient documentation

## 2024-01-29 DIAGNOSIS — R7303 Prediabetes: Secondary | ICD-10-CM

## 2024-01-29 DIAGNOSIS — C641 Malignant neoplasm of right kidney, except renal pelvis: Secondary | ICD-10-CM

## 2024-01-29 DIAGNOSIS — E041 Nontoxic single thyroid nodule: Secondary | ICD-10-CM | POA: Diagnosis not present

## 2024-01-29 DIAGNOSIS — Z5112 Encounter for antineoplastic immunotherapy: Secondary | ICD-10-CM | POA: Diagnosis present

## 2024-01-29 LAB — CBC WITH DIFFERENTIAL (CANCER CENTER ONLY)
Abs Immature Granulocytes: 0.01 K/uL (ref 0.00–0.07)
Basophils Absolute: 0 K/uL (ref 0.0–0.1)
Basophils Relative: 1 %
Eosinophils Absolute: 0.1 K/uL (ref 0.0–0.5)
Eosinophils Relative: 3 %
HCT: 42.1 % (ref 39.0–52.0)
Hemoglobin: 14.2 g/dL (ref 13.0–17.0)
Immature Granulocytes: 0 %
Lymphocytes Relative: 35 %
Lymphs Abs: 1.8 K/uL (ref 0.7–4.0)
MCH: 32.3 pg (ref 26.0–34.0)
MCHC: 33.7 g/dL (ref 30.0–36.0)
MCV: 95.9 fL (ref 80.0–100.0)
Monocytes Absolute: 0.6 K/uL (ref 0.1–1.0)
Monocytes Relative: 11 %
Neutro Abs: 2.6 K/uL (ref 1.7–7.7)
Neutrophils Relative %: 50 %
Platelet Count: 174 K/uL (ref 150–400)
RBC: 4.39 MIL/uL (ref 4.22–5.81)
RDW: 12.7 % (ref 11.5–15.5)
WBC Count: 5.2 K/uL (ref 4.0–10.5)
nRBC: 0 % (ref 0.0–0.2)

## 2024-01-29 LAB — CMP (CANCER CENTER ONLY)
ALT: 21 U/L (ref 0–44)
AST: 14 U/L — ABNORMAL LOW (ref 15–41)
Albumin: 3.9 g/dL (ref 3.5–5.0)
Alkaline Phosphatase: 118 U/L (ref 38–126)
Anion gap: 8 (ref 5–15)
BUN: 25 mg/dL — ABNORMAL HIGH (ref 8–23)
CO2: 25 mmol/L (ref 22–32)
Calcium: 10.8 mg/dL — ABNORMAL HIGH (ref 8.9–10.3)
Chloride: 108 mmol/L (ref 98–111)
Creatinine: 0.87 mg/dL (ref 0.61–1.24)
GFR, Estimated: >60 mL/min
Glucose, Bld: 90 mg/dL (ref 70–99)
Potassium: 4.3 mmol/L (ref 3.5–5.1)
Sodium: 141 mmol/L (ref 135–145)
Total Bilirubin: 0.4 mg/dL (ref 0.0–1.2)
Total Protein: 5.9 g/dL — ABNORMAL LOW (ref 6.5–8.1)

## 2024-01-29 LAB — TSH: TSH: 1.86 u[IU]/mL (ref 0.350–4.500)

## 2024-01-29 LAB — LACTATE DEHYDROGENASE: LDH: 164 U/L (ref 105–235)

## 2024-01-29 MED ORDER — SODIUM CHLORIDE 0.9 % IV SOLN: Freq: Once | INTRAVENOUS | Status: AC

## 2024-01-29 MED ORDER — SODIUM CHLORIDE 0.9 % IV SOLN
480.0000 mg | Freq: Once | INTRAVENOUS | Status: AC
  Administered 2024-01-29: 480 mg via INTRAVENOUS
  Filled 2024-01-29: qty 48

## 2024-01-29 NOTE — Patient Instructions (Signed)
 [facility]  Discharge Instructions: Thank you for choosing [facility] to provide your oncology and hematology care.   If you have a lab appointment with the Cancer Center, please go directly to the Cancer Center and check in at the registration area.  Wear comfortable clothing and clothing appropriate for easy access to any Portacath or PICC line.   We strive to give you quality time with your provider. You may need to reschedule your appointment if you arrive late (15 or more minutes).  Arriving late affects you and other patients whose appointments are after yours.  Also, if you miss three or more appointments without notifying the office, you may be dismissed from the clinic at the provider's discretion.      For prescription refill requests, have your pharmacy contact our office and allow 72 hours for refills to be completed.    Today you received the following chemotherapy and/or immunotherapy agents Nivolumab    To help prevent nausea and vomiting after your treatment, we encourage you to take your nausea medication as directed.  BELOW ARE SYMPTOMS THAT SHOULD BE REPORTED IMMEDIATELY: *FEVER GREATER THAN 100.4 F (38 C) OR HIGHER *CHILLS OR SWEATING *NAUSEA AND VOMITING THAT IS NOT CONTROLLED WITH YOUR NAUSEA MEDICATION *UNUSUAL SHORTNESS OF BREATH *UNUSUAL BRUISING OR BLEEDING *URINARY PROBLEMS (pain or burning when urinating, or frequent urination) *BOWEL PROBLEMS (unusual diarrhea, constipation, pain near the anus) TENDERNESS IN MOUTH AND THROAT WITH OR WITHOUT PRESENCE OF ULCERS (sore throat, sores in mouth, or a toothache) UNUSUAL RASH, SWELLING OR PAIN  UNUSUAL VAGINAL DISCHARGE OR ITCHING   Items with * indicate a potential emergency and should be followed up as soon as possible or go to the Emergency Department if any problems should occur.  Please show the CHEMOTHERAPY ALERT CARD or IMMUNOTHERAPY  ALERT CARD at check-in to the Emergency Department and triage nurse. Should you have questions after your visit or need to cancel or reschedule your appointment, please contact [facility] - A DEPT OF [facility]  [phone] and follow the prompts.  Office hours are 8:00 a.m. to 4:30 p.m. Monday - Friday. Please note that voicemails left after 4:00 p.m. may not be returned until the following business day.  We are closed weekends and major holidays. You have access to a nurse at all times for urgent questions. Please call the main number to the clinic [phone] and follow the prompts.  For any non-urgent questions, you may also contact your provider using MyChart. We now offer e-Visits for anyone 14 and older to request care online for non-urgent symptoms. For details visit [email or web address].   Also download the MyChart app! Go to the app store, search "MyChart", open the app, select [place], and log in with your MyChart username and password.

## 2024-01-29 NOTE — Progress Notes (Signed)
 [facility] Follow Up Visit  Zerrick Hanssen III [id number] [birth year] 73 y.o. 01/29/2024   Principle Diagnosis:  Metastatic renal cell carcinoma-bone only metastasis  Current Therapy:   Maintenance nivolumab -Q 6-week dosing -- s/p cycle #28 --changed to 6-week dosing on 04/03/2022     Interim History:  Mr. Rams III is back for follow-up.  He has PET scan done on 01/09/2024.  This showed that he still was in remission.  We do not see anything on the PET scan that look like there was cancer.  On the top of his right foot, at the base of the big toe, looks like he may have a bony cyst.  I told him that a podiatrist might be able to help him out.  He has had no problems with cough or shortness of breath.  He has had no change in bowel or bladder habits.  He has not yet had eye surgery.  Apparently, this will be done in mid January.  He has had no cough.  He has had no headache.  He has had no issues with COVID over the Flu.  Currently, his appetite is doing quite well.  He has had no nausea or vomiting.  He and his wife had a very nice Christmas.  His last TSH was 1.3 back in November.  Currently, his performance status is ECOG 1.   Medications:  Current Outpatient Medications:    acetaminophen  (TYLENOL ) 325 MG tablet, Take 325 mg by mouth every 6 (six) hours as needed., Disp: , Rfl:    alfuzosin  (UROXATRAL ) 10 MG 24 hr tablet, Take 10 mg by mouth at bedtime., Disp: , Rfl:    ascorbic acid (VITAMIN C) 500 MG tablet, Take 500 mg by mouth in the morning., Disp: , Rfl:    Cholecalciferol 25 MCG (1000 UT) tablet, Take 2,000 Units by mouth daily., Disp: , Rfl:    escitalopram  (LEXAPRO ) 20 MG tablet, Take 20 mg by mouth daily., Disp: , Rfl:    finasteride  (PROSCAR ) 5 MG tablet, Take 5 mg by mouth daily., Disp: , Rfl:    guaiFENesin (MUCINEX) 600 MG 12 hr tablet, Take 600 mg by mouth in the morning and at bedtime., Disp: , Rfl:    LORazepam  (ATIVAN ) 1 MG tablet, Take  2 tablets (2 mg total) by mouth as needed. Ona tablest as needed for procedures and imaging, Disp: 30 tablet, Rfl: 0   Magnesium Carbonate POWD, Take 325 mg by mouth daily. 1 1/2  teaspoons daily., Disp: , Rfl:    melatonin 3 MG TABS tablet, Take 3 mg by mouth at bedtime., Disp: , Rfl:    midodrine  (PROAMATINE ) 2.5 MG tablet, TAKE ONE (1) TABLET BY MOUTH 3 TIMES DAILY, Disp: 90 tablet, Rfl: 8   montelukast  (SINGULAIR ) 10 MG tablet, Take 1 tablet by mouth daily., Disp: , Rfl:    Multiple Vitamin (MULTI-VITAMINS) TABS, Take 1 tablet by mouth daily., Disp: , Rfl:    nivolumab  (OPDIVO ) 100 MG/10ML SOLN chemo injection, Inject into the vein., Disp: , Rfl:    polyethylene glycol powder (GLYCOLAX /MIRALAX ) 17 GM/SCOOP powder, Take 17 g by mouth at bedtime., Disp: , Rfl:    Potassium Citrate-Citric Acid 3300-1002 MG PACK, Take by mouth daily., Disp: , Rfl:    predniSONE  (DELTASONE ) 10 MG tablet, Take 0.5 tablets (5 mg total) by mouth daily. (Patient taking differently: Take 10 mg by mouth daily.), Disp: 90 tablet, Rfl: 6   Probiotic Product (PROBIOTIC ACIDOPHILUS BEADS PO), Take by  mouth daily., Disp: , Rfl:    vitamin E 180 MG (400 UNITS) capsule, Take 400 Units by mouth every morning., Disp: , Rfl:    Hypertonic Nasal Wash (SINUS RINSE) PACK, Irrigate with as directed. (Patient not taking: Reported on 01/29/2024), Disp: , Rfl:    Omega-3 Fatty Acids (FISH OIL PO), Take 5 mLs by mouth daily. (Patient not taking: Reported on 01/29/2024), Disp: , Rfl:   Allergies:  Allergies  Allergen Reactions   Xgeva [Denosumab] Other (See Comments)    Necrosis of the jaw, mouth sores   Morphine Nausea Only    Past Medical History, Surgical history, Social history, and Family History were reviewed and updated.  Review of Systems: Review of Systems  Constitutional: Negative.   HENT:   Positive for mouth sores.   Eyes: Negative.   Respiratory: Negative.    Cardiovascular: Negative.   Gastrointestinal: Negative.    Endocrine: Negative.   Genitourinary: Negative.    Musculoskeletal:  Positive for arthralgias and back pain.  Skin: Negative.   Neurological: Negative.   Hematological: Negative.   Psychiatric/Behavioral: Negative.      Physical Exam: Temperature is 97.9.  Pulse 78.  Blood pressure 98/64.  Weight is 232 pounds.         Wt Readings from Last 3 Encounters:  12/18/23 230 lb (104.3 kg)  11/07/23 230 lb 7 oz (104.5 kg)  09/25/23 230 lb 12.8 oz (104.7 kg)    Physical Exam Vitals reviewed.  HENT:     Head: Normocephalic and atraumatic.  Eyes:     Pupils: Pupils are equal, round, and reactive to light.  Cardiovascular:     Rate and Rhythm: Normal rate and regular rhythm.     Heart sounds: Normal heart sounds.  Pulmonary:     Effort: Pulmonary effort is normal.     Breath sounds: Normal breath sounds.  Abdominal:     General: Bowel sounds are normal.     Palpations: Abdomen is soft.  Musculoskeletal:        General: No tenderness or deformity. Normal range of motion.     Cervical back: Normal range of motion.     Comments: On the dorsum of the right foot, at the metatarsal phalangeal joint, it looks like there might be a bony cyst.  This might be a Morton's neuroma.  Lymphadenopathy:     Cervical: No cervical adenopathy.  Skin:    General: Skin is warm and dry.     Findings: No erythema or rash.  Neurological:     Mental Status: He is alert and oriented to person, place, and time.  Psychiatric:        Behavior: Behavior normal.        Thought Content: Thought content normal.        Judgment: Judgment normal.      Lab Results  Component Value Date   WBC 5.2 01/29/2024   HGB 14.2 01/29/2024   HCT 42.1 01/29/2024   MCV 95.9 01/29/2024   PLT 174 01/29/2024     Chemistry      Component Value Date/Time   NA 141 12/18/2023 0801   K 3.9 12/18/2023 0801   CL 109 12/18/2023 0801   CO2 24 12/18/2023 0801   BUN 24 (H) 12/18/2023 0801   CREATININE 0.92 12/18/2023  0801      Component Value Date/Time   CALCIUM 10.6 (H) 12/18/2023 0801   ALKPHOS 104 12/18/2023 0801   AST 12 (L) 12/18/2023 0801  ALT 17 12/18/2023 0801   BILITOT 0.4 12/18/2023 0801      Impression and Plan: [name] is a very nice 73 year old white male.  He has metastatic renal cell carcinoma.  He is on immunotherapy.  He is done incredibly well with immunotherapy.  So far, everything is going quite well for him.  Very happy for him.  He really has done very nicely.  I do not think we need another PET scan probably until March or April.  We will continue him on every 6-week therapy.  If everything looks good on the next PET scan in the Spring, then we will move his treatments out to every 2 months.  12/31/20258:10 AM

## 2024-01-30 LAB — T4: T4, Total: 4.6 ug/dL (ref 4.5–12.0)

## 2024-01-31 ENCOUNTER — Other Ambulatory Visit: Payer: Self-pay

## 2024-02-04 ENCOUNTER — Encounter: Payer: Self-pay | Admitting: Hematology & Oncology

## 2024-03-01 ENCOUNTER — Other Ambulatory Visit: Payer: Self-pay

## 2024-03-03 ENCOUNTER — Ambulatory Visit: Admitting: Podiatry

## 2024-03-12 ENCOUNTER — Inpatient Hospital Stay

## 2024-03-12 ENCOUNTER — Inpatient Hospital Stay: Attending: Hematology & Oncology

## 2024-03-12 ENCOUNTER — Inpatient Hospital Stay: Admitting: Hematology & Oncology
